# Patient Record
Sex: Male | Born: 1940 | Race: White | Hispanic: No | Marital: Married | Smoking: Former smoker
Health system: Southern US, Community
[De-identification: ages and names within clinical notes are randomized; demographics above are authoritative.]

## PROBLEM LIST (undated history)

## (undated) DIAGNOSIS — R519 Headache, unspecified: Secondary | ICD-10-CM

## (undated) DIAGNOSIS — K219 Gastro-esophageal reflux disease without esophagitis: Secondary | ICD-10-CM

## (undated) DIAGNOSIS — Z972 Presence of dental prosthetic device (complete) (partial): Secondary | ICD-10-CM

## (undated) DIAGNOSIS — IMO0002 Reserved for concepts with insufficient information to code with codable children: Secondary | ICD-10-CM

## (undated) DIAGNOSIS — Z87442 Personal history of urinary calculi: Secondary | ICD-10-CM

## (undated) DIAGNOSIS — K5900 Constipation, unspecified: Secondary | ICD-10-CM

## (undated) DIAGNOSIS — Z87438 Personal history of other diseases of male genital organs: Secondary | ICD-10-CM

## (undated) DIAGNOSIS — M1990 Unspecified osteoarthritis, unspecified site: Secondary | ICD-10-CM

## (undated) DIAGNOSIS — I499 Cardiac arrhythmia, unspecified: Secondary | ICD-10-CM

## (undated) DIAGNOSIS — R51 Headache: Secondary | ICD-10-CM

## (undated) DIAGNOSIS — F4310 Post-traumatic stress disorder, unspecified: Secondary | ICD-10-CM

## (undated) HISTORY — PX: KNEE ARTHROSCOPY: SUR90

## (undated) HISTORY — PX: CATARACT EXTRACTION, BILATERAL: SHX1313

## (undated) HISTORY — PX: COLONOSCOPY: SHX174

---

## 1969-01-09 DIAGNOSIS — R69 Illness, unspecified: Secondary | ICD-10-CM | POA: Insufficient documentation

## 1975-01-10 HISTORY — PX: INGUINAL HERNIA REPAIR: SHX194

## 1975-01-10 HISTORY — PX: APPENDECTOMY: SHX54

## 1975-01-10 HISTORY — PX: CHOLECYSTECTOMY: SHX55

## 1997-05-14 ENCOUNTER — Other Ambulatory Visit: Admission: RE | Admit: 1997-05-14 | Discharge: 1997-05-14 | Payer: Self-pay | Admitting: Orthopedic Surgery

## 1998-07-28 ENCOUNTER — Other Ambulatory Visit: Admission: RE | Admit: 1998-07-28 | Discharge: 1998-07-28 | Payer: Self-pay | Admitting: Urology

## 1998-10-05 ENCOUNTER — Ambulatory Visit (HOSPITAL_COMMUNITY): Admission: RE | Admit: 1998-10-05 | Discharge: 1998-10-05 | Payer: Self-pay | Admitting: *Deleted

## 1999-03-23 ENCOUNTER — Other Ambulatory Visit: Admission: RE | Admit: 1999-03-23 | Discharge: 1999-03-23 | Payer: Self-pay | Admitting: Urology

## 1999-06-01 ENCOUNTER — Encounter: Admission: RE | Admit: 1999-06-01 | Discharge: 1999-06-01 | Payer: Self-pay | Admitting: Urology

## 1999-06-01 ENCOUNTER — Encounter: Payer: Self-pay | Admitting: Urology

## 2000-01-24 ENCOUNTER — Encounter: Admission: RE | Admit: 2000-01-24 | Discharge: 2000-01-24 | Payer: Self-pay | Admitting: Urology

## 2000-01-24 ENCOUNTER — Encounter: Payer: Self-pay | Admitting: Urology

## 2000-11-21 ENCOUNTER — Ambulatory Visit (HOSPITAL_COMMUNITY): Admission: RE | Admit: 2000-11-21 | Discharge: 2000-11-21 | Payer: Self-pay | Admitting: *Deleted

## 2000-11-21 ENCOUNTER — Encounter (INDEPENDENT_AMBULATORY_CARE_PROVIDER_SITE_OTHER): Payer: Self-pay | Admitting: *Deleted

## 2002-01-09 HISTORY — PX: CARDIAC CATHETERIZATION: SHX172

## 2002-08-21 ENCOUNTER — Inpatient Hospital Stay (HOSPITAL_COMMUNITY): Admission: AD | Admit: 2002-08-21 | Discharge: 2002-08-23 | Payer: Self-pay | Admitting: Cardiology

## 2008-02-03 ENCOUNTER — Ambulatory Visit (HOSPITAL_COMMUNITY): Admission: RE | Admit: 2008-02-03 | Discharge: 2008-02-03 | Payer: Self-pay | Admitting: Internal Medicine

## 2009-01-09 HISTORY — PX: CERVICAL FUSION: SHX112

## 2009-04-08 ENCOUNTER — Ambulatory Visit (HOSPITAL_COMMUNITY): Admission: RE | Admit: 2009-04-08 | Discharge: 2009-04-08 | Payer: Self-pay | Admitting: Internal Medicine

## 2009-05-05 ENCOUNTER — Encounter: Admission: RE | Admit: 2009-05-05 | Discharge: 2009-05-05 | Payer: Self-pay | Admitting: Neurosurgery

## 2009-05-13 ENCOUNTER — Inpatient Hospital Stay (HOSPITAL_COMMUNITY): Admission: RE | Admit: 2009-05-13 | Discharge: 2009-05-14 | Payer: Self-pay | Admitting: Neurosurgery

## 2009-05-27 ENCOUNTER — Encounter: Admission: RE | Admit: 2009-05-27 | Discharge: 2009-05-27 | Payer: Self-pay | Admitting: Neurosurgery

## 2009-08-27 ENCOUNTER — Ambulatory Visit (HOSPITAL_COMMUNITY): Admission: RE | Admit: 2009-08-27 | Discharge: 2009-08-27 | Payer: Self-pay | Admitting: Internal Medicine

## 2010-01-09 HISTORY — PX: SUPRAPUBIC PROSTATECTOMY: SUR1056

## 2010-02-02 LAB — CBC
HCT: 41.4 % (ref 39.0–52.0)
Hemoglobin: 15.1 g/dL (ref 13.0–17.0)
MCH: 34 pg (ref 26.0–34.0)
RBC: 4.44 MIL/uL (ref 4.22–5.81)
RDW: 12.1 % (ref 11.5–15.5)
WBC: 5.9 10*3/uL (ref 4.0–10.5)

## 2010-02-02 LAB — SURGICAL PCR SCREEN
MRSA, PCR: NEGATIVE
Staphylococcus aureus: POSITIVE — AB

## 2010-02-02 LAB — BASIC METABOLIC PANEL
Creatinine, Ser: 1.09 mg/dL (ref 0.4–1.5)
GFR calc Af Amer: >60 mL/min (ref >60)

## 2010-02-08 ENCOUNTER — Other Ambulatory Visit: Payer: Self-pay | Admitting: Urology

## 2010-02-08 ENCOUNTER — Inpatient Hospital Stay (HOSPITAL_COMMUNITY)
Admission: RE | Admit: 2010-02-08 | Discharge: 2010-02-10 | DRG: 708 | Disposition: A | Discharge status: Home / Self Care | Payer: MEDICARE | Attending: Urology | Admitting: Urology

## 2010-02-08 DIAGNOSIS — N320 Bladder-neck obstruction: Secondary | ICD-10-CM | POA: Diagnosis present

## 2010-02-08 DIAGNOSIS — G4730 Sleep apnea, unspecified: Secondary | ICD-10-CM | POA: Diagnosis present

## 2010-02-08 DIAGNOSIS — N419 Inflammatory disease of prostate, unspecified: Secondary | ICD-10-CM | POA: Diagnosis present

## 2010-02-08 DIAGNOSIS — R972 Elevated prostate specific antigen [PSA]: Secondary | ICD-10-CM | POA: Diagnosis present

## 2010-02-08 DIAGNOSIS — N529 Male erectile dysfunction, unspecified: Secondary | ICD-10-CM | POA: Diagnosis present

## 2010-02-08 DIAGNOSIS — N401 Enlarged prostate with lower urinary tract symptoms: Principal | ICD-10-CM | POA: Diagnosis present

## 2010-02-08 DIAGNOSIS — N3289 Other specified disorders of bladder: Secondary | ICD-10-CM | POA: Diagnosis not present

## 2010-02-08 DIAGNOSIS — K219 Gastro-esophageal reflux disease without esophagitis: Secondary | ICD-10-CM | POA: Diagnosis present

## 2010-02-08 LAB — TYPE AND SCREEN
ABO/RH(D): A NEG
Antibody Screen: NEGATIVE

## 2010-02-08 LAB — ABO/RH: ABO/RH(D): A NEG

## 2010-02-08 LAB — HEMOGLOBIN AND HEMATOCRIT, BLOOD
HCT: 33.5 % — ABNORMAL LOW (ref 39.0–52.0)
Hemoglobin: 12.1 g/dL — ABNORMAL LOW (ref 13.0–17.0)

## 2010-02-23 NOTE — Op Note (Signed)
Tommy Hobbs, DEBRUYNE NO.:  [id number]  MEDICAL RECORD NO.:  [id number]          PATIENT TYPE:  INP  LOCATION:  0005                         FACILITY:  [facility]  PHYSICIAN:  Excell Seltzer. Annabell Howells, M.D.    DATE OF BIRTH:  Mar 06, 1940  DATE OF PROCEDURE:  02/08/2010 DATE OF DISCHARGE:                              OPERATIVE REPORT   PROCEDURE:  Suprapubic prostatectomy.  PREOPERATIVE DIAGNOSIS:  Benign prostatic hypertrophy with bladder outlet obstruction.  POSTOPERATIVE DIAGNOSIS:  Benign prostatic hypertrophy with bladder outlet obstruction.  SURGEON:  Excell Seltzer. Annabell Howells, M.D.  ASSISTANT:  Tommy Hobbs. Tommy Hobbs, M.D.  ANESTHESIA:  General.  DRAIN:  24-French urethral Foley, 22-French suprapubic tube and 10- [place] flat Blake drain.  BLOOD LOSS:  450 cc.  SPECIMENS:  Prostate adenoma.  COUNTS:  Sponge, instrument and needle counts were correct.  COMPLICATIONS:  None.  INDICATIONS:  Tommy Hobbs is a 70 year old white male with BPH and bladder outlet obstruction as well as some chronic perineal pain.  In his last office visit in late December, his prostate symptom score was 31 and his residual urine was 370 cc with a prostate volume estimated at 147 cc.  This was despite finasteride and terazosin for his voiding symptoms.  After reviewing the options, it was felt that an open simple prostatectomy was indicated.  FINDINGS IN PROCEDURE:  He was given Cipro.  He was taken to the operating room where he was placed in the supine position.  A general anesthetic was induced.  PAS hoses were placed.  His abdomen was clipped.  He was prepped with Betadine solution and draped in usual sterile fashion.  A 24-French urethral Foley was then inserted.  The bladder was filled with approximately 200 cc of sterile fluid.  A lower midline incision was made with a knife.  This was carried down through the subcutaneous and fascial layers using the Bovie. Transversalis fascia was incised  and the anterior bladder wall was identified.  A Bookwalter retractor was placed to aid exposure.  An anterior midline cystotomy was created and the bladder was drained.  The Foley catheter was removed.  A sponge was placed in the dome of the bladder.  A sweetheart retractor was used to provide traction.  Lateral stitches with 0 chromic were used to provide further exposure.  Ureteral orifices were identified and were felt to be well away from the bladder neck.  The Bovie cautery was then used to incise the mucosa around the prostatic adenoma and to avoid tearing into the trigone.  At this point, a finger was placed in the prostatic urethra and the adenoma was cracked and then bluntly dissected away from the prostatic capsule.  The muscular mucosal attachments were incised with Bovie as required.  Once the right and left adenoma had been removed, some residual bladder neck adenoma was removed as well as some residual right lateral adenoma.  At this point, it was felt that the adenoma had been adequately removed and we then moved toward hemostasis.  The bladder neck incision was oversewn from approximately 4 to 8 o'clock using running 2-0 Vicryl with care  being taken to avoid ureteral orifices which were inspected and were effluxing clear urine after we removed the adenoma.  Additional 2-0 chromic figure-of-eight sutures were used to control points of bleeding within the prostatic fossa.  However, bleeding was actually fairly light for this type of procedure.  Once adequate hemostasis had been achieved, the 24-French Foley catheter was reinserted per urethra.  The balloon was filled with 30 cc of sterile fluid and actually pulled into the prostatic fossa to provide additional tamponade.  At this point, final inspection of the ureteral orifices revealed further efflux of clear urine.  A 22-French Foley catheter was placed through a separate stab wound to the left of the incision to be  used as a suprapubic tube.  The anterior cystotomy was then closed with an initial mucosal layer of 2-0 chromic with the suprapubic tube being placed prior to completion of the closure.  The balloon was filled with 30 cc of sterile fluid.  A second seromuscular layer was then placed using 2-0 chromic.  Once the cystotomy had been completed, the bladder was irrigated with initial bloody urine that cleared with irrigation.  The urethral catheter was clamped and filling was continued to check water tightness of the cystotomy closure.  No leakage was identified.  The urethral catheter was placed to straight drainage, and the suprapubic catheter was eventually placed to continuous irrigation.  A #10 flat Blake drain was placed through separate stab wound to the right of the incision in positioned in the retropubic space.  The anterior rectus fascia was then closed using a running #1 PDS.  The subcutaneous tissues were irrigated, and the skin was closed with clips.  The drain and suprapubic tube were secured with 3-0 Nylon sutures and a dressing was applied.  The patient's anesthetic was reversed.  He was moved to the recovery room in stable condition.  The blood loss was 450 cc.  Sponge, instrument and needle counts were correct.  There were no complications.     Excell Seltzer. Annabell Howells, M.D.     JJW/MEDQ  D:  02/08/2010  T:  02/08/2010  Job:  [id number]  cc:   Margaretmary Bayley, M.D. Fax: [phone]  Electronically Signed by Bjorn Pippin M.D. on 02/23/2010 09:25:07 AM

## 2010-03-29 LAB — BASIC METABOLIC PANEL
BUN: 12 mg/dL (ref 6–23)
CO2: 26 mEq/L (ref 19–32)
Calcium: 9.5 mg/dL (ref 8.4–10.5)
Chloride: 106 mEq/L (ref 96–112)
Creatinine, Ser: 1.12 mg/dL (ref 0.4–1.5)
GFR calc Af Amer: >60 mL/min (ref >60)
GFR calc non Af Amer: >60 mL/min (ref >60)
Glucose, Bld: 110 mg/dL — ABNORMAL HIGH (ref 70–99)
Potassium: 4.2 mEq/L (ref 3.5–5.1)
Sodium: 138 mEq/L (ref 135–145)

## 2010-03-29 LAB — CBC
HCT: 41.4 % (ref 39.0–52.0)
Hemoglobin: 14.5 g/dL (ref 13.0–17.0)
MCHC: 35 g/dL (ref 30.0–36.0)
MCV: 99.8 fL (ref 78.0–100.0)
Platelets: 191 10*3/uL (ref 150–400)
RBC: 4.15 MIL/uL — ABNORMAL LOW (ref 4.22–5.81)
RDW: 12.9 % (ref 11.5–15.5)
WBC: 5.7 10*3/uL (ref 4.0–10.5)

## 2010-03-29 LAB — SURGICAL PCR SCREEN
MRSA, PCR: NEGATIVE
Staphylococcus aureus: POSITIVE — AB

## 2010-05-27 NOTE — Discharge Summary (Signed)
NAME:  Tommy Hobbs, Tommy Hobbs                         ACCOUNT NO.:  [id number]   MEDICAL RECORD NO.:  [id number]                   PATIENT TYPE:  INP   LOCATION:  3728                                 FACILITY:  [facility]   PHYSICIAN:  Armanda Magic, M.D.                  DATE OF BIRTH:  April 30, 1940   DATE OF ADMISSION:  08/21/2002  DATE OF DISCHARGE:  08/23/2002                                 DISCHARGE SUMMARY   CONSULTANTS:  Doylene Canning. Ladona Ridgel, M.D., [facility].   PROCEDURE:  August 22, 2002:  Coronary angiography by Armanda Magic, M.D.  revealing normal coronary arteries, normal left ventricular function.   DISCHARGE DIAGNOSES:  1. Chest pain, atypical.  Not related to coronary artery disease.     a. Cardiac catheterization August 22, 2002 by Armanda Magic, M.D.        revealing no significant coronary artery disease, normal left        ventricular function.  2. Paroxysmal symptomatic tachy palpitations documented both narrow as well     as wide QRS rhythms.     a. Evaluation by Doylene Canning. Ladona Ridgel, M.D., [facility].  Felt        supraventricular tachycardia sometimes with and sometimes without        aberrant conduction, though could have both supraventricular        tachycardia as well as ventricular tachycardia, both of which have        been nonsustained.  Recommended initiation of beta blocker therapy.        If continues symptomatic palpitations despite beta blockers or is        unable to tolerate those drugs, then consideration of catheter        ablation would be an option.  3. Hyperlipidemia, recent diagnosis.  4. History of gastroesophageal reflux disease, Barrett's esophagus followed     by Althea Grimmer. Santogade, M.D. in the past.  5. Benign prostatic hypertrophy.   PLAN:  The patient discharged home in stable condition.   DISCHARGE MEDICATIONS:  1. Toprol XL 50 mg p.o. daily.  2. Enteric-coated aspirin 81 mg daily.  3. Lipitor 40 mg p.o. q.h.s.  4.  Aciphex 20 mg p.o. q.h.s.  5. Fish oil.  6. Saw palmetto.   ACTIVITY:  As tolerated.   DIET:  Avoid caffeine, antihistamines.   WOUND CARE:  May shower.   FOLLOWUPArmanda Magic, M.D. to be seen in our clinic in approximately two  weeks.  The patient to call [phone] to schedule appointment.   HISTORY OF PRESENT ILLNESS:  Tommy Hobbs is a 70 year old gentleman  without prior cardiac history who presented office referral to Armanda Magic,  M.D. for evaluation of palpitations and chest pain.   The patient had onset approximately two weeks prior to presentation of  palpitations while on prednisone therapy taper for poison ivy.  Palpitations  were without  lightheadedness and of variable duration, maximum of three to  four minutes with several bursts.  The patient with associated sharp chest  pain along sternum.  No associated symptoms.  Palpitations resolved once  prednisone taper completed but residual chest crushing/tightness for last  few days prior to presentation.  Holter monitor August 15, 2002 revealed  several episodes ectopy and arrhythmia, some PSVT and some nonsustained  ventricular tachycardia.  The patient was electively admitted for coronary  angiography.  This was accomplished by Armanda Magic, M.D. revealing no  significant coronary artery disease, normal LV function.  EP evaluation by  Doylene Canning. Ladona Ridgel, M.D. was retained with results as detailed above.   PAST MEDICAL HISTORY:  As above.   PAST SURGICAL HISTORY:  1. Cholecystectomy.  2. Appendectomy.  3. Hernia repair in 1977.  4. Knee surgery.   LABORATORY DATA:  WBC 8, hemoglobin 16.1, hematocrit 46.5, platelets  184,000.  PT 12.2, INR 0.9, PTT 26.  Sodium 139, potassium 3.8, chloride  104, CO2 30, glucose 98, BUN 11, creatinine 1.0, calcium 8.9, total protein  6.5, albumin 3.7.  LFTs within normal range with only slight elevation ALT  of 42.  Serial cardiac enzymes including CK-MB and troponin I were  negative  x3 sets.  TSH within normal range at 1.552.      Salomon Fick, N.P.                       Armanda Magic, M.D.    MES/MEDQ  D:  09/07/2002  T:  09/08/2002  Job:  [id number]   cc:   Meredith Staggers, M.D.  [address], [address]  [place]  [place] [address]  Fax: [phone]   Doylene Canning. Ladona Ridgel, M.D.

## 2010-05-27 NOTE — Consult Note (Signed)
NAME:  Tommy Hobbs, Tommy Hobbs                         ACCOUNT NO.:  [id number]   MEDICAL RECORD NO.:  [id number]                   PATIENT TYPE:  INP   LOCATION:  3728                                 FACILITY:  [facility]   PHYSICIAN:  Doylene Canning. Ladona Ridgel, M.D.               DATE OF BIRTH:  Jun 29, 1940   DATE OF CONSULTATION:  08/23/2002  DATE OF DISCHARGE:                                   CONSULTATION   REFERRING PHYSICIAN:  Armanda Magic, M.D.   INDICATIONS FOR CONSULTATION:  Evaluation of both wide and narrow QRS  tachycardias associated with chest pain.   HISTORY OF PRESENT ILLNESS:  The patient is a very pleasant 70 year old man  who recently developed tachy palpitations associated with chest pain in the  last year.  He was under increasing distress over the last several weeks as  a consequence of his wife being critically ill.  He developed a rash on his  arms and was treated with oral steroids.  He seemed to have increasing  palpitations and chest pain associated with this and a cardiac monitor was  worn which demonstrates recurrent episodes of both wide and narrow QRS  tachycardias.  These tend to start and stop suddenly.  They are associated  with chest tightness but no syncope or shortness of breath.  The longest  episode the patient states lasted approximately three minutes.  In the last  week, since he has been off his steroid, his symptoms have improved.  He is  admitted for additional evaluation of his chest pain and he subsequently  underwent catheterization demonstrating no coronary artery disease and  preserved left ventricular systolic function.  He is referred now for  additional evaluation and treatment.  Review of his telemetry strips  demonstrate recurrent episodes of what appears to be a narrow QRS  tachycardia at a cycle length of approximately 300 msec.  His wide QRS  tachycardia interestingly enough has cycle lengths of 320-340 msec.  At  times there is a transition from  wide to narrow QRS arrhythmias.  All the  above suggest bundle branch aberration as the cause of his wide QRS beats,  although we certainly cannot exclude nonsustained ventricular tachycardia.   PAST MEDICAL HISTORY:  1. Dyslipidemia.  2. Gastroesophageal reflux disease.  3. History of BPH.  4. He is status post cholecystectomy, appendectomy and hernia repair.   SOCIAL HISTORY:  The patient is married.  He denies tobacco or ethanol abuse  presently but prior to seven years ago, smoked 2-1/2 packs of cigarettes per  day.   FAMILY HISTORY:  Notable for mother dying at age 21 and father dying of  colon cancer, unknown causes.  He has a brother with hypertension.   REVIEW OF SYSTEMS:  CONSTITUTIONAL:  Notable for no fevers, chills, night  sweats, or weight changes.  HEENT:  He denies any vision or hearing  problems.  SKIN:  He had a skin rash as previously noted on both arms  thought to be secondary to contact dermatitis. CARDIOVASCULAR:  He notes  chest pain and shortness of breath and palpitations as previously described.  RESPIRATORY:  He denies cough, claudication, or wheezing.  He denies PND or  edema.  GENITOURINARY:  He denies dysuria, hematuria or nocturia.  NEUROLOGIC:  He denies any numbness, depression or anxiety.  He does have  some very mild weakness.  GI:  He denies nausea, vomiting, diarrhea, or  constipation.  The rest of the review of systems was negative.   PHYSICAL EXAMINATION:  GENERAL:  He is a pleasant, well-appearing middle-  aged man in no distress.  VITAL SIGNS:  Blood pressure 120/70,  pulse 78 and regular, respirations 18.  HEENT:  Normocephalic, atraumatic.  Pupils are equal and round.  Oropharynx  moist.  Sclerae anicteric.  NECK:  No jugular venous distension.  There was no thyromegaly.  The trachea  was midline.  LUNGS:  Clear bilaterally to auscultation.  There were no wheezes, rales or  rhonchi.  ABDOMEN:  Soft, nontender, nondistended.  There was no  organomegaly.  EXTREMITIES:  Demonstrated no cyanosis, clubbing or edema.  Pulses 2+ and  symmetrical.  No peripheral edema.   EKG:  Demonstrates normal sinus rhythm without evidence of ventricular  preexcitation.   IMPRESSION:  1. Paroxysmal symptomatic tachy palpitations consistent with documentation     of both narrow as well as wide QRS rhythms.  2. Chest pain associated with #1 with no obstruction coronary disease.   DISCUSSION:  As the patient has not been on medical therapy and temporarily  the symptoms were worse with prednisone, which he has now stopped, I think  low-dose beta blocker initially for management of his palpitations and  tachycardia make the most sense.  Most likely he has SVT sometimes with and  sometimes without aberrant conduction, although he could certainly have both  SVT as well as VT, both of which have been nonsustained.  I have recommended  that he start on his beta blocker therapy.  If he has symptomatic  palpitations despite beta blockers or is unable to tolerate these drugs,  then consideration of catheter ablation would be an option.                                               Doylene Canning. Ladona Ridgel, M.D.    GWT/MEDQ  D:  08/23/2002  T:  08/23/2002  Job:  [id number]   cc:   Armanda Magic, M.D.  [address], [address]  [place], [place] [address]  Fax: [phone]   Kathrine Cords, R.N. [facility]

## 2010-05-27 NOTE — Cardiovascular Report (Signed)
NAME:  Tommy Hobbs, NEIDERT NO.:  [id number]   MEDICAL RECORD NO.:  [id number]                   PATIENT TYPE:  INP   LOCATION:  3728                                 FACILITY:  [facility]   PHYSICIAN:  Armanda Magic, M.D.                  DATE OF BIRTH:  January 07, 1941   DATE OF PROCEDURE:  08/22/2002  DATE OF DISCHARGE:                              CARDIAC CATHETERIZATION   REFERRING PHYSICIAN:  Meredith Staggers, M.D.   PROCEDURES:  1. Left heart catheterization.  2. Coronary angiography.  3. Left ventriculography.   OPERATOR:  Armanda Magic, M.D.   INDICATIONS:  Ventricular tachycardia, SVT, and chest pain.   COMPLICATIONS:  None.   INTRAVENOUS MEDICATIONS:  None.   INTRAVENOUS ACCESS:  Via 6 French sheath, right femoral artery.   This is a very pleasant 70 year old white male with no previous cardiac  history, who has been having episodic chest pain along with palpitations,  which recently significantly increased after being put on steroids.  He wore  a Holter monitor, which revealed multiple episodes of SVT and wide-complex  tachycardia, and now he presents for cardiac catheterization to rule out  inducible ischemia.   The patient was brought to the cardiac catheterization in the fasting,  nonsedated state.  Informed consent was obtained.  The patient was connected  to continuous heart rate and pulse oximetry monitoring, intermittent blood  pressure monitoring.  The right groin was prepped and draped in a sterile  fashion.  Xylocaine 1% was used for local anesthesia.  Using the modified  Seldinger technique a 6  French sheath was placed in the right femoral  artery.  Under fluoroscopic guidance a 6 [place] JL4 catheter was placed in  the left coronary artery.  Multiple cine films were taken in the 30 degree  RAO and 40 degree LAO views.  This catheter was then exchanged out over a  guidewire for a 6 [place] JR4 catheter, which was placed under  fluoroscopic  guidance into the right coronary artery.  Multiple cine films were taken in  the 30 degree RAO, 40 degree LAO views.  This catheter was then exchanged  out over a guidewire for a 6 French angled pigtail catheter, which was  placed under fluoroscopic guidance into the left ventricular cavity.  Left  ventriculography was performed in a 30 degree RAO view using a total of 30  mL of contrast at  13 mL/sec.  At the end of the procedure the catheter was  pulled across the aortic valve.  The catheter was then removed over a  guidewire.  At the end of the procedure all sheaths were removed.  Manual  compression was performed until adequate hemostasis was obtained.  The  patient was transferred back to his room in stable condition.   RESULTS:  1. Left main coronary artery is widely patent and trifurcates into a left  anterior descending artery, ramus branch, and left circumflex.  2. The left anterior descending artery is widely patent throughout its     course to the apex and gives rise to one large diagonal branch, which is     widely patent.  The ramus is a smaller-caliber vessel but is widely     patent throughout its course and bifurcates distally to two daughter     vessels, which were widely patent.  3. The left circumflex traverses the AV groove and gives rise to one obtuse     marginal branch, all of which are widely patent.  4. The right coronary artery is widely patent throughout its course, giving     rise distally to a posterolateral branch and posterior descending artery,     both of which are widely patent.   Left ventriculography was performed in a 30 degree RAO view using a total of  30 mL of contrast at 13 mL/sec.  The aortic pressure is 112/67 mmHg.  Left  ventricular pressure was 124/2 mmHg with an LVEDP of 14.  Left ventricular  systolic function appeared normal and normal left ventricular cavity  dimensions.   ASSESSMENT:  1. Noncardiac chest pain with  negative cardiac enzymes.  2. Normal coronary arteries.  3. Wide complex tachycardia with supraventricular tachycardia.  Question     whether this is an accessory bypass tract with antegrade and retrograde     conduction over the accessory tract versus supraventricular tachycardia     and ventricular tachycardia versus supraventricular tachycardia with     narrow-complex as well as aberrantly-conducted QRS complexes.  4. Hyperlipidemia.   PLAN:  1. IV fluids hydration.  2. EP consult.  3. Will probably discharge home today pending the EP evaluation and follow     up with me in two weeks for groin check.                                               Armanda Magic, M.D.    TT/MEDQ  D:  08/22/2002  T:  08/22/2002  Job:  [id number]   cc:   Doylene Canning. Ladona Ridgel, M.D.   Meredith Staggers, M.D.  [address]  [place]  [place] [address]  Fax: [phone]

## 2010-09-28 ENCOUNTER — Emergency Department (HOSPITAL_COMMUNITY): Payer: No Typology Code available for payment source

## 2010-09-28 ENCOUNTER — Emergency Department (HOSPITAL_COMMUNITY)
Admission: EM | Admit: 2010-09-28 | Discharge: 2010-09-28 | Disposition: A | Discharge status: Home / Self Care | Payer: No Typology Code available for payment source | Attending: Surgery | Admitting: Surgery

## 2010-09-28 DIAGNOSIS — S8000XA Contusion of unspecified knee, initial encounter: Secondary | ICD-10-CM | POA: Insufficient documentation

## 2011-02-17 ENCOUNTER — Encounter (HOSPITAL_COMMUNITY): Payer: Self-pay

## 2011-02-17 ENCOUNTER — Emergency Department (HOSPITAL_COMMUNITY)
Admission: EM | Admit: 2011-02-17 | Discharge: 2011-02-17 | Disposition: A | Discharge status: Home / Self Care | Payer: Medicare Other | Attending: Emergency Medicine | Admitting: Emergency Medicine

## 2011-02-17 DIAGNOSIS — S60459A Superficial foreign body of unspecified finger, initial encounter: Secondary | ICD-10-CM | POA: Insufficient documentation

## 2011-02-17 DIAGNOSIS — M795 Residual foreign body in soft tissue: Secondary | ICD-10-CM | POA: Insufficient documentation

## 2011-02-17 DIAGNOSIS — IMO0002 Reserved for concepts with insufficient information to code with codable children: Secondary | ICD-10-CM | POA: Insufficient documentation

## 2011-02-17 DIAGNOSIS — T148XXA Other injury of unspecified body region, initial encounter: Secondary | ICD-10-CM

## 2011-02-17 DIAGNOSIS — Z1833 Retained wood fragments: Secondary | ICD-10-CM | POA: Insufficient documentation

## 2011-02-17 NOTE — ED Provider Notes (Signed)
History     CSN: [id number]  Arrival date & time 02/17/11  1601   First MD Initiated Contact with Patient 02/17/11 1619      No chief complaint on file.   (Consider location/radiation/quality/duration/timing/severity/associated sxs/prior treatment) HPI Comments: Patient states that he was working with wood today without gloves and states that he pulled up a board and a splinter went into his medial thumb - has tried to get this out and was unable.  Patient is a 71 y.o. male presenting with foreign body. The history is provided by the patient. No language interpreter was used.  Foreign Body  The current episode started less than 1 hour ago. Intake: right thumb. Suspected object: splinter of wood. The incident was witnessed. The incident was witnessed/reported by the patient. Pertinent negatives include no chest pain, no fever, no abdominal pain, no vomiting, no congestion, no drainage, no hearing loss, no nosebleeds, no sore throat, no trouble swallowing, no choking, no cough and no difficulty breathing. There were no sick contacts. He has received no recent medical care.    No past medical history on file.  No past surgical history on file.  No family history on file.  History  Substance Use Topics  . Smoking status: Not on file  . Smokeless tobacco: Not on file  . Alcohol Use: Not on file      Review of Systems  Constitutional: Negative for fever.  HENT: Negative for nosebleeds, congestion, sore throat and trouble swallowing.   Respiratory: Negative for cough and choking.   Cardiovascular: Negative for chest pain.  Gastrointestinal: Negative for vomiting and abdominal pain.  All other systems reviewed and are negative.    Allergies  Cocamidopropyl betaine; Nexium; and Tegopen  Home Medications   Current Outpatient Rx  Name Route Sig Dispense Refill  . ADULT MULTIVITAMIN W/MINERALS CH Oral Take 1 tablet by mouth daily.    . OMEGA-3-ACID ETHYL ESTERS 1 G PO CAPS  Oral Take 2 g by mouth 2 (two) times daily.    Marland Kitchen OMEPRAZOLE 20 MG PO CPDR Oral Take 20 mg by mouth daily.      BP 131/74  Pulse 80  Temp(Src) 98.1 F (36.7 C) (Oral)  Resp 14  SpO2 97%  Physical Exam  Nursing note and vitals reviewed. Constitutional: He is oriented to person, place, and time. He appears well-developed and well-nourished. No distress.  HENT:  Head: Normocephalic and atraumatic.  Right Ear: External ear normal.  Left Ear: External ear normal.  Nose: Nose normal.  Mouth/Throat: Oropharynx is clear and moist.  Eyes: Conjunctivae are normal. Pupils are equal, round, and reactive to light. No scleral icterus.  Neck: Normal range of motion. Neck supple.  Cardiovascular: Normal rate, regular rhythm and normal heart sounds.  Exam reveals no gallop and no friction rub.   No murmur heard. Pulmonary/Chest: Effort normal and breath sounds normal. No respiratory distress.  Abdominal: Soft. Bowel sounds are normal. He exhibits distension.  Musculoskeletal: Normal range of motion.  Neurological: He is alert and oriented to person, place, and time. No cranial nerve deficit.  Skin: Skin is warm and dry.       Small splinter in left medial thumb  Psychiatric: He has a normal mood and affect. His behavior is normal. Judgment and thought content normal.    ED Course  Procedures (including critical care time)  Labs Reviewed - No data to display No results found.  Injected 0.5cc 2% lidocaine without epi into splinter site, #11  blade used and small particles of splinter removed, patient tolerated well.  Foreign body in left thumb    MDM  Splinter removed - patient tolerated well.        Izola Price [place], [place] 02/17/11 1742  Izola Price. [place], [place] 02/17/11 1743

## 2011-02-19 NOTE — ED Provider Notes (Signed)
Medical screening examination/treatment/procedure(s) were performed by non-physician practitioner and as supervising physician I was immediately available for consultation/collaboration.   Forbes Cellar, MD 02/19/11 [phone]

## 2013-06-25 ENCOUNTER — Other Ambulatory Visit: Payer: Self-pay | Admitting: Orthopedic Surgery

## 2013-06-25 DIAGNOSIS — R102 Pelvic and perineal pain: Secondary | ICD-10-CM

## 2013-06-30 ENCOUNTER — Ambulatory Visit
Admission: RE | Admit: 2013-06-30 | Discharge: 2013-06-30 | Disposition: A | Discharge status: Home / Self Care | Payer: Medicare Other | Source: Ambulatory Visit | Attending: Orthopedic Surgery | Admitting: Orthopedic Surgery

## 2013-06-30 DIAGNOSIS — R102 Pelvic and perineal pain: Secondary | ICD-10-CM

## 2013-06-30 MED ORDER — IOHEXOL 300 MG/ML  SOLN
125.0000 mL | Freq: Once | INTRAMUSCULAR | Status: AC | PRN
  Administered 2013-06-30: 125 mL via INTRAVENOUS

## 2013-07-17 ENCOUNTER — Encounter (INDEPENDENT_AMBULATORY_CARE_PROVIDER_SITE_OTHER): Payer: Self-pay | Admitting: General Surgery

## 2013-07-17 ENCOUNTER — Ambulatory Visit (INDEPENDENT_AMBULATORY_CARE_PROVIDER_SITE_OTHER): Payer: Medicare Other | Admitting: General Surgery

## 2013-07-17 VITALS — BP 132/78 | HR 62 | Temp 97.5°F | Ht 72.0 in | Wt 195.0 lb

## 2013-07-17 DIAGNOSIS — K4090 Unilateral inguinal hernia, without obstruction or gangrene, not specified as recurrent: Secondary | ICD-10-CM

## 2013-07-17 NOTE — Progress Notes (Signed)
Patient ID: Tommy Hobbs, male   DOB: Dec 16, 1940, 73 y.o.   MRN: [id number]  Chief Complaint  Patient presents with  . eval hernia    HPI Tommy Hobbs is a 73 y.o. male.  Referred by Dr. Jenny Reichmann free for left inguinal hernia. [name] is his PCP.  The patient states that he has had left groin pain for 15 years. Doesn't feel a bulge. He says this feels just like the right inguinal hernia that he had repaired in 1977.  A CT scan shows small left inguinal hernia containing fat. Aortic calcifications. Evidence of previous TURP.  The patient has a history of retrograde TURP through the bladder in the year 2012. [place]. He did not have cancer but they could not do it retrograde transurethrally and had to make an incision in the lower abdomen and into the bladder to open things up. He has been better since then he's also had an open cholecystectomy. He has left shoulder problems. He has diastases recti. HPI  History reviewed. No pertinent past medical history.  History reviewed. No pertinent past surgical history.  History reviewed. No pertinent family history.  Social History History  Substance Use Topics  . Smoking status: Never Smoker   . Smokeless tobacco: Not on file  . Alcohol Use: No    Allergies  Allergen Reactions  . Cocamidopropyl Betaine Hives and Rash  . Esomeprazole Magnesium Hives and Rash  . Tegopen [Cloxacillin Sodium] Rash    Current Outpatient Prescriptions  Medication Sig Dispense Refill  . Multiple Vitamin (MULITIVITAMIN WITH MINERALS) TABS Take 1 tablet by mouth daily.      Marland Kitchen omega-3 acid ethyl esters (LOVAZA) 1 G capsule Take 2 g by mouth 2 (two) times daily.      Marland Kitchen omeprazole (PRILOSEC) 20 MG capsule Take 20 mg by mouth daily.       No current facility-administered medications for this visit.    Review of Systems Review of Systems  Constitutional: Negative for fever, chills and unexpected weight change.  HENT: Negative for congestion,  hearing loss, sore throat, trouble swallowing and voice change.   Eyes: Negative for visual disturbance.  Respiratory: Negative for cough and wheezing.   Cardiovascular: Negative for chest pain, palpitations and leg swelling.  Gastrointestinal: Negative for nausea, vomiting, abdominal pain, diarrhea, constipation, blood in stool, abdominal distention, anal bleeding and rectal pain.  Genitourinary: Positive for frequency. Negative for hematuria and difficulty urinating.  Musculoskeletal: Positive for arthralgias, myalgias and neck stiffness.  Skin: Negative for rash and wound.  Neurological: Negative for seizures, syncope, weakness and headaches.  Hematological: Negative for adenopathy. Does not bruise/bleed easily.  Psychiatric/Behavioral: Negative for confusion.    Blood pressure 132/78, pulse 62, temperature 97.5 F (36.4 C), height 6' (1.829 m), weight 195 lb (88.451 kg).  Physical Exam Physical Exam  Constitutional: He is oriented to person, place, and time. He appears well-developed and well-nourished. No distress.  Very pleasant. friendly  HENT:  Head: Normocephalic.  Nose: Nose normal.  Mouth/Throat: No oropharyngeal exudate.  Eyes: Conjunctivae and EOM are normal. Pupils are equal, round, and reactive to light. Right eye exhibits no discharge. Left eye exhibits no discharge. No scleral icterus.  Neck: Normal range of motion. Neck supple. No JVD present. No tracheal deviation present. No thyromegaly present.  Left neck incision from fusion surgery.  Cardiovascular: Normal rate, regular rhythm, normal heart sounds and intact distal pulses.   No murmur heard. Pulmonary/Chest: Effort normal and breath sounds normal. No  stridor. No respiratory distress. He has no wheezes. He has no rales. He exhibits no tenderness.  Abdominal: Soft. Bowel sounds are normal. He exhibits no distension and no mass. There is no tenderness. There is no rebound and no guarding.  Diastases recti. Pinpoint  umbilical hernia, nontender. Right subcostal incision healed. Lower midline incision healed. Cannot see a right groin incision.  Genitourinary:  With standing I can feel a small left inguinal hernia and he is reproducibly tender at that point. He says this is the same pain he has been having. No evidence of hernia on the right. No scrotal mass. Testes smooth but a little small.  Musculoskeletal: Normal range of motion. He exhibits no edema and no tenderness.  Lymphadenopathy:    He has no cervical adenopathy.  Neurological: He is alert and oriented to person, place, and time. He has normal reflexes. Coordination normal.  Skin: Skin is warm and dry. No rash noted. He is not diaphoretic. No erythema. No pallor.  Psychiatric: He has a normal mood and affect. His behavior is normal. Judgment and thought content normal.    Data Reviewed CT scan.  Assessment    Painful left inguinal hernia. Small. Contains fat only. Symptoms and physical exam correlates with CT scan findings  Remote history right inguinal hernia repair, and a recurrence  Status post open cholecystectomy  Status post open, retrograde prostate operation for BPH  GERD     Plan    At the patient's request, he will be scheduled for open repair of left inguinal hernia with mesh.  I discussed the indications, details, techniques, numerous risks of the surgery with him. He is aware of the risk of bleeding, infection, recurrence, injury to adjacent organs, nerve damage with chronic pain, and other understanding problems. He he understands all these issues and all his questions are ansswered.  He agrees with this plan.        Gisell Buehrle M 07/17/2013, 11:32 AM

## 2013-07-17 NOTE — Patient Instructions (Signed)
You have had left groin pain for many years. On physical exam and CT scan you have a small left inguinal hernia.  You'll be scheduled for elective repair of your left inguinal hernia with mesh.       Inguinal Hernia, Adult Muscles help keep everything in the body in its proper place. But if a weak spot in the muscles develops, something can poke through. That is called a hernia. When this happens in the lower part of the belly (abdomen), it is called an inguinal hernia. (It takes its name from a part of the body in this region called the inguinal canal.) A weak spot in the wall of muscles lets some fat or part of the small intestine bulge through. An inguinal hernia can develop at any age. Men get them more often than women. CAUSES  In adults, an inguinal hernia develops over time.  It can be triggered by:  Suddenly straining the muscles of the lower abdomen.  Lifting heavy objects.  Straining to have a bowel movement. Difficult bowel movements (constipation) can lead to this.  Constant coughing. This may be caused by smoking or lung disease.  Being overweight.  Being pregnant.  Working at a job that requires long periods of standing or heavy lifting.  Having had an inguinal hernia before. One type can be an emergency situation. It is called a strangulated inguinal hernia. It develops if part of the small intestine slips through the weak spot and cannot get back into the abdomen. The blood supply can be cut off. If that happens, part of the intestine may die. This situation requires emergency surgery. SYMPTOMS  Often, a small inguinal hernia has no symptoms. It is found when a healthcare provider does a physical exam. Larger hernias usually have symptoms.   In adults, symptoms may include:  A lump in the groin. This is easier to see when the person is standing. It might disappear when lying down.  In men, a lump in the scrotum.  Pain or burning in the groin. This occurs  especially when lifting, straining or coughing.  A dull ache or feeling of pressure in the groin.  Signs of a strangulated hernia can include:  A bulge in the groin that becomes very painful and tender to the touch.  A bulge that turns red or purple.  Fever, nausea and vomiting.  Inability to have a bowel movement or to pass gas. DIAGNOSIS  To decide if you have an inguinal hernia, a healthcare provider will probably do a physical examination.  This will include asking questions about any symptoms you have noticed.  The healthcare provider might feel the groin area and ask you to cough. If an inguinal hernia is felt, the healthcare provider may try to slide it back into the abdomen.  Usually no other tests are needed. TREATMENT  Treatments can vary. The size of the hernia makes a difference. Options include:  Watchful waiting. This is often suggested if the hernia is small and you have had no symptoms.  No medical procedure will be done unless symptoms develop.  You will need to watch closely for symptoms. If any occur, contact your healthcare provider right away.  Surgery. This is used if the hernia is larger or you have symptoms.  Open surgery. This is usually an outpatient procedure (you will not stay overnight in a hospital). An cut (incision) is made through the skin in the groin. The hernia is put back inside the abdomen. The weak  area in the muscles is then repaired by herniorrhaphy or hernioplasty. Herniorrhaphy: in this type of surgery, the weak muscles are sewn back together. Hernioplasty: a patch or mesh is used to close the weak area in the abdominal wall.  Laparoscopy. In this procedure, a surgeon makes small incisions. A thin tube with a tiny video camera (called a laparoscope) is put into the abdomen. The surgeon repairs the hernia with mesh by looking with the video camera and using two long instruments. HOME CARE INSTRUCTIONS   After surgery to repair an inguinal  hernia:  You will need to take pain medicine prescribed by your healthcare provider. Follow all directions carefully.  You will need to take care of the wound from the incision.  Your activity will be restricted for awhile. This will probably include no heavy lifting for several weeks. You also should not do anything too active for a few weeks. When you can return to work will depend on the type of job that you have.  During "watchful waiting" periods, you should:  Maintain a healthy weight.  Eat a diet high in fiber (fruits, vegetables and whole grains).  Drink plenty of fluids to avoid constipation. This means drinking enough water and other liquids to keep your urine clear or pale yellow.  Do not lift heavy objects.  Do not stand for long periods of time.  Quit smoking. This should keep you from developing a frequent cough. SEEK MEDICAL CARE IF:   A bulge develops in your groin area.  You feel pain, a burning sensation or pressure in the groin. This might be worse if you are lifting or straining.  You develop a fever of more than 100.5 F (38.1 C). SEEK IMMEDIATE MEDICAL CARE IF:   Pain in the groin increases suddenly.  A bulge in the groin gets bigger suddenly and does not go down.  For men, there is sudden pain in the scrotum. Or, the size of the scrotum increases.  A bulge in the groin area becomes red or purple and is painful to touch.  You have nausea or vomiting that does not go away.  You feel your heart beating much faster than normal.  You cannot have a bowel movement or pass gas.  You develop a fever of more than 102.0 F (38.9 C). Document Released: 05/14/2008 Document Revised: 03/20/2011 Document Reviewed: 05/14/2008 [facility] Patient Information 2015 [place], [place]. This information is not intended to replace advice given to you by your health care provider. Make sure you discuss any questions you have with your health care provider.

## 2013-07-28 ENCOUNTER — Ambulatory Visit (INDEPENDENT_AMBULATORY_CARE_PROVIDER_SITE_OTHER): Payer: Medicare Other | Admitting: General Surgery

## 2013-07-30 ENCOUNTER — Encounter (HOSPITAL_BASED_OUTPATIENT_CLINIC_OR_DEPARTMENT_OTHER): Payer: Self-pay | Admitting: *Deleted

## 2013-07-30 NOTE — Progress Notes (Signed)
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

## 2013-07-31 ENCOUNTER — Encounter (HOSPITAL_BASED_OUTPATIENT_CLINIC_OR_DEPARTMENT_OTHER)
Admission: RE | Admit: 2013-07-31 | Discharge: 2013-07-31 | Disposition: A | Discharge status: Home / Self Care | Payer: Medicare Other | Source: Ambulatory Visit | Attending: General Surgery | Admitting: General Surgery

## 2013-07-31 DIAGNOSIS — K4090 Unilateral inguinal hernia, without obstruction or gangrene, not specified as recurrent: Secondary | ICD-10-CM | POA: Diagnosis not present

## 2013-07-31 DIAGNOSIS — Z79899 Other long term (current) drug therapy: Secondary | ICD-10-CM | POA: Diagnosis not present

## 2013-07-31 DIAGNOSIS — Z9889 Other specified postprocedural states: Secondary | ICD-10-CM | POA: Diagnosis not present

## 2013-07-31 DIAGNOSIS — M6200 Separation of muscle (nontraumatic), unspecified site: Secondary | ICD-10-CM | POA: Diagnosis not present

## 2013-07-31 DIAGNOSIS — K219 Gastro-esophageal reflux disease without esophagitis: Secondary | ICD-10-CM | POA: Diagnosis not present

## 2013-07-31 DIAGNOSIS — Z87891 Personal history of nicotine dependence: Secondary | ICD-10-CM | POA: Diagnosis not present

## 2013-07-31 LAB — URINALYSIS, ROUTINE W REFLEX MICROSCOPIC
Bilirubin Urine: NEGATIVE
GLUCOSE, UA: NEGATIVE mg/dL
Hgb urine dipstick: NEGATIVE
Ketones, ur: NEGATIVE mg/dL
Leukocytes, UA: NEGATIVE
Nitrite: NEGATIVE
PH: 6.5 (ref 5.0–8.0)
PROTEIN: NEGATIVE mg/dL
Specific Gravity, Urine: 1.009 (ref 1.005–1.030)
Urobilinogen, UA: 1 mg/dL (ref 0.0–1.0)

## 2013-07-31 LAB — CBC WITH DIFFERENTIAL/PLATELET
BASOS ABS: 0 10*3/uL (ref 0.0–0.1)
Basophils Relative: 0 % (ref 0–1)
EOS PCT: 3 % (ref 0–5)
Eosinophils Absolute: 0.2 10*3/uL (ref 0.0–0.7)
HCT: 45.4 % (ref 39.0–52.0)
Hemoglobin: 15.9 g/dL (ref 13.0–17.0)
Lymphocytes Relative: 26 % (ref 12–46)
Lymphs Abs: 1.8 10*3/uL (ref 0.7–4.0)
MCH: 33.8 pg (ref 26.0–34.0)
MCHC: 35 g/dL (ref 30.0–36.0)
MCV: 96.4 fL (ref 78.0–100.0)
Monocytes Absolute: 0.7 10*3/uL (ref 0.1–1.0)
Monocytes Relative: 11 % (ref 3–12)
NEUTROS ABS: 4.1 10*3/uL (ref 1.7–7.7)
Neutrophils Relative %: 60 % (ref 43–77)
PLATELETS: 151 10*3/uL (ref 150–400)
RBC: 4.71 MIL/uL (ref 4.22–5.81)
RDW: 12.2 % (ref 11.5–15.5)
WBC: 6.9 10*3/uL (ref 4.0–10.5)

## 2013-07-31 LAB — COMPREHENSIVE METABOLIC PANEL
ALT: 50 U/L (ref 0–53)
AST: 25 U/L (ref 0–37)
Albumin: 3.8 g/dL (ref 3.5–5.2)
Alkaline Phosphatase: 77 U/L (ref 39–117)
Anion gap: 12 (ref 5–15)
BUN: 13 mg/dL (ref 6–23)
CALCIUM: 9.5 mg/dL (ref 8.4–10.5)
CHLORIDE: 97 meq/L (ref 96–112)
CO2: 26 mEq/L (ref 19–32)
Creatinine, Ser: 0.9 mg/dL (ref 0.50–1.35)
GFR calc Af Amer: >90 mL/min (ref >90)
GFR calc non Af Amer: 84 mL/min — ABNORMAL LOW (ref >90)
Glucose, Bld: 103 mg/dL — ABNORMAL HIGH (ref 70–99)
Potassium: 4.7 mEq/L (ref 3.7–5.3)
SODIUM: 135 meq/L — AB (ref 137–147)
Total Bilirubin: 0.7 mg/dL (ref 0.3–1.2)
Total Protein: 6.8 g/dL (ref 6.0–8.3)

## 2013-07-31 NOTE — H&P (Signed)
Tommy Hobbs   MRN:  [id number]   Description: 73 year old male  Provider: Adin Hector, MD  Department: [facility]         Diagnoses      Left inguinal hernia    -  Primary      550.90             Current Vitals Most recent update: 07/17/2013 11:31 AM by [place], CMA      BP Pulse Temp(Src) Ht Wt BMI      132/78 62 97.5 F (36.4 C) 6' (1.829 m) 195 lb (88.451 kg) 26.44 kg/m2           History and Physical    Adin Hector, MD    Status: Signed            Patient ID: Tommy Hobbs, male   DOB: 09/22/1940, 73 y.o.   MRN: [id number]              HPI Tommy Hobbs is a 73 y.o. male.  Referred by Dr. Jenny Hobbs free for left inguinal hernia. Tommy Hobbs is his PCP.   The patient states that he has had left groin pain for 15 years. Doesn't feel a bulge. He says this feels just like the right inguinal hernia that he had repaired in 1977.   A CT scan shows small left inguinal hernia containing fat. Aortic calcifications. Evidence of previous TURP.   The patient has a history of retrograde TURP through the bladder in the year 2012. [name] He has been better since then he's also had an open cholecystectomy. He has left shoulder problems. He has diastases recti. HPI   History reviewed. No pertinent past medical history.   History reviewed. No pertinent past surgical history.   History reviewed. No pertinent family history.   Social History History   Substance Use Topics   .  Smoking status:  Never Smoker    .  Smokeless tobacco:  Not on file   .  Alcohol Use:  No         Allergies   Allergen  Reactions   .  Cocamidopropyl Betaine  Hives and Rash   .  Esomeprazole Magnesium  Hives and Rash   .  Tegopen [Cloxacillin Sodium]  Rash         Current Outpatient  Prescriptions   Medication  Sig  Dispense  Refill   .  Multiple Vitamin (MULITIVITAMIN WITH MINERALS) TABS  Take 1 tablet by mouth daily.         Marland Kitchen  omega-3 acid ethyl esters (LOVAZA) 1 G capsule  Take 2 g by mouth 2 (two) times daily.         Marland Kitchen  omeprazole (PRILOSEC) 20 MG capsule  Take 20 mg by mouth daily.              Review of Systems  Constitutional: Negative for fever, chills and unexpected weight change.  HENT: Negative for congestion, hearing loss, sore throat, trouble swallowing and voice change.   Eyes: Negative for visual disturbance.  Respiratory: Negative for cough and wheezing.   Cardiovascular: Negative for chest pain, palpitations and leg swelling.  Gastrointestinal: Negative for nausea, vomiting, abdominal pain, diarrhea, constipation, blood in stool, abdominal distention,  anal bleeding and rectal pain.  Genitourinary: Positive for frequency. Negative for hematuria and difficulty urinating.  Musculoskeletal: Positive for arthralgias, myalgias and neck stiffness.  Skin: Negative for rash and wound.  Neurological: Negative for seizures, syncope, weakness and headaches.  Hematological: Negative for adenopathy. Does not bruise/bleed easily.  Psychiatric/Behavioral: Negative for confusion.      Blood pressure 132/78, pulse 62, temperature 97.5 F (36.4 C), height 6' (1.829 m), weight 195 lb (88.451 kg).   Physical Exam  Constitutional: He is oriented to person, place, and time. He appears well-developed and well-nourished. No distress.  Very pleasant. friendly  HENT:   Head: Normocephalic.   Nose: Nose normal.   Mouth/Throat: No oropharyngeal exudate.  Eyes: Conjunctivae and EOM are normal. Pupils are equal, round, and reactive to light. Right eye exhibits no discharge. Left eye exhibits no discharge. No scleral icterus.  Neck: Normal range of motion. Neck supple. No JVD present. No tracheal deviation present. No thyromegaly present.  Left neck incision from  fusion surgery.  Cardiovascular: Normal rate, regular rhythm, normal heart sounds and intact distal pulses.    No murmur heard. Pulmonary/Chest: Effort normal and breath sounds normal. No stridor. No respiratory distress. He has no wheezes. He has no rales. He exhibits no tenderness.  Abdominal: Soft. Bowel sounds are normal. He exhibits no distension and no mass. There is no tenderness. There is no rebound and no guarding.  Diastases recti. Pinpoint umbilical hernia, nontender. Right subcostal incision healed. Lower midline incision healed. Cannot see a right groin incision.  Genitourinary:  With standing I can feel a small left inguinal hernia and he is reproducibly tender at that point. He says this is the same pain he has been having. No evidence of hernia on the right. No scrotal mass. Testes smooth but a little small.  Musculoskeletal: Normal range of motion. He exhibits no edema and no tenderness.  Lymphadenopathy:    He has no cervical adenopathy.  Neurological: He is alert and oriented to person, place, and time. He has normal reflexes. Coordination normal.  Skin: Skin is warm and dry. No rash noted. He is not diaphoretic. No erythema. No pallor.  Psychiatric: He has a normal mood and affect. His behavior is normal. Judgment and thought content normal.      Data Reviewed CT scan.   Assessment    Painful left inguinal hernia. Small. Contains fat only. Symptoms and physical exam correlates with CT scan findings   Remote history right inguinal hernia repair, and a recurrence   Status post open cholecystectomy   Status post open, retrograde prostate operation for BPH   GERD      Plan    At the patient's request, he will be scheduled for open repair of left inguinal hernia with mesh.   I discussed the indications, details, techniques, numerous risks of the surgery with him. He is aware of the risk of bleeding, infection, recurrence, injury to adjacent organs, nerve  damage with chronic pain, and other understanding problems. He he understands all these issues and all his questions are ansswered.  He agrees with this plan.         Tommy Hobbs. Tommy Hobbs, M.D., [facility] General and Minimally invasive Surgery Breast and Colorectal Surgery Office:   [phone] Pager:   [phone]

## 2013-08-04 ENCOUNTER — Encounter (HOSPITAL_BASED_OUTPATIENT_CLINIC_OR_DEPARTMENT_OTHER): Payer: Self-pay | Admitting: *Deleted

## 2013-08-04 ENCOUNTER — Encounter (HOSPITAL_BASED_OUTPATIENT_CLINIC_OR_DEPARTMENT_OTHER): Payer: Medicare Other | Admitting: Anesthesiology

## 2013-08-04 ENCOUNTER — Ambulatory Visit (HOSPITAL_BASED_OUTPATIENT_CLINIC_OR_DEPARTMENT_OTHER)
Admission: RE | Admit: 2013-08-04 | Discharge: 2013-08-04 | Disposition: A | Discharge status: Home / Self Care | Payer: Medicare Other | Source: Ambulatory Visit | Attending: General Surgery | Admitting: General Surgery

## 2013-08-04 ENCOUNTER — Encounter (HOSPITAL_BASED_OUTPATIENT_CLINIC_OR_DEPARTMENT_OTHER)
Admission: RE | Disposition: A | Discharge status: Home / Self Care | Payer: Self-pay | Source: Ambulatory Visit | Attending: General Surgery

## 2013-08-04 ENCOUNTER — Ambulatory Visit (HOSPITAL_BASED_OUTPATIENT_CLINIC_OR_DEPARTMENT_OTHER): Payer: Medicare Other | Admitting: Anesthesiology

## 2013-08-04 ENCOUNTER — Telehealth (INDEPENDENT_AMBULATORY_CARE_PROVIDER_SITE_OTHER): Payer: Self-pay | Admitting: *Deleted

## 2013-08-04 DIAGNOSIS — Z9889 Other specified postprocedural states: Secondary | ICD-10-CM | POA: Insufficient documentation

## 2013-08-04 DIAGNOSIS — M6200 Separation of muscle (nontraumatic), unspecified site: Secondary | ICD-10-CM | POA: Diagnosis not present

## 2013-08-04 DIAGNOSIS — Z79899 Other long term (current) drug therapy: Secondary | ICD-10-CM | POA: Insufficient documentation

## 2013-08-04 DIAGNOSIS — K4090 Unilateral inguinal hernia, without obstruction or gangrene, not specified as recurrent: Secondary | ICD-10-CM | POA: Diagnosis present

## 2013-08-04 DIAGNOSIS — Z87891 Personal history of nicotine dependence: Secondary | ICD-10-CM | POA: Diagnosis not present

## 2013-08-04 DIAGNOSIS — K219 Gastro-esophageal reflux disease without esophagitis: Secondary | ICD-10-CM | POA: Insufficient documentation

## 2013-08-04 HISTORY — DX: Reserved for concepts with insufficient information to code with codable children: IMO0002

## 2013-08-04 HISTORY — PX: INGUINAL HERNIA REPAIR: SHX194

## 2013-08-04 HISTORY — DX: Personal history of other diseases of male genital organs: Z87.438

## 2013-08-04 HISTORY — DX: Gastro-esophageal reflux disease without esophagitis: K21.9

## 2013-08-04 HISTORY — DX: Presence of dental prosthetic device (complete) (partial): Z97.2

## 2013-08-04 HISTORY — DX: Unspecified osteoarthritis, unspecified site: M19.90

## 2013-08-04 HISTORY — PX: INSERTION OF MESH: SHX5868

## 2013-08-04 HISTORY — DX: Cardiac arrhythmia, unspecified: I49.9

## 2013-08-04 SURGERY — REPAIR, HERNIA, INGUINAL, ADULT
Anesthesia: Regional | Site: Groin

## 2013-08-04 MED ORDER — OXYCODONE-ACETAMINOPHEN 10-325 MG PO TABS: 1.0000 | ORAL_TABLET | ORAL | Status: DC | PRN

## 2013-08-04 MED ORDER — PROPOFOL 10 MG/ML IV BOLUS
INTRAVENOUS | Status: DC | PRN
  Administered 2013-08-04: 150 mg via INTRAVENOUS

## 2013-08-04 MED ORDER — HYDROMORPHONE HCL PF 1 MG/ML IJ SOLN
INTRAMUSCULAR | Status: AC
  Filled 2013-08-04: qty 1

## 2013-08-04 MED ORDER — CHLORHEXIDINE GLUCONATE 4 % EX LIQD: 1.0000 "application " | Freq: Once | CUTANEOUS | Status: DC

## 2013-08-04 MED ORDER — CEFAZOLIN SODIUM-DEXTROSE 2-3 GM-% IV SOLR
INTRAVENOUS | Status: AC
  Filled 2013-08-04: qty 50

## 2013-08-04 MED ORDER — EPHEDRINE SULFATE 50 MG/ML IJ SOLN
INTRAMUSCULAR | Status: DC | PRN
  Administered 2013-08-04: 15 mg via INTRAVENOUS
  Administered 2013-08-04: 10 mg via INTRAVENOUS

## 2013-08-04 MED ORDER — HYDROMORPHONE HCL PF 1 MG/ML IJ SOLN
0.2500 mg | INTRAMUSCULAR | Status: DC | PRN
  Administered 2013-08-04 (×4): 0.5 mg via INTRAVENOUS

## 2013-08-04 MED ORDER — LIDOCAINE HCL (CARDIAC) 20 MG/ML IV SOLN
INTRAVENOUS | Status: DC | PRN
  Administered 2013-08-04: 75 mg via INTRAVENOUS

## 2013-08-04 MED ORDER — FENTANYL CITRATE 0.05 MG/ML IJ SOLN
INTRAMUSCULAR | Status: AC
  Filled 2013-08-04: qty 2

## 2013-08-04 MED ORDER — MIDAZOLAM HCL 2 MG/2ML IJ SOLN
INTRAMUSCULAR | Status: AC
  Filled 2013-08-04: qty 2

## 2013-08-04 MED ORDER — BUPIVACAINE-EPINEPHRINE (PF) 0.5% -1:200000 IJ SOLN
INTRAMUSCULAR | Status: AC
  Filled 2013-08-04: qty 30

## 2013-08-04 MED ORDER — MIDAZOLAM HCL 2 MG/2ML IJ SOLN
1.0000 mg | INTRAMUSCULAR | Status: DC | PRN
Start: 2013-08-04 — End: 2013-08-04
  Administered 2013-08-04: 1 mg via INTRAVENOUS

## 2013-08-04 MED ORDER — BUPIVACAINE-EPINEPHRINE 0.5% -1:200000 IJ SOLN
INTRAMUSCULAR | Status: DC | PRN
  Administered 2013-08-04: 10 mL

## 2013-08-04 MED ORDER — OXYCODONE HCL 5 MG PO TABS
ORAL_TABLET | ORAL | Status: AC
  Filled 2013-08-04: qty 1

## 2013-08-04 MED ORDER — SODIUM BICARBONATE 4 % IV SOLN
INTRAVENOUS | Status: AC
  Filled 2013-08-04: qty 5

## 2013-08-04 MED ORDER — OXYCODONE HCL 5 MG PO TABS
5.0000 mg | ORAL_TABLET | Freq: Once | ORAL | Status: AC
Start: 2013-08-04 — End: 2013-08-04
  Administered 2013-08-04: 5 mg via ORAL

## 2013-08-04 MED ORDER — FENTANYL CITRATE 0.05 MG/ML IJ SOLN
50.0000 ug | INTRAMUSCULAR | Status: DC | PRN
  Administered 2013-08-04: 100 ug via INTRAVENOUS

## 2013-08-04 MED ORDER — FENTANYL CITRATE 0.05 MG/ML IJ SOLN
INTRAMUSCULAR | Status: AC
  Filled 2013-08-04: qty 6

## 2013-08-04 MED ORDER — LACTATED RINGERS IV SOLN
INTRAVENOUS | Status: DC
  Administered 2013-08-04: 10:00:00 via INTRAVENOUS

## 2013-08-04 MED ORDER — DEXAMETHASONE SODIUM PHOSPHATE 4 MG/ML IJ SOLN
INTRAMUSCULAR | Status: DC | PRN
  Administered 2013-08-04: 10 mg via INTRAVENOUS

## 2013-08-04 MED ORDER — ROPIVACAINE HCL 5 MG/ML IJ SOLN
INTRAMUSCULAR | Status: DC | PRN
  Administered 2013-08-04: 30 mL

## 2013-08-04 MED ORDER — CEFAZOLIN SODIUM-DEXTROSE 2-3 GM-% IV SOLR
2.0000 g | INTRAVENOUS | Status: AC
  Administered 2013-08-04: 2 g via INTRAVENOUS

## 2013-08-04 SURGICAL SUPPLY — 58 items
BENZOIN TINCTURE PRP APPL 2/3 (GAUZE/BANDAGES/DRESSINGS) IMPLANT
BLADE HEX COATED 2.75 (ELECTRODE) ×4 IMPLANT
BLADE SURG 10 STRL SS (BLADE) ×4 IMPLANT
BLADE SURG ROTATE 9660 (MISCELLANEOUS) ×4 IMPLANT
CANISTER SUCT 1200ML W/VALVE (MISCELLANEOUS) ×4 IMPLANT
CHLORAPREP W/TINT 26ML (MISCELLANEOUS) ×4 IMPLANT
CLOSURE WOUND 1/2 X4 (GAUZE/BANDAGES/DRESSINGS)
COVER MAYO STAND STRL (DRAPES) ×4 IMPLANT
COVER TABLE BACK 60X90 (DRAPES) ×4 IMPLANT
DECANTER SPIKE VIAL GLASS SM (MISCELLANEOUS) ×4 IMPLANT
DERMABOND ADVANCED (GAUZE/BANDAGES/DRESSINGS) ×2
DERMABOND ADVANCED .7 DNX12 (GAUZE/BANDAGES/DRESSINGS) ×2 IMPLANT
DRAIN PENROSE 1/2X12 LTX STRL (WOUND CARE) ×4 IMPLANT
DRAPE LAPAROTOMY TRNSV 102X78 (DRAPE) ×4 IMPLANT
DRAPE PED LAPAROTOMY (DRAPES) ×4 IMPLANT
DRAPE UTILITY XL STRL (DRAPES) ×4 IMPLANT
ELECT REM PT RETURN 9FT ADLT (ELECTROSURGICAL) ×4
ELECTRODE REM PT RTRN 9FT ADLT (ELECTROSURGICAL) ×2 IMPLANT
GLOVE EUDERMIC 7 POWDERFREE (GLOVE) ×4 IMPLANT
GOWN STRL REUS W/ TWL LRG LVL3 (GOWN DISPOSABLE) ×2 IMPLANT
GOWN STRL REUS W/ TWL XL LVL3 (GOWN DISPOSABLE) ×2 IMPLANT
GOWN STRL REUS W/TWL LRG LVL3 (GOWN DISPOSABLE) ×2
GOWN STRL REUS W/TWL XL LVL3 (GOWN DISPOSABLE) ×2
MESH ULTRAPRO 3X6 7.6X15CM (Mesh General) ×4 IMPLANT
NEEDLE HYPO 22GX1.5 SAFETY (NEEDLE) IMPLANT
NEEDLE HYPO 25X1 1.5 SAFETY (NEEDLE) ×4 IMPLANT
NS IRRIG 1000ML POUR BTL (IV SOLUTION) ×4 IMPLANT
PACK BASIN DAY SURGERY FS (CUSTOM PROCEDURE TRAY) ×4 IMPLANT
PENCIL BUTTON HOLSTER BLD 10FT (ELECTRODE) ×4 IMPLANT
SLEEVE SCD COMPRESS KNEE MED (MISCELLANEOUS) ×4 IMPLANT
SPONGE GAUZE 4X4 12PLY STER LF (GAUZE/BANDAGES/DRESSINGS) IMPLANT
SPONGE LAP 4X18 X RAY DECT (DISPOSABLE) ×4 IMPLANT
STAPLER VISISTAT 35W (STAPLE) IMPLANT
STRIP CLOSURE SKIN 1/2X4 (GAUZE/BANDAGES/DRESSINGS) IMPLANT
SUT MNCRL AB 4-0 PS2 18 (SUTURE) ×4 IMPLANT
SUT PROLENE 1 CT (SUTURE) IMPLANT
SUT PROLENE 2 0 CT2 30 (SUTURE) ×16 IMPLANT
SUT SILK 2 0 SH (SUTURE) IMPLANT
SUT SILK 2 0 TIES 17X18 (SUTURE) ×2
SUT SILK 2-0 18XBRD TIE BLK (SUTURE) ×2 IMPLANT
SUT SILK 3 0 SH 30 (SUTURE) IMPLANT
SUT VIC AB 2-0 CT1 27 (SUTURE)
SUT VIC AB 2-0 CT1 TAPERPNT 27 (SUTURE) IMPLANT
SUT VIC AB 2-0 SH 27 (SUTURE) ×2
SUT VIC AB 2-0 SH 27XBRD (SUTURE) ×2 IMPLANT
SUT VIC AB 3-0 54X BRD REEL (SUTURE) ×2 IMPLANT
SUT VIC AB 3-0 BRD 54 (SUTURE) ×2
SUT VIC AB 3-0 FS2 27 (SUTURE) IMPLANT
SUT VIC AB 3-0 SH 27 (SUTURE) ×2
SUT VIC AB 3-0 SH 27X BRD (SUTURE) ×2 IMPLANT
SUT VICRYL 3-0 CR8 SH (SUTURE) IMPLANT
SYRINGE 12CC LL (MISCELLANEOUS) ×4 IMPLANT
SYRINGE 20CC LL (MISCELLANEOUS) IMPLANT
TOWEL OR NON WOVEN STRL DISP B (DISPOSABLE) ×4 IMPLANT
TRAY DSU PREP LF (CUSTOM PROCEDURE TRAY) ×4 IMPLANT
TUBE CONNECTING 20'X1/4 (TUBING) ×1
TUBE CONNECTING 20X1/4 (TUBING) ×3 IMPLANT
YANKAUER SUCT BULB TIP NO VENT (SUCTIONS) ×4 IMPLANT

## 2013-08-04 NOTE — Anesthesia Procedure Notes (Addendum)
Anesthesia Regional Block:  TAP block  Pre-Anesthetic Checklist: ,, timeout performed, Correct Patient, Correct Site, Correct Laterality, Correct Procedure, Correct Position, site marked, Risks and benefits discussed, pre-op evaluation, post-op pain management  Laterality: Left  Prep: Maximum Sterile Barrier Precautions used and chloraprep       Needles:  Injection technique: Single-shot  Needle Type: Echogenic Stimulator Needle     Needle Length: 9cm 9 cm Needle Gauge: 21 and 21 G    Additional Needles:  Procedures: ultrasound guided (picture in chart) TAP block Narrative:  Start time: 08/04/2013 10:00 AM End time: 08/04/2013 10:14 AM Injection made incrementally with aspirations every 5 mL. Anesthesiologist: Fitzgerald,MD  Additional Notes: 2% Lidocaine skin wheel.   Procedure Name: LMA Insertion Date/Time: 08/04/2013 10:37 AM Performed by: Melynda Ripple D Pre-anesthesia Checklist: Patient identified, Emergency Drugs available, Suction available and Patient being monitored Patient Re-evaluated:Patient Re-evaluated prior to inductionOxygen Delivery Method: Circle System Utilized Preoxygenation: Pre-oxygenation with 100% oxygen Intubation Type: IV induction Ventilation: Mask ventilation without difficulty LMA: LMA inserted LMA Size: 5.0 Number of attempts: 1 Airway Equipment and Method: bite block Placement Confirmation: positive ETCO2 Tube secured with: Tape Dental Injury: Teeth and Oropharynx as per pre-operative assessment

## 2013-08-04 NOTE — Interval H&P Note (Signed)
History and Physical Interval Note:  08/04/2013 10:09 AM  Tommy Hobbs  has presented today for surgery, with the diagnosis of left inguinal hernia  The goals and the various methods of treatment have been discussed with the patient and family. After consideration of risks, benefits and other options for treatment, the patient has consented to  Procedure(s): OPEN LEFT INGUINAL HERNIA REPAIR (Left) INSERTION OF MESH (N/A) as a surgical intervention .  The patient's history has been reviewed, patient examined today, no change in status, stable for surgery.  I have reviewed the patient's chart and labs.  Questions were answered to the patient's satisfaction.     Adin Hector

## 2013-08-04 NOTE — Transfer of Care (Signed)
Immediate Anesthesia Transfer of Care Note  Patient: Tommy Hobbs  Procedure(s) Performed: Procedure(s): OPEN LEFT INGUINAL HERNIA REPAIR (Left) INSERTION OF MESH (N/A)  Patient Location: PACU  Anesthesia Type:General and Regional  Level of Consciousness: awake, alert  and oriented  Airway & Oxygen Therapy: Patient Spontanous Breathing and Patient connected to face mask oxygen  Post-op Assessment: Report given to PACU RN and Post -op Vital signs reviewed and stable  Post vital signs: Reviewed and stable  Complications: No apparent anesthesia complications

## 2013-08-04 NOTE — Telephone Encounter (Signed)
Pt's wife called wanting to know when her husband could drive.  He just had surgery this morning, hernia.  I advised her that as long as he is taking narcotic pain medication, that he could not drive.  She advised that she knew that, but he wanted her to call and ask.    Anderson Malta

## 2013-08-04 NOTE — Anesthesia Preprocedure Evaluation (Addendum)
Anesthesia Evaluation  Patient identified by MRN, date of birth, ID band Patient awake    Reviewed: Allergy & Precautions, H&P , NPO status , Patient's Chart, lab work & pertinent test results  Airway Mallampati: II TM Distance: >3 FB Neck ROM: Full    Dental no notable dental hx. (+) Upper Dentures, Dental Advisory Given   Pulmonary neg pulmonary ROS, former smoker,  breath sounds clear to auscultation  Pulmonary exam normal       Cardiovascular negative cardio ROS  Rhythm:Regular Rate:Normal     Neuro/Psych negative neurological ROS  negative psych ROS   GI/Hepatic Neg liver ROS, GERD-  Medicated,  Endo/Other  negative endocrine ROS  Renal/GU negative Renal ROS  negative genitourinary   Musculoskeletal   Abdominal   Peds  Hematology negative hematology ROS (+)   Anesthesia Other Findings   Reproductive/Obstetrics negative OB ROS                          Anesthesia Physical Anesthesia Plan  ASA: II  Anesthesia Plan: General and Regional   Post-op Pain Management:    Induction: Intravenous  Airway Management Planned: LMA  Additional Equipment:   Intra-op Plan:   Post-operative Plan: Extubation in OR  Informed Consent: I have reviewed the patients History and Physical, chart, labs and discussed the procedure including the risks, benefits and alternatives for the proposed anesthesia with the patient or authorized representative who has indicated his/her understanding and acceptance.   Dental advisory given  Plan Discussed with: CRNA  Anesthesia Plan Comments:         Anesthesia Quick Evaluation

## 2013-08-04 NOTE — Op Note (Signed)
Patient Name:           Tommy Hobbs   Date of Surgery:        08/04/2013  Note: This dictation was prepared with Dragon/digital dictation along with [facility] technology. Any transcriptional errors that result from this process are unintentional.    Pre op Diagnosis:      Left inguinal hernia  Post op Diagnosis:    Direct left inguinal hernia  Procedure:                 Open repair left inguinal hernia with mesh Karl Pock repair)  Surgeon:                     Edsel Petrin. Dalbert Batman, M.D., FACS  Assistant:                      None  Operative Indications:   Tommy Hobbs is a 73 y.o. Male.  [name] is his PCP.  The patient states that he has had left groin pain for 15 years. Doesn't feel a bulge. He says this feels just like the right inguinal hernia that he had repaired in 1977.  A CT scan shows small left inguinal hernia containing fat. Aortic calcifications. Evidence of previous TURP.  The patient has a history of retrograde TURP through the bladder in the year 2012. Tommy Hobbs. He did not have cancer but they could not do it retrograde transurethrally and had to make an incision in the lower abdomen and into the bladder to open things up. He has been better since then he's also had an open cholecystectomy. He has left shoulder problems. He has diastases recti. On exam, with the patient standing I can feel a small left inguinal hernia which is reproducibly tender. No hernia on the right side. No scrotal mass. Testes feel normal but small and somewhat atrophic.    Operative Findings:       Medium-sized direct left inguinal hernia. He had a small lipoma of the cord which was resected. I made a careful search for an indirect sac and found no indirect sac circumferentially. Cord structures were actually small and easily visualized.  Procedure in Detail:          The patient had a TAPP Block by Dr. Ola Spurr. That apparently went well. He was taken the operating room and  underwent general anesthesia with LMA device. The lower abdomen &  left groin were prepped and draped in a sterile fashion. Intravenous antibiotics were given. Surgical time out was performed. 0.5% Marcaine with epinephrine was used as local infiltration anesthetic and the skin and superficial subcutaneous tissue.      A transverse incision was made in the left groin overlying the left inguinal canal. Dissection was carried down through the subcutaneous tissue, exposing the aponeurosis of the external oblique. The external oblique was to incised in the direction of its fibers, opening up the external inguinal ring. The cord structures were mobilized and encircled with a Penrose drain. The ilioinguinal nerve was intimately associated with the cord. It was isolated, traced back laterally to its emergence from the muscles laterally, clamped divided and ligated with 2 silk ties. The redundant nerve medially was resected.      I skeletonized the cremasteric fibers. Isolated the lipoma and separated that from the cord structures  back to the internal ring. This was clamped, divided, and ligated with 3-0 Vicryl tie. I could see the direct hernia.  I carefully inspected the cord and there was no evidence of indirect hernia. The floor of the inguinal canal was repaired and reinforced with an onlay graft of ultra Pro mesh. A 3" x 6" piece of UltraPro  mesh was brought to the field and trimmed at the corners to accommodate the anatomy of the wound. The mesh was sutured in place with running sutures of 2-0 Prolene in interrupted mattress sutures of 2-0 Prolene. The mesh was sutured so as to generously overlap the fascia at the pubic tubercle, then up along the inguinal ligament inferiorly. Interrupted mattress sutures were placed medially, superiorly, and superior laterally to secure the mesh. The mesh was incised laterally so as to wraparound the cord structures at the internal ring. The tails of the mesh were overlapped   laterally and a few sutures were placed laterally. This provided a very secure repair and coverage both medial and lateral to the internal ring but allowed an adequate opening for the cord structures.      The wound was irrigated. Hemostasis was excellent. The external oblique was closed with a running suture of 2-0 Vicryl, placing the  the cord structures deep to the external oblique. Scarpa's fascia was closed with 3-0 Vicryl sutures and the skin closed with a running 4-0 Monocryl suture. Dermabond was placed. The patient was taken recovery room in stable condition. EBL 10 cc. Counts correct. Complications none.         Edsel Petrin. Dalbert Batman, M.D., FACS General and Minimally Invasive Surgery Breast and Colorectal Surgery  08/04/2013 11:49 AM

## 2013-08-04 NOTE — Discharge Instructions (Signed)
No lifting more than 15 pounds Be sure to drink lots of fluids  Make sure that you are able to void once or twice tonight  Walk as much as possible  You may shower, starting tomorrow.     [facility] _______[facility]  UMBILICAL OR INGUINAL HERNIA REPAIR: POST OP INSTRUCTIONS  Always review your discharge instruction sheet given to you by the facility where your surgery was performed. IF YOU HAVE DISABILITY OR FAMILY LEAVE FORMS, YOU MUST BRING THEM TO THE OFFICE FOR PROCESSING.   DO NOT GIVE THEM TO YOUR DOCTOR.  1. A  prescription for pain medication may be given to you upon discharge.  Take your pain medication as prescribed, if needed.  If narcotic pain medicine is not needed, then you may take acetaminophen (Tylenol) or ibuprofen (Advil) as needed. 2. Take your usually prescribed medications unless otherwise directed. 3. If you need a refill on your pain medication, please contact your pharmacy.  They will contact our office to request authorization. Prescriptions will not be filled after 5 pm or on week-ends. 4. You should follow a light diet the first 24 hours after arrival home, such as soup and crackers, etc.  Be sure to include lots of fluids daily.  Resume your normal diet the day after surgery. 5. Most patients will experience some swelling and bruising around the umbilicus or in the groin and scrotum.  Ice packs and reclining will help.  Swelling and bruising can take several days to resolve.  6. It is common to experience some constipation if taking pain medication after surgery.  Increasing fluid intake and taking a stool softener (such as Colace) will usually help or prevent this problem from occurring.  A mild laxative (Milk of Magnesia or Miralax) should be taken according to package directions if there are no bowel movements after 48 hours. 7. Unless discharge instructions indicate otherwise, you may remove your bandages 24-48 hours after surgery, and you may  shower at that time.  You may have steri-strips (small skin tapes) in place directly over the incision.  These strips should be left on the skin for 7-10 days.  If your surgeon used skin glue on the incision, you may shower in 24 hours.  The glue will flake off over the next 2-3 weeks.  Any sutures or staples will be removed at the office during your follow-up visit. 8. ACTIVITIES:  You may resume regular (light) daily activities beginning the next day--such as daily self-care, walking, climbing stairs--gradually increasing activities as tolerated.  You may have sexual intercourse when it is comfortable.  Refrain from any heavy lifting or straining until approved by your doctor. a. You may drive when you are no longer taking prescription pain medication, you can comfortably wear a seatbelt, and you can safely maneuver your car and apply brakes. b. RETURN TO WORK:  __________________________________________________________ 9. You should see your doctor in the office for a follow-up appointment approximately 2-3 weeks after your surgery.  Make sure that you call for this appointment within a day or two after you arrive home to insure a convenient appointment time. 10. OTHER INSTRUCTIONS:  __________________________________________________________________________________________________________________________________________________________________________________________  WHEN TO CALL YOUR DOCTOR: 1. Fever over 101.0 2. Inability to urinate 3. Nausea and/or vomiting 4. Extreme swelling or bruising 5. Continued bleeding from incision. 6. Increased pain, redness, or drainage from the incision  The clinic staff is available to answer your questions during regular business hours.  Please dont hesitate to call and ask  to speak to one of the nurses for clinical concerns.  If you have a medical emergency, go to the nearest emergency room or call 911.  A surgeon from [facility] is always on call  at the hospital   [address] [place], [place], [place]  [address] ?  [address], [place], [place]   [address] [phone] ? [phone] ? FAX [phone] Web site: [email or web address]   Post Anesthesia Home Care Instructions  Activity: Get plenty of rest for the remainder of the day. A responsible adult should stay with you for 24 hours following the procedure.  For the next 24 hours, DO NOT: -Drive a car -Paediatric nurse -Drink alcoholic beverages -Take any medication unless instructed by your physician -Make any legal decisions or sign important papers.  Meals: Start with liquid foods such as gelatin or soup. Progress to regular foods as tolerated. Avoid greasy, spicy, heavy foods. If nausea and/or vomiting occur, drink only clear liquids until the nausea and/or vomiting subsides. Call your physician if vomiting continues.  Special Instructions/Symptoms: Your throat may feel dry or sore from the anesthesia or the breathing tube placed in your throat during surgery. If this causes discomfort, gargle with warm salt water. The discomfort should disappear within 24 hours.

## 2013-08-04 NOTE — Progress Notes (Signed)
[facility] opt Assisted Dr. Ola Spurr with left, ultrasound guided, transabdominal plane block. Side rails up, monitors on throughout procedure. See vital signs in flow sheet. Tolerated Procedure well.

## 2013-08-04 NOTE — Anesthesia Postprocedure Evaluation (Signed)
  Anesthesia Post-op Note  Patient: Tommy Hobbs  Procedure(s) Performed: Procedure(s): OPEN LEFT INGUINAL HERNIA REPAIR (Left) INSERTION OF MESH (N/A)  Patient Location: PACU  Anesthesia Type:General and TAP block  Level of Consciousness: awake and alert   Airway and Oxygen Therapy: Patient Spontanous Breathing  Post-op Pain: moderate  Post-op Assessment: Post-op Vital signs reviewed, Patient's Cardiovascular Status Stable and Respiratory Function Stable  Post-op Vital Signs: Reviewed  Filed Vitals:   08/04/13 1230  BP: 143/68  Pulse: 66  Temp:   Resp: 15    Complications: No apparent anesthesia complications

## 2013-08-05 ENCOUNTER — Encounter (HOSPITAL_BASED_OUTPATIENT_CLINIC_OR_DEPARTMENT_OTHER): Payer: Self-pay | Admitting: General Surgery

## 2013-08-29 ENCOUNTER — Ambulatory Visit (INDEPENDENT_AMBULATORY_CARE_PROVIDER_SITE_OTHER): Payer: Medicare Other | Admitting: General Surgery

## 2013-08-29 ENCOUNTER — Encounter (INDEPENDENT_AMBULATORY_CARE_PROVIDER_SITE_OTHER): Payer: Self-pay | Admitting: General Surgery

## 2013-08-29 VITALS — BP 142/80 | HR 64 | Temp 98.0°F | Resp 14 | Ht 71.0 in | Wt 197.8 lb

## 2013-08-29 DIAGNOSIS — K4090 Unilateral inguinal hernia, without obstruction or gangrene, not specified as recurrent: Secondary | ICD-10-CM

## 2013-08-29 NOTE — Patient Instructions (Signed)
You are recovering from your left inguinal hernia repair without any obvious complications.  Try to walk more and more each day.  Avoid sports or heavy lifting until after Labor Day. No restriction of activities after that time  Return to see Dr. Dalbert Batman if necessary.

## 2013-08-29 NOTE — Progress Notes (Signed)
Patient ID: Tommy Hobbs, male   DOB: 07/30/1940, 73 y.o.   MRN: [id number] History: This gentleman underwent open repair of left inguinal hernia with mesh on 08/04/2013. He is here with his wife. He says he is doing very well. No problems with pain over the wound.  Exam: Patient looks well. No distress Left groin incision healing nicely. Tissues are soft. No fluid or infection. Hernia repair intact. Penis scrotum and testes feel normal.  Assessment: Left inguinal hernia, recovering uneventfully 3-1/2 weeks postop open repair with mesh  Plan:   diet and activities discussed. No sports or heavy lifting until after Labor Day Return to see me as needed.    Edsel Petrin. Dalbert Batman, M.D., [facility] General and Minimally invasive Surgery Breast and Colorectal Surgery Office:   [phone] Pager:   [phone]

## 2014-01-12 DIAGNOSIS — R1012 Left upper quadrant pain: Secondary | ICD-10-CM | POA: Diagnosis not present

## 2014-01-12 DIAGNOSIS — J40 Bronchitis, not specified as acute or chronic: Secondary | ICD-10-CM | POA: Diagnosis not present

## 2014-01-12 DIAGNOSIS — J449 Chronic obstructive pulmonary disease, unspecified: Secondary | ICD-10-CM | POA: Diagnosis not present

## 2014-01-27 DIAGNOSIS — J40 Bronchitis, not specified as acute or chronic: Secondary | ICD-10-CM | POA: Diagnosis not present

## 2014-01-27 DIAGNOSIS — R1012 Left upper quadrant pain: Secondary | ICD-10-CM | POA: Diagnosis not present

## 2014-01-27 DIAGNOSIS — J449 Chronic obstructive pulmonary disease, unspecified: Secondary | ICD-10-CM | POA: Diagnosis not present

## 2014-01-27 DIAGNOSIS — J029 Acute pharyngitis, unspecified: Secondary | ICD-10-CM | POA: Diagnosis not present

## 2014-01-28 DIAGNOSIS — H900 Conductive hearing loss, bilateral: Secondary | ICD-10-CM | POA: Diagnosis not present

## 2014-01-28 DIAGNOSIS — H6503 Acute serous otitis media, bilateral: Secondary | ICD-10-CM | POA: Diagnosis not present

## 2014-03-26 DIAGNOSIS — Z8719 Personal history of other diseases of the digestive system: Secondary | ICD-10-CM | POA: Diagnosis not present

## 2014-03-26 DIAGNOSIS — Z9889 Other specified postprocedural states: Secondary | ICD-10-CM | POA: Diagnosis not present

## 2014-03-26 DIAGNOSIS — R1030 Lower abdominal pain, unspecified: Secondary | ICD-10-CM | POA: Diagnosis not present

## 2014-03-26 DIAGNOSIS — Z9049 Acquired absence of other specified parts of digestive tract: Secondary | ICD-10-CM | POA: Diagnosis not present

## 2014-03-26 DIAGNOSIS — Z9079 Acquired absence of other genital organ(s): Secondary | ICD-10-CM | POA: Diagnosis not present

## 2014-03-31 DIAGNOSIS — J449 Chronic obstructive pulmonary disease, unspecified: Secondary | ICD-10-CM | POA: Diagnosis not present

## 2014-03-31 DIAGNOSIS — K209 Esophagitis, unspecified: Secondary | ICD-10-CM | POA: Diagnosis not present

## 2014-06-23 DIAGNOSIS — R1012 Left upper quadrant pain: Secondary | ICD-10-CM | POA: Diagnosis not present

## 2014-06-23 DIAGNOSIS — J029 Acute pharyngitis, unspecified: Secondary | ICD-10-CM | POA: Diagnosis not present

## 2014-06-23 DIAGNOSIS — J449 Chronic obstructive pulmonary disease, unspecified: Secondary | ICD-10-CM | POA: Diagnosis not present

## 2014-07-01 DIAGNOSIS — J029 Acute pharyngitis, unspecified: Secondary | ICD-10-CM | POA: Diagnosis not present

## 2014-07-01 DIAGNOSIS — J449 Chronic obstructive pulmonary disease, unspecified: Secondary | ICD-10-CM | POA: Diagnosis not present

## 2014-07-01 DIAGNOSIS — M549 Dorsalgia, unspecified: Secondary | ICD-10-CM | POA: Diagnosis not present

## 2014-07-09 DIAGNOSIS — R1012 Left upper quadrant pain: Secondary | ICD-10-CM | POA: Diagnosis not present

## 2014-07-09 DIAGNOSIS — K5900 Constipation, unspecified: Secondary | ICD-10-CM | POA: Diagnosis not present

## 2014-07-09 DIAGNOSIS — J449 Chronic obstructive pulmonary disease, unspecified: Secondary | ICD-10-CM | POA: Diagnosis not present

## 2014-08-11 ENCOUNTER — Other Ambulatory Visit: Payer: Self-pay | Admitting: Neurosurgery

## 2014-08-11 DIAGNOSIS — M5440 Lumbago with sciatica, unspecified side: Secondary | ICD-10-CM | POA: Diagnosis not present

## 2014-08-11 DIAGNOSIS — S32010A Wedge compression fracture of first lumbar vertebra, initial encounter for closed fracture: Secondary | ICD-10-CM | POA: Diagnosis not present

## 2014-08-11 DIAGNOSIS — M47816 Spondylosis without myelopathy or radiculopathy, lumbar region: Secondary | ICD-10-CM | POA: Diagnosis not present

## 2014-08-11 DIAGNOSIS — M549 Dorsalgia, unspecified: Secondary | ICD-10-CM | POA: Diagnosis not present

## 2014-08-11 DIAGNOSIS — M5136 Other intervertebral disc degeneration, lumbar region: Secondary | ICD-10-CM | POA: Diagnosis not present

## 2014-08-13 ENCOUNTER — Encounter (HOSPITAL_COMMUNITY): Payer: Self-pay

## 2014-08-13 ENCOUNTER — Encounter (HOSPITAL_COMMUNITY)
Admission: RE | Admit: 2014-08-13 | Discharge: 2014-08-13 | Disposition: A | Discharge status: Home / Self Care | Payer: Medicare Other | Source: Ambulatory Visit | Attending: Neurosurgery | Admitting: Neurosurgery

## 2014-08-13 DIAGNOSIS — M4856XA Collapsed vertebra, not elsewhere classified, lumbar region, initial encounter for fracture: Secondary | ICD-10-CM | POA: Insufficient documentation

## 2014-08-13 DIAGNOSIS — Z01812 Encounter for preprocedural laboratory examination: Secondary | ICD-10-CM | POA: Insufficient documentation

## 2014-08-13 HISTORY — DX: Headache: R51

## 2014-08-13 HISTORY — DX: Post-traumatic stress disorder, unspecified: F43.10

## 2014-08-13 HISTORY — DX: Constipation, unspecified: K59.00

## 2014-08-13 HISTORY — DX: Personal history of urinary calculi: Z87.442

## 2014-08-13 HISTORY — DX: Headache, unspecified: R51.9

## 2014-08-13 LAB — CBC
HEMATOCRIT: 46.2 % (ref 39.0–52.0)
Hemoglobin: 16.3 g/dL (ref 13.0–17.0)
MCH: 34 pg (ref 26.0–34.0)
MCHC: 35.3 g/dL (ref 30.0–36.0)
MCV: 96.3 fL (ref 78.0–100.0)
PLATELETS: 202 10*3/uL (ref 150–400)
RBC: 4.8 MIL/uL (ref 4.22–5.81)
RDW: 12.4 % (ref 11.5–15.5)
WBC: 9 10*3/uL (ref 4.0–10.5)

## 2014-08-13 LAB — SURGICAL PCR SCREEN
MRSA, PCR: NEGATIVE
Staphylococcus aureus: NEGATIVE

## 2014-08-13 NOTE — Progress Notes (Addendum)
PCP is Dr [name].  Patient denies chest pain or shortness of breath.  BP was 170/92, patient reported that it is not usually elevated, but was also elevated at Dr Donnella Bi office  I called Dr Ainsley Spinner office for records, but the office is closed until 08/17/14.

## 2014-08-13 NOTE — Pre-Procedure Instructions (Addendum)
    Tommy Hobbs  08/13/2014      Your procedure is scheduled on Monday, August 8.  Report to [facility] Admitting at 5:30 A.M.                 Your surgery is scheduled for 7:30 AM  Call this number if you have problems the morning of [phone]   Remember:  Do not eat food or drink liquids after midnight Sunday, August 7.  Take these medicines the morning of surgery with A SIP OF WATER : pantoprazole (PROTONIX).                 Stop taking Vitamins, Ibuprofen, Goody Powder.   Do not wear jewelry, make-up or nail polish.  Do not wear lotions, powders, or perfumes.               Men may shave face and neck.  Do not bring valuables to the hospital.  [facility] is not responsible for any belongings or valuables.  Contacts, dentures or bridgework may not be worn into surgery.  Leave your suitcase in the car.  After surgery it may be brought to your room.  For patients admitted to the hospital, discharge time will be determined by your treatment team.  Patients discharged the day of surgery will not be allowed to drive home.   Name and phone number of your driver:  -  Special instructions:  Review  [facility] - Preparing For Surgery.  Please read over the following fact sheets that you were given. Pain Booklet, Coughing and Deep Breathing and Surgical Site Infection Prevention

## 2014-08-17 ENCOUNTER — Ambulatory Visit (HOSPITAL_COMMUNITY): Payer: Medicare Other | Admitting: Anesthesiology

## 2014-08-17 ENCOUNTER — Observation Stay (HOSPITAL_COMMUNITY): Payer: Medicare Other

## 2014-08-17 ENCOUNTER — Encounter (HOSPITAL_COMMUNITY)
Admission: RE | Disposition: A | Discharge status: Home / Self Care | Payer: Self-pay | Source: Ambulatory Visit | Attending: Neurosurgery

## 2014-08-17 ENCOUNTER — Observation Stay (HOSPITAL_COMMUNITY)
Admission: RE | Admit: 2014-08-17 | Discharge: 2014-08-17 | Disposition: A | Discharge status: Home / Self Care | Payer: Medicare Other | Source: Ambulatory Visit | Attending: Neurosurgery | Admitting: Neurosurgery

## 2014-08-17 ENCOUNTER — Encounter (HOSPITAL_COMMUNITY): Payer: Self-pay | Admitting: Surgery

## 2014-08-17 DIAGNOSIS — Z87891 Personal history of nicotine dependence: Secondary | ICD-10-CM | POA: Insufficient documentation

## 2014-08-17 DIAGNOSIS — S32010A Wedge compression fracture of first lumbar vertebra, initial encounter for closed fracture: Secondary | ICD-10-CM | POA: Diagnosis not present

## 2014-08-17 DIAGNOSIS — Z419 Encounter for procedure for purposes other than remedying health state, unspecified: Secondary | ICD-10-CM

## 2014-08-17 DIAGNOSIS — W11XXXA Fall on and from ladder, initial encounter: Secondary | ICD-10-CM | POA: Diagnosis not present

## 2014-08-17 DIAGNOSIS — Z9889 Other specified postprocedural states: Secondary | ICD-10-CM | POA: Diagnosis not present

## 2014-08-17 DIAGNOSIS — S32019D Unspecified fracture of first lumbar vertebra, subsequent encounter for fracture with routine healing: Secondary | ICD-10-CM | POA: Diagnosis not present

## 2014-08-17 DIAGNOSIS — N400 Enlarged prostate without lower urinary tract symptoms: Secondary | ICD-10-CM | POA: Diagnosis not present

## 2014-08-17 DIAGNOSIS — M545 Low back pain: Secondary | ICD-10-CM | POA: Diagnosis not present

## 2014-08-17 HISTORY — PX: KYPHOPLASTY: SHX5884

## 2014-08-17 SURGERY — KYPHOPLASTY
Anesthesia: General | Site: Spine Lumbar

## 2014-08-17 MED ORDER — BISACODYL 10 MG RE SUPP: 10.0000 mg | Freq: Every day | RECTAL | Status: DC | PRN

## 2014-08-17 MED ORDER — CYCLOBENZAPRINE HCL 10 MG PO TABS: 10.0000 mg | ORAL_TABLET | Freq: Three times a day (TID) | ORAL | Status: DC | PRN

## 2014-08-17 MED ORDER — KCL IN DEXTROSE-NACL 20-5-0.45 MEQ/L-%-% IV SOLN
INTRAVENOUS | Status: DC
  Filled 2014-08-17 (×3): qty 1000

## 2014-08-17 MED ORDER — PROPOFOL 10 MG/ML IV BOLUS
INTRAVENOUS | Status: AC
  Filled 2014-08-17: qty 20

## 2014-08-17 MED ORDER — KETOROLAC TROMETHAMINE 30 MG/ML IJ SOLN
30.0000 mg | Freq: Once | INTRAMUSCULAR | Status: AC | PRN
  Administered 2014-08-17: 30 mg via INTRAVENOUS

## 2014-08-17 MED ORDER — SUGAMMADEX SODIUM 500 MG/5ML IV SOLN
INTRAVENOUS | Status: AC
  Filled 2014-08-17: qty 5

## 2014-08-17 MED ORDER — FENTANYL CITRATE (PF) 250 MCG/5ML IJ SOLN
INTRAMUSCULAR | Status: AC
  Filled 2014-08-17: qty 5

## 2014-08-17 MED ORDER — ROCURONIUM BROMIDE 100 MG/10ML IV SOLN
INTRAVENOUS | Status: DC | PRN
  Administered 2014-08-17: 50 mg via INTRAVENOUS

## 2014-08-17 MED ORDER — FENTANYL CITRATE (PF) 100 MCG/2ML IJ SOLN
INTRAMUSCULAR | Status: DC | PRN
  Administered 2014-08-17: 25 ug via INTRAVENOUS
  Administered 2014-08-17: 75 ug via INTRAVENOUS
  Administered 2014-08-17: 25 ug via INTRAVENOUS

## 2014-08-17 MED ORDER — MORPHINE SULFATE 4 MG/ML IJ SOLN: 4.0000 mg | INTRAMUSCULAR | Status: DC | PRN

## 2014-08-17 MED ORDER — HYDROMORPHONE HCL 1 MG/ML IJ SOLN
0.2500 mg | INTRAMUSCULAR | Status: DC | PRN
  Administered 2014-08-17 (×2): 0.5 mg via INTRAVENOUS

## 2014-08-17 MED ORDER — HYDROCODONE-ACETAMINOPHEN 5-325 MG PO TABS: 1.0000 | ORAL_TABLET | ORAL | Status: DC | PRN

## 2014-08-17 MED ORDER — MAGNESIUM HYDROXIDE 400 MG/5ML PO SUSP: 30.0000 mL | Freq: Every day | ORAL | Status: DC | PRN

## 2014-08-17 MED ORDER — SUCCINYLCHOLINE CHLORIDE 20 MG/ML IJ SOLN
INTRAMUSCULAR | Status: AC
  Filled 2014-08-17: qty 1

## 2014-08-17 MED ORDER — KETOROLAC TROMETHAMINE 30 MG/ML IJ SOLN
30.0000 mg | Freq: Four times a day (QID) | INTRAMUSCULAR | Status: DC
  Administered 2014-08-17: 30 mg via INTRAVENOUS
  Filled 2014-08-17: qty 1

## 2014-08-17 MED ORDER — ROCURONIUM BROMIDE 50 MG/5ML IV SOLN
INTRAVENOUS | Status: AC
  Filled 2014-08-17: qty 1

## 2014-08-17 MED ORDER — ACETAMINOPHEN 10 MG/ML IV SOLN
INTRAVENOUS | Status: AC
  Administered 2014-08-17: 1000 mg via INTRAVENOUS
  Filled 2014-08-17: qty 100

## 2014-08-17 MED ORDER — PHENYLEPHRINE 40 MCG/ML (10ML) SYRINGE FOR IV PUSH (FOR BLOOD PRESSURE SUPPORT)
PREFILLED_SYRINGE | INTRAVENOUS | Status: AC
  Filled 2014-08-17: qty 10

## 2014-08-17 MED ORDER — MIDAZOLAM HCL 2 MG/2ML IJ SOLN
INTRAMUSCULAR | Status: AC
  Filled 2014-08-17: qty 4

## 2014-08-17 MED ORDER — VANCOMYCIN HCL IN DEXTROSE 1-5 GM/200ML-% IV SOLN
1000.0000 mg | INTRAVENOUS | Status: DC
  Filled 2014-08-17: qty 200

## 2014-08-17 MED ORDER — ACETAMINOPHEN 325 MG PO TABS: 650.0000 mg | ORAL_TABLET | ORAL | Status: DC | PRN

## 2014-08-17 MED ORDER — ACETAMINOPHEN 650 MG RE SUPP
650.0000 mg | RECTAL | Status: DC | PRN
  Filled 2014-08-17: qty 1

## 2014-08-17 MED ORDER — ONDANSETRON HCL 4 MG PO TABS
4.0000 mg | ORAL_TABLET | Freq: Four times a day (QID) | ORAL | Status: DC | PRN
Start: 2014-08-17 — End: 2014-08-17

## 2014-08-17 MED ORDER — IOHEXOL 300 MG/ML  SOLN
INTRAMUSCULAR | Status: DC | PRN
  Administered 2014-08-17: 50 mL

## 2014-08-17 MED ORDER — KETOROLAC TROMETHAMINE 30 MG/ML IJ SOLN: 30.0000 mg | Freq: Once | INTRAMUSCULAR | Status: DC

## 2014-08-17 MED ORDER — DEXAMETHASONE SODIUM PHOSPHATE 4 MG/ML IJ SOLN
INTRAMUSCULAR | Status: AC
  Filled 2014-08-17: qty 1

## 2014-08-17 MED ORDER — ONDANSETRON HCL 4 MG/2ML IJ SOLN
INTRAMUSCULAR | Status: DC | PRN
  Administered 2014-08-17: 4 mg via INTRAVENOUS

## 2014-08-17 MED ORDER — OXYCODONE-ACETAMINOPHEN 5-325 MG PO TABS: 1.0000 | ORAL_TABLET | ORAL | Status: DC | PRN

## 2014-08-17 MED ORDER — ONDANSETRON HCL 4 MG/2ML IJ SOLN
INTRAMUSCULAR | Status: AC
  Filled 2014-08-17: qty 2

## 2014-08-17 MED ORDER — KETOROLAC TROMETHAMINE 30 MG/ML IJ SOLN
INTRAMUSCULAR | Status: AC
  Filled 2014-08-17: qty 1

## 2014-08-17 MED ORDER — SUCCINYLCHOLINE CHLORIDE 20 MG/ML IJ SOLN: INTRAMUSCULAR | Status: DC | PRN

## 2014-08-17 MED ORDER — LIDOCAINE HCL (CARDIAC) 20 MG/ML IV SOLN
INTRAVENOUS | Status: DC | PRN
  Administered 2014-08-17: 60 mg via INTRAVENOUS

## 2014-08-17 MED ORDER — MENTHOL 3 MG MT LOZG: 1.0000 | LOZENGE | OROMUCOSAL | Status: DC | PRN

## 2014-08-17 MED ORDER — HYDROXYZINE HCL 25 MG PO TABS: 50.0000 mg | ORAL_TABLET | ORAL | Status: DC | PRN

## 2014-08-17 MED ORDER — ALUM & MAG HYDROXIDE-SIMETH 200-200-20 MG/5ML PO SUSP: 30.0000 mL | Freq: Four times a day (QID) | ORAL | Status: DC | PRN

## 2014-08-17 MED ORDER — STERILE WATER FOR INJECTION IJ SOLN
INTRAMUSCULAR | Status: AC
  Filled 2014-08-17: qty 10

## 2014-08-17 MED ORDER — DEXAMETHASONE SODIUM PHOSPHATE 4 MG/ML IJ SOLN
INTRAMUSCULAR | Status: DC | PRN
  Administered 2014-08-17: 4 mg via INTRAVENOUS

## 2014-08-17 MED ORDER — MIDAZOLAM HCL 5 MG/5ML IJ SOLN
INTRAMUSCULAR | Status: DC | PRN
  Administered 2014-08-17: 2 mg via INTRAVENOUS

## 2014-08-17 MED ORDER — PROPOFOL 10 MG/ML IV BOLUS
INTRAVENOUS | Status: DC | PRN
Start: 2014-08-17 — End: 2014-08-17
  Administered 2014-08-17: 100 mg via INTRAVENOUS

## 2014-08-17 MED ORDER — SODIUM CHLORIDE 0.9 % IJ SOLN: 3.0000 mL | Freq: Two times a day (BID) | INTRAMUSCULAR | Status: DC

## 2014-08-17 MED ORDER — ONDANSETRON HCL 4 MG/2ML IJ SOLN: 4.0000 mg | Freq: Four times a day (QID) | INTRAMUSCULAR | Status: DC | PRN

## 2014-08-17 MED ORDER — SODIUM CHLORIDE 0.9 % IJ SOLN: 3.0000 mL | INTRAMUSCULAR | Status: DC | PRN

## 2014-08-17 MED ORDER — SODIUM CHLORIDE 0.9 % IV SOLN
250.0000 mL | INTRAVENOUS | Status: DC
Start: 2014-08-17 — End: 2014-08-17

## 2014-08-17 MED ORDER — PHENOL 1.4 % MT LIQD: 1.0000 | OROMUCOSAL | Status: DC | PRN

## 2014-08-17 MED ORDER — CEFAZOLIN SODIUM-DEXTROSE 2-3 GM-% IV SOLR
INTRAVENOUS | Status: DC | PRN
  Administered 2014-08-17: 2 g via INTRAVENOUS

## 2014-08-17 MED ORDER — BUPIVACAINE HCL (PF) 0.25 % IJ SOLN
INTRAMUSCULAR | Status: DC | PRN
  Administered 2014-08-17: 4.5 mL

## 2014-08-17 MED ORDER — PROMETHAZINE HCL 25 MG/ML IJ SOLN: 6.2500 mg | INTRAMUSCULAR | Status: DC | PRN

## 2014-08-17 MED ORDER — LACTATED RINGERS IV SOLN
INTRAVENOUS | Status: DC | PRN
Start: 2014-08-17 — End: 2014-08-17
  Administered 2014-08-17: 07:00:00 via INTRAVENOUS

## 2014-08-17 MED ORDER — EPHEDRINE SULFATE 50 MG/ML IJ SOLN
INTRAMUSCULAR | Status: AC
  Filled 2014-08-17: qty 1

## 2014-08-17 MED ORDER — LIDOCAINE-EPINEPHRINE 1 %-1:100000 IJ SOLN
INTRAMUSCULAR | Status: DC | PRN
  Administered 2014-08-17: 4.5 mL

## 2014-08-17 MED ORDER — PHENYLEPHRINE HCL 10 MG/ML IJ SOLN
INTRAMUSCULAR | Status: DC | PRN
  Administered 2014-08-17 (×5): 80 ug via INTRAVENOUS

## 2014-08-17 MED ORDER — HYDROXYZINE HCL 50 MG/ML IM SOLN
50.0000 mg | INTRAMUSCULAR | Status: DC | PRN
  Filled 2014-08-17: qty 1

## 2014-08-17 MED ORDER — 0.9 % SODIUM CHLORIDE (POUR BTL) OPTIME
TOPICAL | Status: DC | PRN
  Administered 2014-08-17: 1000 mL

## 2014-08-17 MED ORDER — SUGAMMADEX SODIUM 200 MG/2ML IV SOLN
INTRAVENOUS | Status: DC | PRN
  Administered 2014-08-17: 400 mg via INTRAVENOUS

## 2014-08-17 MED ORDER — HYDROMORPHONE HCL 1 MG/ML IJ SOLN
INTRAMUSCULAR | Status: AC
  Filled 2014-08-17: qty 1

## 2014-08-17 SURGICAL SUPPLY — 43 items
BANDAGE ADH SHEER 1  50/CT (GAUZE/BANDAGES/DRESSINGS) IMPLANT
BLADE CLIPPER SURG (BLADE) IMPLANT
BLADE SURG 11 STRL SS (BLADE) ×2 IMPLANT
CEMENT BONE KYPHX HV R (Orthopedic Implant) ×2 IMPLANT
CEMENT KYPHON C01A KIT/MIXER (Cement) ×2 IMPLANT
DECANTER SPIKE VIAL GLASS SM (MISCELLANEOUS) IMPLANT
DERMABOND ADVANCED (GAUZE/BANDAGES/DRESSINGS) ×1
DERMABOND ADVANCED .7 DNX12 (GAUZE/BANDAGES/DRESSINGS) ×1 IMPLANT
DRAPE C-ARM 42X72 X-RAY (DRAPES) ×2 IMPLANT
DRAPE INCISE IOBAN 66X45 STRL (DRAPES) ×2 IMPLANT
DRAPE LAPAROTOMY 100X72X124 (DRAPES) ×2 IMPLANT
DRAPE PROXIMA HALF (DRAPES) ×2 IMPLANT
DURAPREP 26ML APPLICATOR (WOUND CARE) ×2 IMPLANT
GAUZE SPONGE 4X4 16PLY XRAY LF (GAUZE/BANDAGES/DRESSINGS) ×2 IMPLANT
GLOVE BIOGEL PI IND STRL 7.0 (GLOVE) ×1 IMPLANT
GLOVE BIOGEL PI IND STRL 8 (GLOVE) ×1 IMPLANT
GLOVE BIOGEL PI INDICATOR 7.0 (GLOVE) ×1
GLOVE BIOGEL PI INDICATOR 8 (GLOVE) ×1
GLOVE ECLIPSE 7.5 STRL STRAW (GLOVE) ×2 IMPLANT
GLOVE EXAM NITRILE LRG STRL (GLOVE) IMPLANT
GLOVE EXAM NITRILE MD LF STRL (GLOVE) IMPLANT
GLOVE EXAM NITRILE XL STR (GLOVE) IMPLANT
GLOVE EXAM NITRILE XS STR PU (GLOVE) IMPLANT
GLOVE SURG SS PI 7.0 STRL IVOR (GLOVE) ×2 IMPLANT
GOWN STRL REUS W/ TWL LRG LVL3 (GOWN DISPOSABLE) IMPLANT
GOWN STRL REUS W/ TWL XL LVL3 (GOWN DISPOSABLE) ×2 IMPLANT
GOWN STRL REUS W/TWL 2XL LVL3 (GOWN DISPOSABLE) IMPLANT
GOWN STRL REUS W/TWL LRG LVL3 (GOWN DISPOSABLE)
GOWN STRL REUS W/TWL XL LVL3 (GOWN DISPOSABLE) ×2
KIT BASIN OR (CUSTOM PROCEDURE TRAY) ×2 IMPLANT
KIT ROOM TURNOVER OR (KITS) ×2 IMPLANT
NEEDLE HYPO 25X1 1.5 SAFETY (NEEDLE) ×2 IMPLANT
NS IRRIG 1000ML POUR BTL (IV SOLUTION) ×2 IMPLANT
PACK SURGICAL SETUP 50X90 (CUSTOM PROCEDURE TRAY) ×2 IMPLANT
PAD ARMBOARD 7.5X6 YLW CONV (MISCELLANEOUS) ×10 IMPLANT
SPECIMEN JAR SMALL (MISCELLANEOUS) IMPLANT
SUT VIC AB 3-0 SH 8-18 (SUTURE) ×2 IMPLANT
SUT VIC AB 4-0 P-3 18X BRD (SUTURE) IMPLANT
SUT VIC AB 4-0 P3 18 (SUTURE)
SYR CONTROL 10ML LL (SYRINGE) ×2 IMPLANT
TOWEL OR 17X24 6PK STRL BLUE (TOWEL DISPOSABLE) ×2 IMPLANT
TOWEL OR 17X26 10 PK STRL BLUE (TOWEL DISPOSABLE) ×2 IMPLANT
TRAY KYPHOPAK 20/3 ONESTEP 1ST (MISCELLANEOUS) ×2 IMPLANT

## 2014-08-17 NOTE — Transfer of Care (Signed)
Immediate Anesthesia Transfer of Care Note  Patient: Tommy Hobbs  Procedure(s) Performed: Procedure(s): KYPHOPLASTY-LUMBAR ONE (N/A)  Patient Location: PACU  Anesthesia Type:General  Level of Consciousness: awake, alert  and oriented  Airway & Oxygen Therapy: Patient Spontanous Breathing and Patient connected to nasal cannula oxygen  Post-op Assessment: Report given to RN, Post -op Vital signs reviewed and stable and Patient moving all extremities X 4  Post vital signs: Reviewed and stable  Last Vitals:  Filed Vitals:   08/17/14 0611  BP: 275/80  Pulse:   Temp:   Resp:     Complications: No apparent anesthesia complications

## 2014-08-17 NOTE — Anesthesia Procedure Notes (Signed)
Procedure Name: Intubation Date/Time: 08/17/2014 7:33 AM Performed by: Ollen Bowl Pre-anesthesia Checklist: Patient identified, Timeout performed, Emergency Drugs available, Suction available and Patient being monitored Patient Re-evaluated:Patient Re-evaluated prior to inductionOxygen Delivery Method: Circle system utilized and Simple face mask Preoxygenation: Pre-oxygenation with 100% oxygen Intubation Type: IV induction Ventilation: Mask ventilation without difficulty Laryngoscope Size: Miller and 3 Grade View: Grade I Tube type: Oral Tube size: 7.5 mm Number of attempts: 1 Airway Equipment and Method: Patient positioned with wedge pillow and Stylet Placement Confirmation: ETT inserted through vocal cords under direct vision,  positive ETCO2 and breath sounds checked- equal and bilateral Secured at: 23 cm Tube secured with: Tape Dental Injury: Teeth and Oropharynx as per pre-operative assessment

## 2014-08-17 NOTE — H&P (Signed)
Subjective: Patient is a 74 y.o. right handed white male who is admitted for treatment of L1 compression fracture he sustained after falling from a ladder on  07/01/14.  Patient continues to have pain in the upper lumbar region, that extends down towards the lower lumbar region, as well as around the torso.  The pain is aggravated by standing and walking. He rarely has discomfort into his thighs and legs.  He is admitted for an L1 kyphoplasty.    Patient Active Problem List   Diagnosis Date Noted  . Left inguinal hernia 07/17/2013   Past Medical History  Diagnosis Date  . Wears dentures     full top  . Arthritis   . GERD (gastroesophageal reflux disease)   . History of BPH   . DDD (degenerative disc disease)   . Dysrhythmia     history svt-clean cath 2004- Had SVT "One Time."  . Headache   . PTSD (post-traumatic stress disorder)   . History of kidney stones     x 1 passed  . Constipation     Past Surgical History  Procedure Laterality Date  . Cardiac catheterization  2004  . Cervical fusion  2011  . Appendectomy  1977  . Cholecystectomy  1977    open  . Inguinal hernia repair Right 1977    right  . Suprapubic prostatectomy  2012    partial for severe BPH  . Cataract extraction, bilateral Bilateral   . Colonoscopy    . Inguinal hernia repair Left 08/04/2013    Procedure: OPEN LEFT INGUINAL HERNIA REPAIR;  Surgeon: Adin Hector, MD;  Location: [place];  Service: General;  Laterality: Left;  . Insertion of mesh N/A 08/04/2013    Procedure: INSERTION OF MESH;  Surgeon: Adin Hector, MD;  Location: [place];  Service: General;  Laterality: N/A;  . Knee arthroscopy Left     Prescriptions prior to admission  Medication Sig Dispense Refill Last Dose  . Aspirin-Acetaminophen (GOODYS BODY PAIN PO) Take 1 packet by mouth as needed (pain).   Past Week at Unknown time  . docusate sodium (COLACE) 100 MG capsule Take 200 mg by mouth daily.   Past  Week at Unknown time  . ibuprofen (ADVIL,MOTRIN) 200 MG tablet Take 800 mg by mouth every 8 (eight) hours as needed.   Past Week at Unknown time  . Multiple Vitamin (MULITIVITAMIN WITH MINERALS) TABS Take 1 tablet by mouth daily.   Past Week at Unknown time  . pantoprazole (PROTONIX) 40 MG tablet Take 40 mg by mouth daily.   08/17/2014 at 0500  . Psyllium (VEGETABLE LAXATIVE PO) Take 1 tablet by mouth at bedtime. [name] Brand   Past Week at Unknown time   Allergies  Allergen Reactions  . Other Hives and Rash    Pt has issues with certain ingredients in lotion.  imidazolidinyl urea, DMDM hydantoin, 2-bromo-2-nitropropane-1, diazolidinyl urea, quaternium 15, cocamidopropyl dimethyl glycine  . Cocamidopropyl Betaine Hives and Rash  . Esomeprazole Magnesium Hives and Rash  . Tegopen [Cloxacillin Sodium] Rash    History  Substance Use Topics  . Smoking status: Former Smoker -- 30 years    Quit date: 07/31/1998  . Smokeless tobacco: Not on file  . Alcohol Use: No    History reviewed. No pertinent family history.   Review of Systems A comprehensive review of systems was negative.  Objective: Vital signs in last 24 hours: Temp:  [97.9 F (36.6 C)] 97.9 F (36.6  C) (08/08 5681) Pulse Rate:  [78] 78 (08/08 0609) Resp:  [20] 20 (08/08 0609) BP: (275)/(80) 275/80 mmHg (08/08 0611) SpO2:  [98 %] 98 % (08/08 0609) Weight:  [88.905 kg (196 lb)] 88.905 kg (196 lb) (08/08 0609)  EXAM: Patient is a well developed, well nourished white male in no acute distress.   Lungs are clear to auscultation , the patient has symmetrical respiratory excursion. Heart has a regular rate and rhythm normal S1 and S2 no murmur.   Abdomen is soft nontender nondistended bowel sounds are present. Extremity examination shows no clubbing cyanosis or edema. Motor examination shows 5 over 5 strength in the lower extremities including the iliopsoas quadriceps dorsiflexor extensor hallicus  longus and plantar flexor  bilaterally. Sensation is intact to pinprick in the distal lower extremities. Reflexes are symmetrical bilaterally. No pathologic reflexes are present. Patient has a normal gait and stance.   Data Review:CBC    Component Value Date/Time   WBC 9.0 08/13/2014 1047   RBC 4.80 08/13/2014 1047   HGB 16.3 08/13/2014 1047   HCT 46.2 08/13/2014 1047   PLT 202 08/13/2014 1047   MCV 96.3 08/13/2014 1047   MCH 34.0 08/13/2014 1047   MCHC 35.3 08/13/2014 1047   RDW 12.4 08/13/2014 1047   LYMPHSABS 1.8 07/31/2013 1015   MONOABS 0.7 07/31/2013 1015   EOSABS 0.2 07/31/2013 1015   BASOSABS 0.0 07/31/2013 1015                          BMET    Component Value Date/Time   NA 135* 07/31/2013 1015   K 4.7 07/31/2013 1015   CL 97 07/31/2013 1015   CO2 26 07/31/2013 1015   GLUCOSE 103* 07/31/2013 1015   BUN 13 07/31/2013 1015   CREATININE 0.90 07/31/2013 1015   CALCIUM 9.5 07/31/2013 1015   GFRNONAA 84* 07/31/2013 1015   GFRAA >90 07/31/2013 1015     Assessment/Plan: Patient who fell from a ladder nearly 7 weeks ago, suffering an L1 compression fracture, continuing to have significantly limiting pain, who is admitted for an L1 kyphoplasty.  I've discussed with the patient the nature of his condition, the nature the surgical procedure, the typical length of surgery, hospital stay, and overall recuperation. We discussed limitations postoperatively. I discussed risks of surgery including risks of infection, bleeding, possibly need for transfusion, the risk of nerve root dysfunction with pain, weakness, numbness, or paresthesias, or risk of dural tear and CSF leakage and possible need for further surgery, and the risk of anesthetic complications including myocardial infarction, stroke, pneumonia, and death. Understanding all this the patient does wish to proceed with surgery and is admitted for such.   Hosie Spangle, MD 08/17/2014 6:33 AM

## 2014-08-17 NOTE — Discharge Summary (Signed)
Physician Discharge Summary  Patient ID: Tommy Hobbs MRN: [id number] DOB/AGE: Apr 11, 1940 74 y.o.  Admit date: 08/17/2014 Discharge date: 08/17/2014  Admission Diagnoses:  L1 compression fracture, lumbago  Discharge Diagnoses:  L1 compression fracture, lumbago Active Problems:   Compression fracture of L1 lumbar vertebra   Discharged Condition: good  Hospital Course: She was admitted, underwent an L1 kyphoplasty. He's had excellent relief of the pain. He is up and ambulate actively. He is voiding. He is asking to be discharged home. He has been given instructions regarding wound care and activities. He is to return for follow-up with me in about 3-4 weeks.  Discharge Exam: Blood pressure 144/70, pulse 70, temperature 97.7 F (36.5 C), temperature source Oral, resp. rate 16, height 6' (1.829 m), weight 88.905 kg (196 lb), SpO2 99 %.  Disposition: 01-Home or Self Care     Medication List    TAKE these medications        docusate sodium 100 MG capsule  Commonly known as:  COLACE  Take 200 mg by mouth daily.     GOODYS BODY PAIN PO  Take 1 packet by mouth as needed (pain).     ibuprofen 200 MG tablet  Commonly known as:  ADVIL,MOTRIN  Take 800 mg by mouth every 8 (eight) hours as needed.     multivitamin with minerals Tabs tablet  Take 1 tablet by mouth daily.     pantoprazole 40 MG tablet  Commonly known as:  PROTONIX  Take 40 mg by mouth daily.     VEGETABLE LAXATIVE PO  Take 1 tablet by mouth at bedtime. [name] Brand         SignedHosie Spangle 08/17/2014, 5:46 PM

## 2014-08-17 NOTE — Discharge Instructions (Signed)
Wound Care Leave incision open to air. You may shower. Do not scrub directly on incision.  Do not put any creams, lotions, or ointments on incision. Activity Walk each and every day, increasing distance each day. No lifting greater than 5 lbs.  Avoid bending, arching, and twisting. No driving for 2 weeks; may ride as a passenger locally. If provided with back brace, wear when out of bed.  It is not necessary to wear in bed. Diet Resume your normal diet.  Return to Work Will be discussed at you follow up appointment. Call Your Doctor If Any of These Occur Redness, drainage, or swelling at the wound.  Temperature greater than 101 degrees. Severe pain not relieved by pain medication. Incision starts to come apart. Follow Up Appt Call today for appointment in 3 weeks ([phone]) or for problems.  If you have any hardware placed in your spine, you will need an x-ray before your appointment.  Balloon Kyphoplasty Balloon kyphoplasty is a procedure in which orthopedic balloons are used to gently raise a collapsed vertebral body in an attempt to alleviate pain. This condition is also called a vertebral compression fracture, or VCF. Most often the cause of this collapse is due to osteoporosis of the vertebral body, which makes the bone susceptible to minor trauma. Osteoporosis is a condition that comes on with aging. Osteoporosis is due to a loss of mineral from the bone. This causes a softening of the bones. The diagnosis of these fractures is usually made with X-rays or magnetic resonance imaging (MRI). Some advantages of this procedure are:  Pain relief.  Restoration of vertebral body height.  Correction of spinal deformity.  Relatively low complication rate. The procedure is usually done by orthopedic surgeons, neurosurgeons, interventional radiologists, and interventional neuroradiologists who specialize in treating the spine with balloon kyphoplasty.  This procedure is not useful  for:  Patients with young, healthy bones or those who have sustained a vertebral body fracture or collapse in a major accident.  Patients with spinal curvature such as scoliosis or kyphosis that is due to causes other than osteoporosis.  Patients who suffer from spinal stenosis or herniated discs with nerve or spinal cord compression and loss of neurological function not associated with a vertebral compression fracture.  Patients with known metastatic disease of the spine. THE BENEFITS OF BALLOON KYPHOPLASTY INCLUDE:  Reduction in back pain. If there is pain due to the procedure, it will typically lessen within two weeks.  Improved quality of life.  Improved mobility (you can get around better).  Improved ability to perform activities of daily living. PROCEDURE  The kyphoplasty procedure involves the use of a balloon to restore the vertebral body height and shape. This is followed by placement of bone cement to strengthen it. The procedure may be done under intravenous sedation, local anesthetic, or general anesthetic. The patient lies face-down on the operating room table. X-ray machines are used to show the collapsed bones.  The surgeon makes two small incisions. A tube is then inserted into the center of the vertebral body. Through this tube, balloons are placed in the vertebral body. Then the balloons are inflated. This creates a cavity. This pushes the bone back toward its normal height and shape.  Once the cavity is created, the surgeon removes the inflatable balloon. The cement is mixed and used to fill the cavity in a slow and controlled fashion. The cement hardens. Then the surgeon takes out the tubes. The incisions are closed with a single stitch.  Patients usually go home the same day. Patients can go back to all normal activities of daily living as soon as possible. There are no restrictions.  RISKS AND COMPLICATIONS As with any surgery, there are potential risks. Although the  procedure is designed to minimize risks, complications may occur. Be sure to discuss the risks with your caregiver. Balloon kyphoplasty is not right for all patients. Complications may require more treatments. Complications that can occur include:  The usual risks of local or general anesthetics apply. These risks depend on the patient's overall health.  Heart attack (myocardial infarction).  Stroke (cerebrovascular accident).  A blockage in the lung (pulmonary embolism - there is a very small chance of the cement traveling to lungs).  There is a small risk of the bone cement leaking from within the boundaries of the vertebral body. In most cases, this rare event does not cause any problems. However, if the cement does leak it may cause:  Pain.  Altered sensation.  Very rarely, paralysis.  Should the cement leak further, more significant surgery may be needed to stop the irritation of the nerves or spinal cord.  In very rare circumstances, the cement may irritate or damage the spinal cord or nerves.  Heart stops beating (cardiac arrest).  Excessive bleeding (hemorrhage).  Infection (there is a small chance of the cement block becoming infected at the time of surgery or even years later). Document Released: 12/02/2003 Document Revised: 05/12/2013 Document Reviewed: 07/07/2008 [facility] Patient Information 2015 [place], [place]. This information is not intended to replace advice given to you by your health care provider. Make sure you discuss any questions you have with your health care provider.

## 2014-08-17 NOTE — Anesthesia Preprocedure Evaluation (Addendum)
Anesthesia Evaluation  Patient identified by MRN, date of birth, ID band Patient awake    Reviewed: Allergy & Precautions, NPO status , Patient's Chart, lab work & pertinent test results  Airway Mallampati: II  TM Distance: >3 FB Neck ROM: Full    Dental no notable dental hx. (+) Edentulous Upper, Dental Advisory Given   Pulmonary neg pulmonary ROS, former smoker,  breath sounds clear to auscultation  Pulmonary exam normal       Cardiovascular negative cardio ROS Normal cardiovascular examRhythm:Regular Rate:Normal     Neuro/Psych negative neurological ROS  negative psych ROS   GI/Hepatic Neg liver ROS, GERD-  Medicated,  Endo/Other  negative endocrine ROS  Renal/GU negative Renal ROS  negative genitourinary   Musculoskeletal negative musculoskeletal ROS (+)   Abdominal   Peds negative pediatric ROS (+)  Hematology negative hematology ROS (+)   Anesthesia Other Findings   Reproductive/Obstetrics negative OB ROS                            Anesthesia Physical Anesthesia Plan  ASA: II  Anesthesia Plan: General   Post-op Pain Management:    Induction: Intravenous  Airway Management Planned: Oral ETT  Additional Equipment:   Intra-op Plan:   Post-operative Plan: Extubation in OR  Informed Consent: I have reviewed the patients History and Physical, chart, labs and discussed the procedure including the risks, benefits and alternatives for the proposed anesthesia with the patient or authorized representative who has indicated his/her understanding and acceptance.   Dental advisory given  Plan Discussed with: CRNA and Surgeon  Anesthesia Plan Comments:         Anesthesia Quick Evaluation

## 2014-08-17 NOTE — Progress Notes (Signed)
Pt doing well. Pt and wife given D/C instructions with verbal understanding. Pt's incision is clean and dry with no sign of infection. Pt's IV was removed prior to D/C. Pt D/C'd home via walking @ 1755 per MD order. Pt is stable @ D/C and has no other needs at this time. Holli Humbles, RN

## 2014-08-17 NOTE — Op Note (Signed)
08/17/2014  8:34 AM  PATIENT:  Tommy Hobbs  74 y.o. male  PRE-OPERATIVE DIAGNOSIS:  L1 compression fracture, lumbago  POST-OPERATIVE DIAGNOSIS:  L1 compression fracture, lumbago  PROCEDURE:  Procedure(s):   KYPHOPLASTY-LUMBAR ONE  SURGEON:  Surgeon(s): Jovita Gamma, MD  ANESTHESIA:   general  EBL:  < 25 cc  BLOOD ADMINISTERED:none  COUNT: Correct per nursing staff  DICTATION: Patient was brought to the operating room, placed under general endotracheal anesthesia. AP and lateral C-arm fluoroscopy units were set up, and the L1 vertebra identified. The thoracolumbar region posteriorly was prepped with DuraPrep, and draped in a sterile fashion. The C-arm fluoroscopy units were also draped in a sterile fashion. The entry points to approach the posterior lateral aspect of the L1 pedicles were identified bilaterally.  The skin and subcutaneous tissue were infiltrated with local anesthetic with epinephrine. 3 mm incisions were made on either side at the entry points. The cannulated trocar was passed down to the posterior lateral entry point of the pedicles, on each side. Each trocar was passed through the pedicle with C-arm fluoroscopic guidance into the vertebral body on either side. We then removed the inner trocar, leaving the outer cannula. Using the drill we created a passage through the vertebral body, to the ventral wall. The balloon expanders were placed in the vertebral body on either side, and gently inflated and expanded under C-arm fluoroscopic guidance. Once good compaction was achieved bilaterally, the balloon expanders were deflated, and we began to fill the cavities that had been created with methylmethacrylate. We filled from one side to the other, using C-arm fluoroscopic guidance, until good filling of the cavity, and good interdigitation with the bone was achieved bilaterally. A total of 6.5 cc of methylmethacrylate was introduced on the right side, a total of 4.5 cc of  methylmethacrylate was introduced on the left side, for a total of 11 cc. We then carefully removed each of the cannulas, using C-arm for guidance. Final imaging showed good filling of the cavity, with no extravasation. Each of the incisions was closed with a single 3-0 undyed Vicryl subcuticular suture, and the Dermabond. Following surgery the patient was turned back to a supine position, to be reversed and the anesthetic, extubated, and transferred to the recovery room for further care.   PLAN OF CARE: Admit for overnight observation  PATIENT DISPOSITION:  PACU - hemodynamically stable.   Delay start of Pharmacological VTE agent (>24hrs) due to surgical blood loss or risk of bleeding:  yes

## 2014-08-17 NOTE — Progress Notes (Signed)
Pt transported per Amy , NT

## 2014-08-17 NOTE — Anesthesia Postprocedure Evaluation (Signed)
  Anesthesia Post-op Note  Patient: Tommy Hobbs  Procedure(s) Performed: Procedure(s) (LRB): KYPHOPLASTY-LUMBAR ONE (N/A)  Patient Location: PACU  Anesthesia Type: General  Level of Consciousness: awake and alert   Airway and Oxygen Therapy: Patient Spontanous Breathing  Post-op Pain: mild  Post-op Assessment: Post-op Vital signs reviewed, Patient's Cardiovascular Status Stable, Respiratory Function Stable, Patent Airway and No signs of Nausea or vomiting  Last Vitals:  Filed Vitals:   08/17/14 0921  BP:   Pulse: 59  Temp:   Resp: 11    Post-op Vital Signs: stable   Complications: No apparent anesthesia complications

## 2014-08-18 ENCOUNTER — Encounter (HOSPITAL_COMMUNITY): Payer: Self-pay | Admitting: Neurosurgery

## 2014-09-11 DIAGNOSIS — M47816 Spondylosis without myelopathy or radiculopathy, lumbar region: Secondary | ICD-10-CM | POA: Diagnosis not present

## 2014-10-06 DIAGNOSIS — Z23 Encounter for immunization: Secondary | ICD-10-CM | POA: Diagnosis not present

## 2014-10-06 DIAGNOSIS — J449 Chronic obstructive pulmonary disease, unspecified: Secondary | ICD-10-CM | POA: Diagnosis not present

## 2014-10-06 DIAGNOSIS — R1012 Left upper quadrant pain: Secondary | ICD-10-CM | POA: Diagnosis not present

## 2014-10-08 DIAGNOSIS — L309 Dermatitis, unspecified: Secondary | ICD-10-CM | POA: Diagnosis not present

## 2015-01-19 DIAGNOSIS — E559 Vitamin D deficiency, unspecified: Secondary | ICD-10-CM | POA: Diagnosis not present

## 2015-01-19 DIAGNOSIS — Z125 Encounter for screening for malignant neoplasm of prostate: Secondary | ICD-10-CM | POA: Diagnosis not present

## 2015-01-19 DIAGNOSIS — J449 Chronic obstructive pulmonary disease, unspecified: Secondary | ICD-10-CM | POA: Diagnosis not present

## 2015-01-19 DIAGNOSIS — M1990 Unspecified osteoarthritis, unspecified site: Secondary | ICD-10-CM | POA: Diagnosis not present

## 2015-01-19 DIAGNOSIS — J029 Acute pharyngitis, unspecified: Secondary | ICD-10-CM | POA: Diagnosis not present

## 2015-01-19 DIAGNOSIS — M549 Dorsalgia, unspecified: Secondary | ICD-10-CM | POA: Diagnosis not present

## 2015-01-19 DIAGNOSIS — I10 Essential (primary) hypertension: Secondary | ICD-10-CM | POA: Diagnosis not present

## 2015-01-19 LAB — HEPATIC FUNCTION PANEL
ALT: 42 — AB (ref 10–40)
AST: 22 (ref 14–40)
Alkaline Phosphatase: 67 (ref 25–125)
Bilirubin, Total: 0.7

## 2015-01-19 LAB — BASIC METABOLIC PANEL
BUN: 16 (ref 4–21)
Creatinine: 1 (ref 0.6–1.3)
GLUCOSE: 87
POTASSIUM: 4.2 (ref 3.4–5.3)
SODIUM: 135 — AB (ref 137–147)

## 2015-01-19 LAB — CBC AND DIFFERENTIAL
HEMATOCRIT: 45 (ref 41–53)
HEMOGLOBIN: 16.2 (ref 13.5–17.5)
PLATELETS: 174 (ref 150–399)
WBC: 6.4

## 2015-01-19 LAB — LIPID PANEL
CHOLESTEROL: 237 — AB (ref 0–200)
HDL: 35 (ref 35–70)
LDL CALC: 161
TRIGLYCERIDES: 207 — AB (ref 40–160)

## 2015-01-19 LAB — PSA: PSA: 9.67

## 2015-01-19 LAB — VITAMIN D 25 HYDROXY (VIT D DEFICIENCY, FRACTURES): Vit D, 25-Hydroxy: 24

## 2015-01-19 LAB — TSH: TSH: 2.12 (ref 0.41–5.90)

## 2015-03-01 DIAGNOSIS — Z0001 Encounter for general adult medical examination with abnormal findings: Secondary | ICD-10-CM | POA: Diagnosis not present

## 2015-03-03 DIAGNOSIS — R972 Elevated prostate specific antigen [PSA]: Secondary | ICD-10-CM | POA: Diagnosis not present

## 2015-05-13 DIAGNOSIS — L568 Other specified acute skin changes due to ultraviolet radiation: Secondary | ICD-10-CM | POA: Diagnosis not present

## 2015-05-13 DIAGNOSIS — L309 Dermatitis, unspecified: Secondary | ICD-10-CM | POA: Diagnosis not present

## 2015-05-13 IMAGING — CT CT ABD-PELV W/ CM
2 of 5 series · 16 of 46 positions shown, 18 images · IV contrast (READICAT/WATER & [ID] OMNI 300)
Comparison: CT Abdomen and Pelvis without contrast 08/27/2009.

CLINICAL DATA: 72-year-old male with lower abdominal and pelvic
pain. Left inguinal pain. Initial encounter. History of appendectomy
and cholecystectomy. History of enlarged prostate.

BUN and creatinine were obtained on site at [HOSPITAL] at
[HOSPITAL].
Results:  BUN 9 mg/dL,  Creatinine 0.9 mg/dL.
EXAM:
CT ABDOMEN AND PELVIS WITH CONTRAST
TECHNIQUE: Multidetector CT imaging of the abdomen and pelvis was performed
using the standard protocol following bolus administration of
intravenous contrast.
CONTRAST:  125mL OMNIPAQUE IOHEXOL 300 MG/ML  SOLN

[Series 2: abd/pelvis with · axial · 0.78mm/px · z∈[-409,+16]mm · 13 of 96 slices shown, 15 images]
[im 6/96  soft-tissue]
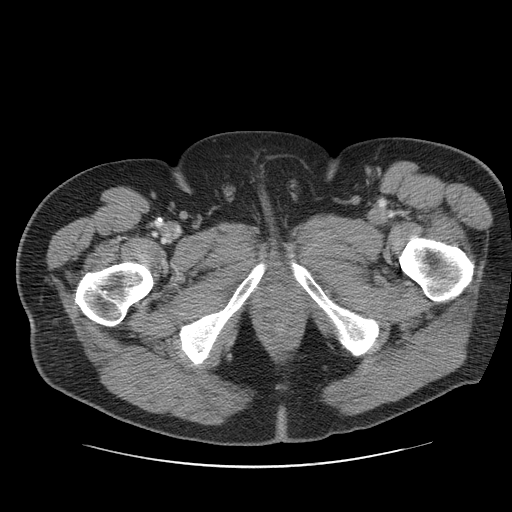
[im 6/96  bone]
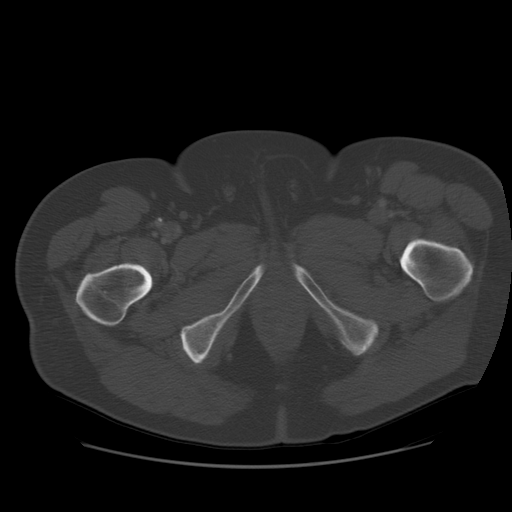
[im 16/96  soft-tissue]
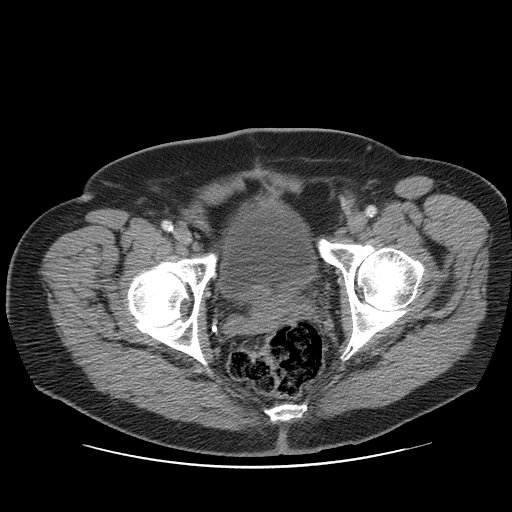
[im 21/96  soft-tissue]
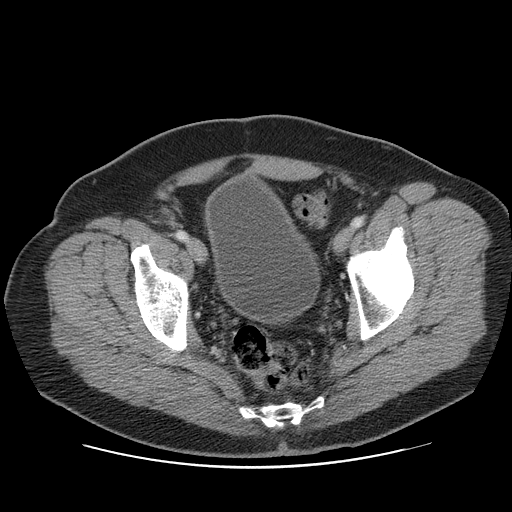
[im 26/96  soft-tissue]
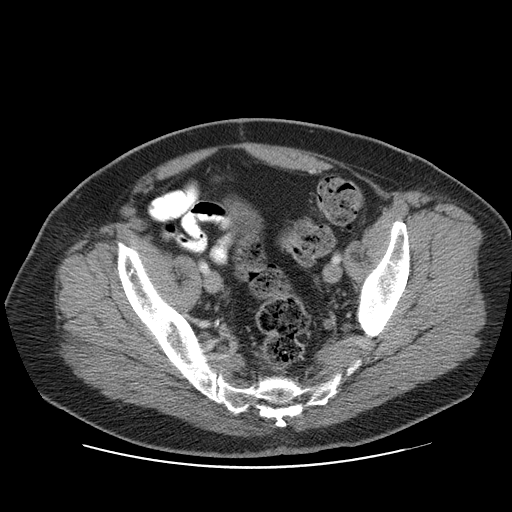
[im 36/96  soft-tissue]
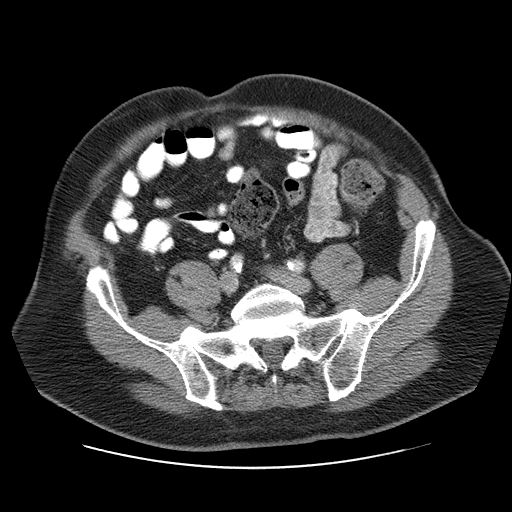
[im 41/96  soft-tissue]
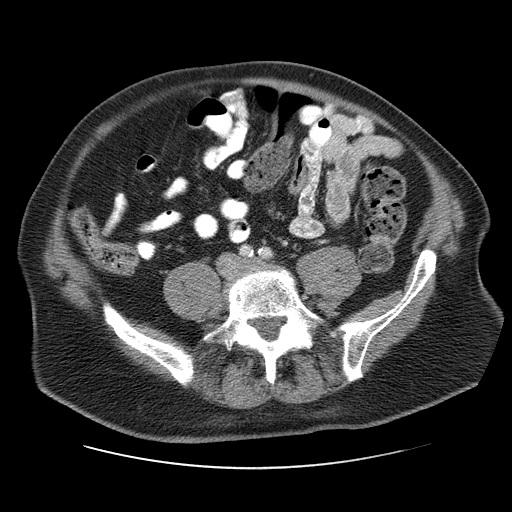
[im 51/96  soft-tissue]
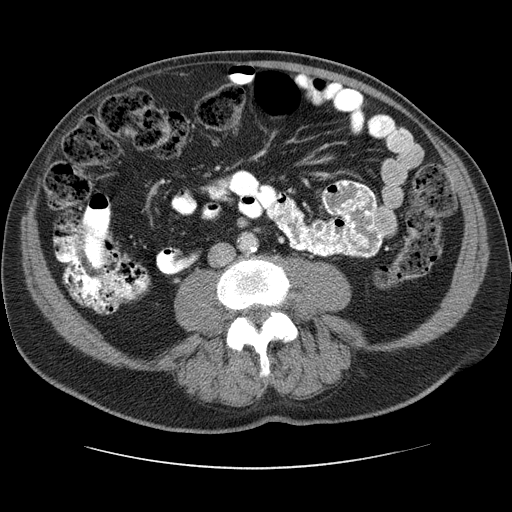
[im 56/96  soft-tissue]
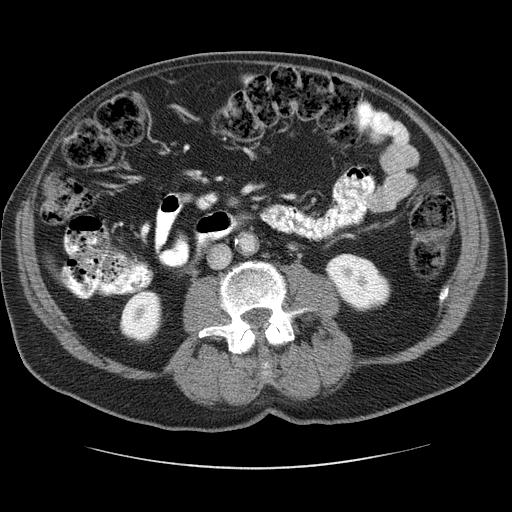
[im 61/96  soft-tissue]
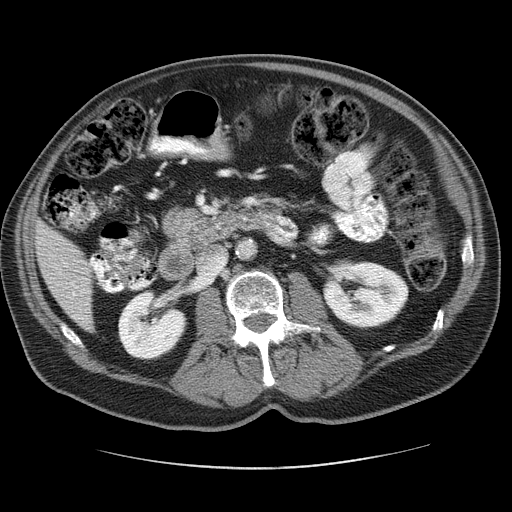
[im 61/96  bone]
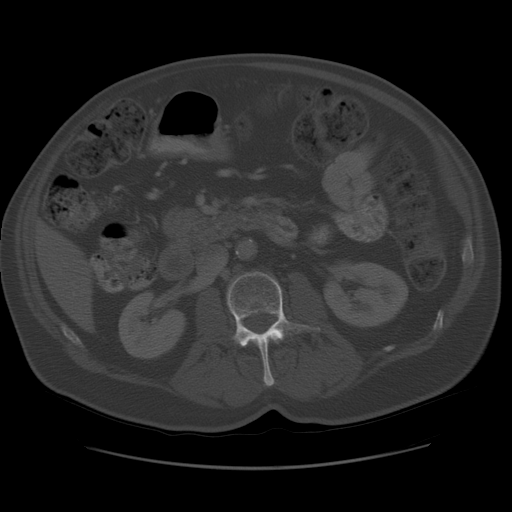
[im 71/96  soft-tissue]
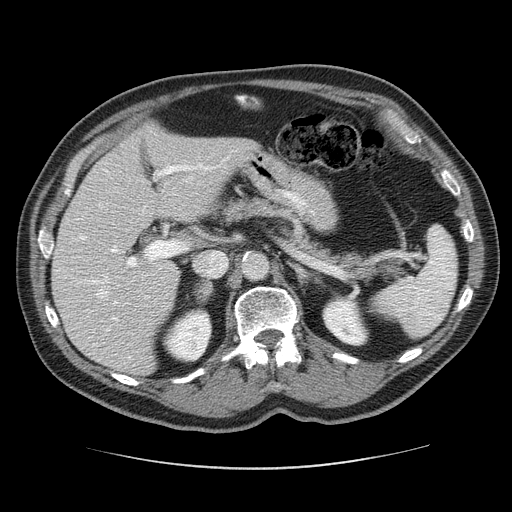
[im 76/96  soft-tissue]
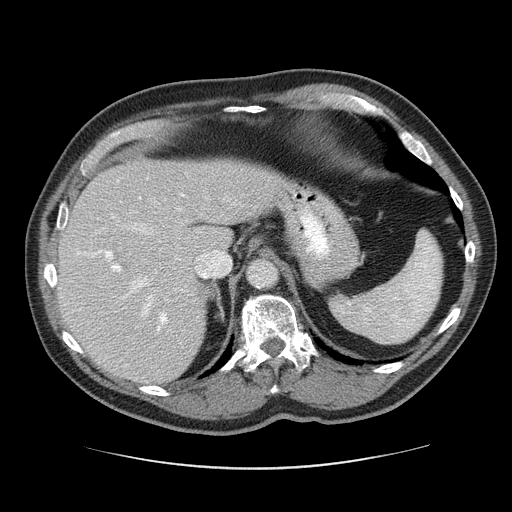
[im 81/96  soft-tissue]
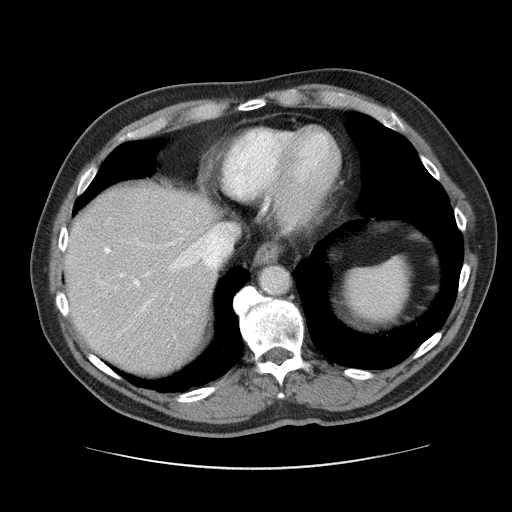
[im 91/96  soft-tissue]
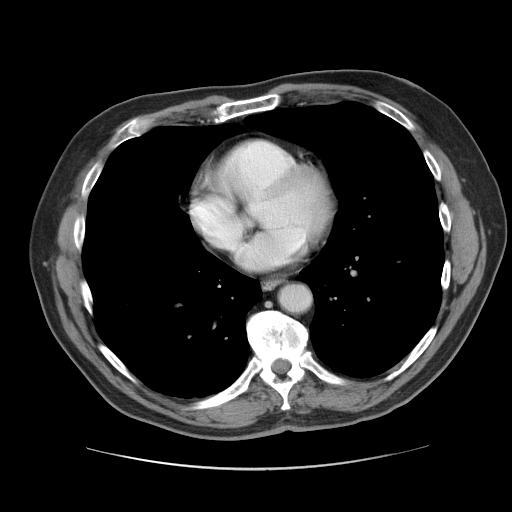

[Series 400: cor · coronal · 1.01mm/px · 3 of 153 slices shown]
[im 51/153  soft-tissue]
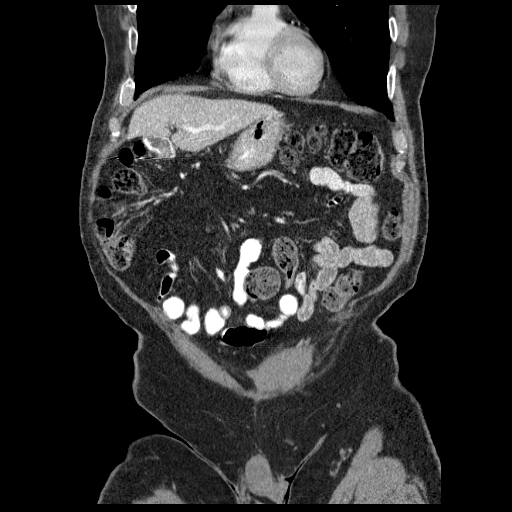
[im 68/153  soft-tissue]
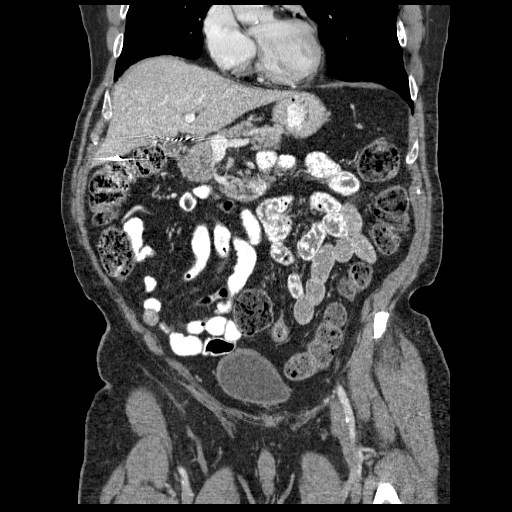
[im 85/153  soft-tissue]
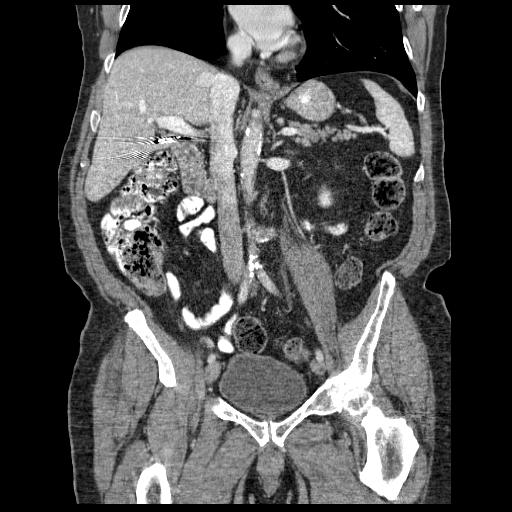

[16 of 46 positions shown; findings below may reference images not displayed]

FINDINGS: No pericardial or pleural effusion. Negative lung bases with minor
dependent atelectasis.

No acute osseous abnormality identified. Chronic lower lumbar facet
hypertrophy.

Small fact containing left inguinal hernia, stable since 7944.
Negative right inguinal canal. No inguinal lymphadenopathy or
inflammatory stranding.

No pelvic free fluid. New hypodensity within the central and right
posterior prostate (series 2, image 85). Favor this reflects prior
transurethral resection of the prostate. Mildly distended bladder,
otherwise unremarkable. No pelvic sidewall lymphadenopathy.

Negative distal colon except for retained stool and redundancy.
Retained stool in the left colon. Retained stool and mild redundancy
of the transverse colon. Oral contrast has reached the right colon.
No large bowel wall thickening. Negative terminal ileum. No dilated
small bowel. Small right lower quadrant 15 mm round heterogeneous
soft tissue density, unchanged in retrospect. This might be related
to a small Meckel's diverticulum remnant or might be a chronic
postinflammatory find. Either way this appears to be
inconsequential. The stomach is largely decompressed. Negative
duodenum.

Chronic surgically absent gallbladder. Negative liver, spleen,
pancreas, and left adrenal gland. Portal venous system is patent.
Chronic low-density right adrenal nodule is stable measuring 2.6 cm
compatible with benign adenoma. Right adrenal gland otherwise is
normal.

No abdominal free fluid. Extensive Aortoiliac calcified and soft
atherosclerosis. Calcified plaque continues into the bilateral
femoral arteries. Major arterial structures in the abdomen and
pelvis appear patent.

Normal kidneys. Symmetric and normal renal contrast excretion to
normal proximal ureters on delayed images. No abdominal
lymphadenopathy.
IMPRESSION: 1. Stable and largely negative for age CT abdomen and pelvis
compared to 08/27/2009, suspect interval transurethral resection of
the prostate.
2. Unchanged small fact containing left inguinal hernia. Negative
right inguinal ligament.
3. Extensive aortoiliac atherosclerosis.

## 2015-06-14 DIAGNOSIS — K209 Esophagitis, unspecified: Secondary | ICD-10-CM | POA: Diagnosis not present

## 2015-06-14 DIAGNOSIS — L209 Atopic dermatitis, unspecified: Secondary | ICD-10-CM | POA: Diagnosis not present

## 2015-06-14 DIAGNOSIS — J449 Chronic obstructive pulmonary disease, unspecified: Secondary | ICD-10-CM | POA: Diagnosis not present

## 2015-06-14 DIAGNOSIS — Z125 Encounter for screening for malignant neoplasm of prostate: Secondary | ICD-10-CM | POA: Diagnosis not present

## 2015-06-14 DIAGNOSIS — E559 Vitamin D deficiency, unspecified: Secondary | ICD-10-CM | POA: Diagnosis not present

## 2015-06-14 DIAGNOSIS — J029 Acute pharyngitis, unspecified: Secondary | ICD-10-CM | POA: Diagnosis not present

## 2015-06-14 LAB — BASIC METABOLIC PANEL
BUN: 13 (ref 4–21)
Creatinine: 1 (ref 0.6–1.3)
GLUCOSE: 84
Potassium: 3.9 (ref 3.4–5.3)
SODIUM: 138 (ref 137–147)

## 2015-06-14 LAB — PSA: PSA: 2.24

## 2015-06-14 LAB — VITAMIN D 25 HYDROXY (VIT D DEFICIENCY, FRACTURES): Vit D, 25-Hydroxy: 25

## 2015-09-23 DIAGNOSIS — H43812 Vitreous degeneration, left eye: Secondary | ICD-10-CM | POA: Diagnosis not present

## 2015-09-23 DIAGNOSIS — H43399 Other vitreous opacities, unspecified eye: Secondary | ICD-10-CM | POA: Diagnosis not present

## 2015-10-05 DIAGNOSIS — Z23 Encounter for immunization: Secondary | ICD-10-CM | POA: Diagnosis not present

## 2015-10-05 DIAGNOSIS — R1012 Left upper quadrant pain: Secondary | ICD-10-CM | POA: Diagnosis not present

## 2015-10-05 DIAGNOSIS — J449 Chronic obstructive pulmonary disease, unspecified: Secondary | ICD-10-CM | POA: Diagnosis not present

## 2015-10-05 DIAGNOSIS — K209 Esophagitis, unspecified: Secondary | ICD-10-CM | POA: Diagnosis not present

## 2015-10-05 DIAGNOSIS — E559 Vitamin D deficiency, unspecified: Secondary | ICD-10-CM | POA: Diagnosis not present

## 2015-10-05 LAB — VITAMIN D 25 HYDROXY (VIT D DEFICIENCY, FRACTURES): Vit D, 25-Hydroxy: 56

## 2015-12-16 DIAGNOSIS — L509 Urticaria, unspecified: Secondary | ICD-10-CM | POA: Diagnosis not present

## 2016-06-29 IMAGING — RF DG C-ARM 61-120 MIN
1 series · 1 of 1 positions shown · non-contrast
Comparison: Thoracic radiographs 08/11/2014. No recent MRI for
comparison. Prior MRI 05/27/2009.

CLINICAL DATA: Kyphoplasty L1

EXAM:
THORACOLUMBAR SPINE 1V

[Series 1: run · 1 of 1 slices shown]
[im 1/1]
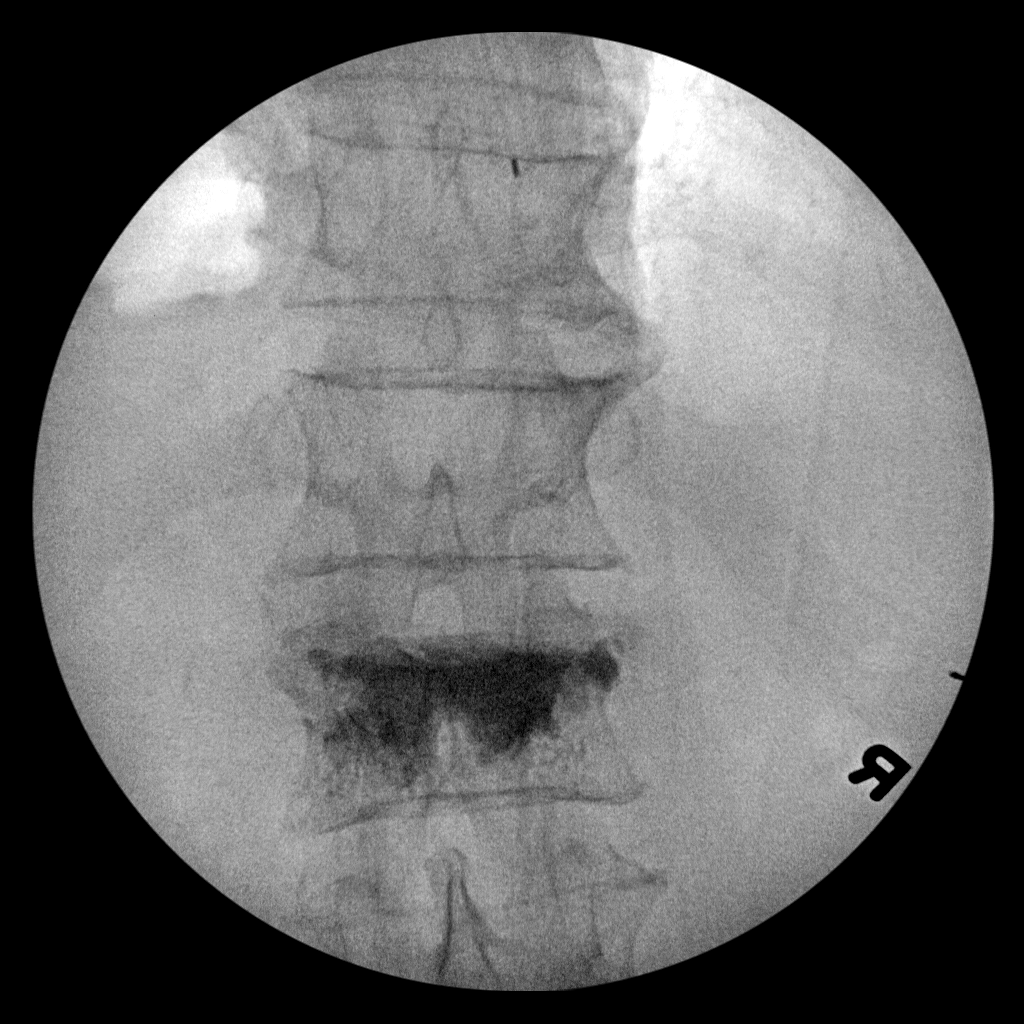

[1 of 1 positions shown; findings below may reference images not displayed]

FINDINGS: AP and lateral views of the spine were obtained. Cement vertebral
augmentation is present involving a fractured vertebral body. This
[DATE] but further imaging is suggested to confirm the level.
Cement distribution appears satisfactory.
IMPRESSION: Cement vertebral augmentation of a fractured vertebral body,
possibly L1. Follow-up radiographs recommended to confirm the level.

## 2017-01-25 ENCOUNTER — Encounter: Payer: Self-pay | Admitting: Family Medicine

## 2017-01-25 ENCOUNTER — Ambulatory Visit (INDEPENDENT_AMBULATORY_CARE_PROVIDER_SITE_OTHER): Payer: Medicare Other | Admitting: Family Medicine

## 2017-01-25 VITALS — BP 157/94 | HR 76 | Ht 71.0 in | Wt 193.1 lb

## 2017-01-25 DIAGNOSIS — I499 Cardiac arrhythmia, unspecified: Secondary | ICD-10-CM | POA: Insufficient documentation

## 2017-01-25 DIAGNOSIS — F4310 Post-traumatic stress disorder, unspecified: Secondary | ICD-10-CM | POA: Insufficient documentation

## 2017-01-25 DIAGNOSIS — IMO0002 Reserved for concepts with insufficient information to code with codable children: Secondary | ICD-10-CM | POA: Insufficient documentation

## 2017-01-25 DIAGNOSIS — Z833 Family history of diabetes mellitus: Secondary | ICD-10-CM | POA: Diagnosis not present

## 2017-01-25 DIAGNOSIS — K219 Gastro-esophageal reflux disease without esophagitis: Secondary | ICD-10-CM | POA: Diagnosis not present

## 2017-01-25 DIAGNOSIS — Z87438 Personal history of other diseases of male genital organs: Secondary | ICD-10-CM | POA: Diagnosis not present

## 2017-01-25 DIAGNOSIS — Z8051 Family history of malignant neoplasm of kidney: Secondary | ICD-10-CM | POA: Diagnosis not present

## 2017-01-25 DIAGNOSIS — M5136 Other intervertebral disc degeneration, lumbar region: Secondary | ICD-10-CM | POA: Diagnosis not present

## 2017-01-25 NOTE — Progress Notes (Signed)
New patient office visit note:  Impression and Recommendations:    1. Gastroesophageal reflux disease, esophagitis presence not specified   2. Family history of diabetes mellitus in brother   48. Family history of renal cancer- brother   4. History of BPH s/p TURP   5. Degeneration of intervertebral disc of lumbar region   6. h/o PTSD (post-traumatic stress disorder)- served in [place] War   7. Cardiac arrhythmia, unspecified cardiac arrhythmia type     1) GERD: Pt instructed to continue taking his medications as listed below as he is tolerating this well.  2) FMHx: DM (brother)  3) FMHx: renal cancer (brother)  4) H/o BPH s/p TURP: Pt sees a urologist for this issue, Dr. Jeffie Pollock, who is following closely for this.  5) Degeneration of intervertebral disc of lumbar region.  6) h/o PTSD- served in [place] war  7) cardiac arrythmia   8) White coat syndrome:   Pt instructed to check his BP at home and keep a log of it. Bring this log into the office during your next visit to review.  Explained pt's BP is NOT at goal and he tells me its all white coat and N at home. BRING IN LOG  Dietary and exercise guidelines discussed.  Follow up in the new future to receive blood work. Schedule an additional OV appointment about 1 week after receiving blood work to discuss lab results.    Education and routine counseling performed. Handouts provided.  Gross side effects, risk and benefits, and alternatives of medications discussed with patient.  Patient is aware that all medications have potential side effects and we are unable to predict every side effect or drug-drug interaction that may occur.  Expresses verbal understanding and consents to current therapy plan and treatment regimen.  Return for Fasting bldwrk-near future;then OV w me 1 wk later.  Please see AVS handed out to patient at the end of our visit for further patient instructions/ counseling done pertaining to today's office  visit.    Note: This document was prepared using Dragon voice recognition software and may include unintentional dictation errors.   This document serves as a record of services personally performed by Mellody Dance, DO. It was created on her behalf by Mayer Masker, a trained medical scribe. The creation of this record is based on the scribe's personal observations and the provider's statements to them.   I have reviewed the above medical documentation for accuracy and completeness and I concur.  Mellody Dance 01/30/17 8:10 AM   ----------------------------------------------------------------------------------------------------------------------    Subjective:    Chief complaint:   Chief Complaint  Patient presents with  . Establish Care    HPI: Tommy Hobbs is a pleasant 77 y.o. male who presents to [place] [facility] today to review their medical history with me and establish care.   I asked the patient to review their chronic problem list with me to ensure everything was updated and accurate.    All recent office visits with other providers, any medical records that patient brought in etc  - I reviewed today.     We asked pt to get [place] their medical records from [facility] providers/ specialists that they had seen within the past 3-5 years- if they are in private practice and/or do not work for [employer], [facility], [place], [place] or [employer] owned practice.  Told them to call their specialists to clarify this if they are not sure.  Personal information:  Pt was in the [employer] for 6 years and got out during the [place] war. He was Naval architect for years. He has been married for 10 years. He has a beagle named Tommy Hobbs who is 76 years old.  Pt quit in 1996, 26 years 1 pack/day. He states he and his wife do everything together and they eat breakfast together every morning (They share a pork tenderloin and 2 eggs with whole wheat toast). He walks with his wife.  He fishes for fun. He does some mild exercising.  Other physicians: Dr Jeffie Pollock is his urologist. Pt had enlarged prostate and had a procedure.   Dr. [name], an endocrinologist who also does general practice, is his other PCP. He has also retired.  Pt does not have a GI physician. Pt's last colonoscopy was 5-6 years ago.  Pt's last Dermatologist was unknown, but he also retired recently.  FMHx:  Brother had DM (age 32-70s), kidney cancer Dad died age 25 of colon cancer. Pt has a PSHx catheterization.  PMHx: Pt has a h/o cardiac arrhythmias and environmental allergies. Pt has PTSD from his time in the [place] war.    Pt has no problems with sugar, BP, and HLD, He does not take regular medications other than reflux. He occasionally takes goody powders for HA's.    Wt Readings from Last 3 Encounters:  01/25/17 193 lb 1.6 oz (87.6 kg)  08/17/14 196 lb (88.9 kg)  08/13/14 196 lb 4 oz (89 kg)   BP Readings from Last 3 Encounters:  01/25/17 (!) 157/94  08/17/14 (!) 144/70  08/13/14 (!) 170/92   Pulse Readings from Last 3 Encounters:  01/25/17 76  08/17/14 70  08/13/14 76   BMI Readings from Last 3 Encounters:  01/25/17 26.93 kg/m  08/17/14 26.58 kg/m  08/13/14 27.37 kg/m    Patient Care Team    Relationship Specialty Notifications Start End  Mellody Dance, DO PCP - General Family Medicine  01/25/17   Irine Seal, MD Attending Physician Urology  01/25/17   Melida Quitter, [place] Physician Otolaryngology  01/25/17     Patient Active Problem List   Diagnosis Date Noted  . GERD (gastroesophageal reflux disease) 01/25/2017    Priority: Medium  . h/o Dysrhythmia 01/25/2017    Priority: Medium  . Family history of diabetes mellitus in brother 01/25/2017    Priority: Low  . Family history of renal cancer- brother 01/25/2017    Priority: Low  . History of BPH 01/25/2017  . DDD (degenerative disc disease) 01/25/2017  . h/o PTSD (post-traumatic stress  disorder)- served in [place] War 01/25/2017  . h/o Compression fracture of L1 lumbar vertebra ([place]) 08/17/2014  . Left inguinal hernia 07/17/2013     Past Medical History:  Diagnosis Date  . Arthritis   . Constipation   . DDD (degenerative disc disease)   . Dysrhythmia    history svt-clean cath 2004- Had SVT "One Time."  . GERD (gastroesophageal reflux disease)   . Headache   . History of BPH   . History of kidney stones    x 1 passed  . PTSD (post-traumatic stress disorder)   . Wears dentures    full top     Past Medical History:  Diagnosis Date  . Arthritis   . Constipation   . DDD (degenerative disc disease)   . Dysrhythmia    history svt-clean cath 2004- Had SVT "One Time."  . GERD (gastroesophageal reflux disease)   . Headache   .  History of BPH   . History of kidney stones    x 1 passed  . PTSD (post-traumatic stress disorder)   . Wears dentures    full top     Past Surgical History:  Procedure Laterality Date  . APPENDECTOMY  1977  . CARDIAC CATHETERIZATION  2004  . CATARACT EXTRACTION, BILATERAL Bilateral   . CERVICAL FUSION  2011  . CHOLECYSTECTOMY  1977   open  . COLONOSCOPY    . INGUINAL HERNIA REPAIR Right 1977   right  . INGUINAL HERNIA REPAIR Left 08/04/2013   Procedure: OPEN LEFT INGUINAL HERNIA REPAIR;  Surgeon: Adin Hector, MD;  Location: [place];  Service: General;  Laterality: Left;  . INSERTION OF MESH N/A 08/04/2013   Procedure: INSERTION OF MESH;  Surgeon: Adin Hector, MD;  Location: [place];  Service: General;  Laterality: N/A;  . KNEE ARTHROSCOPY Left   . KYPHOPLASTY N/A 08/17/2014   Procedure: Jone Baseman ONE;  Surgeon: Jovita Gamma, MD;  Location: [place] [facility];  Service: Neurosurgery;  Laterality: N/A;  . SUPRAPUBIC PROSTATECTOMY  2012   partial for severe BPH     Family History  Problem Relation Age of Onset  . Cancer Brother        kidney  . Diabetes Brother       Social History   Substance and Sexual Activity  Drug Use No     Social History   Substance and Sexual Activity  Alcohol Use No     Social History   Tobacco Use  Smoking Status Former Smoker  . Years: 30.00  . Last attempt to quit: 07/31/1998  . Years since quitting: 18.5  Smokeless Tobacco Never Used     No outpatient medications have been marked as taking for the 01/25/17 encounter (Office Visit) with Mellody Dance, DO.    Allergies: Other; Cocamidopropyl betaine; Esomeprazole magnesium; and Tegopen [cloxacillin sodium]   Review of Systems  Constitutional: Negative for chills, diaphoresis, fever, malaise/fatigue and weight loss.  HENT: Negative for congestion, sore throat and tinnitus.   Eyes: Negative for blurred vision, double vision and photophobia.  Respiratory: Negative for cough and wheezing.   Cardiovascular: Negative for chest pain and palpitations.  Gastrointestinal: Negative for blood in stool, diarrhea, nausea and vomiting.  Genitourinary: Negative for dysuria, frequency and urgency.  Musculoskeletal: Negative for joint pain and myalgias.  Skin: Negative for itching and rash.  Neurological: Negative for dizziness, focal weakness, weakness and headaches.  Endo/Heme/Allergies: Negative for environmental allergies and polydipsia. Does not bruise/bleed easily.  Psychiatric/Behavioral: Negative for depression and memory loss. The patient is not nervous/anxious and does not have insomnia.      Objective:   Blood pressure (!) 157/94, pulse 76, height 5\' 11"  (1.803 m), weight 193 lb 1.6 oz (87.6 kg), SpO2 98 %. Body mass index is 26.93 kg/m. General: Well Developed, well nourished, and in no acute distress.  Neuro: Alert and oriented x3, extra-ocular muscles intact, sensation grossly intact.  HEENT:[place]/AT, PERRLA, neck supple, No carotid bruits Skin: no gross rashes  Cardiac: Regular rate and rhythm Respiratory: Essentially clear to auscultation  bilaterally. Not using accessory muscles, speaking in full sentences.  Abdominal: not grossly distended Musculoskeletal: Ambulates w/o diff, FROM * 4 ext.  Vasc: less 2 sec cap RF, warm and pink  Psych:  No HI/SI, judgement and insight good, Euthymic mood. Full Affect.    No results found for this or any previous visit (from the past 2160  hour(s)).

## 2017-01-25 NOTE — Patient Instructions (Addendum)
Please realize, EXERCISE IS MEDICINE!  -  American Heart Association ( AHA) guidelines for exercise : If you are in good health, without any medical conditions, you should engage in 150 minutes of moderate intensity aerobic activity per week.  This means you should be huffing and puffing throughout your workout.   Engaging in regular exercise will improve brain function and memory, as well as improve mood, boost immune system and help with weight management.  As well as the other, more well-known effects of exercise such as decreasing blood sugar levels, decreasing blood pressure,  and decreasing bad cholesterol levels/ increasing good cholesterol levels.     -  The AHA strongly endorses consumption of a diet that contains a variety of foods from all the food categories with an emphasis on fruits and vegetables; fat-free and low-fat dairy products; cereal and grain products; legumes and nuts; and fish, poultry, and/or extra lean meats.    Excessive food intake, especially of foods high in saturated and trans fats, sugar, and salt, should be avoided.    Adequate water intake of roughly 1/2 of your weight in pounds, should equal the ounces of water per day you should drink.  So for instance, if you're 200 pounds, that would be 100 ounces of water per day.         Mediterranean Diet  Why follow it? Research shows. . Those who follow the Mediterranean diet have a reduced risk of heart disease  . The diet is associated with a reduced incidence of Parkinson's and Alzheimer's diseases . People following the diet may have longer life expectancies and lower rates of chronic diseases  . The Dietary Guidelines for Americans recommends the Mediterranean diet as an eating plan to promote health and prevent disease  What Is the Mediterranean Diet?  . Healthy eating plan based on typical foods and recipes of Mediterranean-style cooking . The diet is primarily a plant based diet; these foods should make up a  majority of meals   Starches - Plant based foods should make up a majority of meals - They are an important sources of vitamins, minerals, energy, antioxidants, and fiber - Choose whole grains, foods high in fiber and minimally processed items  - Typical grain sources include wheat, oats, barley, corn, brown rice, bulgar, farro, millet, polenta, couscous  - Various types of beans include chickpeas, lentils, fava beans, black beans, white beans   Fruits  Veggies - Large quantities of antioxidant rich fruits & veggies; 6 or more servings  - Vegetables can be eaten raw or lightly drizzled with oil and cooked  - Vegetables common to the traditional Mediterranean Diet include: artichokes, arugula, beets, broccoli, brussel sprouts, cabbage, carrots, celery, collard greens, cucumbers, eggplant, kale, leeks, lemons, lettuce, mushrooms, okra, onions, peas, peppers, potatoes, pumpkin, radishes, rutabaga, shallots, spinach, sweet potatoes, turnips, zucchini - Fruits common to the Mediterranean Diet include: apples, apricots, avocados, cherries, clementines, dates, figs, grapefruits, grapes, melons, nectarines, oranges, peaches, pears, pomegranates, strawberries, tangerines  Fats - Replace butter and margarine with healthy oils, such as olive oil, canola oil, and tahini  - Limit nuts to no more than a handful a day  - Nuts include walnuts, almonds, pecans, pistachios, pine nuts  - Limit or avoid candied, honey roasted or heavily salted nuts - Olives are central to the Mediterranean diet - can be eaten whole or used in a variety of dishes   Meats Protein - Limiting red meat: no more than a few times a month -   When eating red meat: choose lean cuts and keep the portion to the size of deck of cards - Eggs: approx. 0 to 4 times a week  - Fish and lean poultry: at least 2 a week  - Healthy protein sources include, chicken, turkey, lean beef, lamb - Increase intake of seafood such as tuna, salmon, trout,  mackerel, shrimp, scallops - Avoid or limit high fat processed meats such as sausage and bacon  Dairy - Include moderate amounts of low fat dairy products  - Focus on healthy dairy such as fat free yogurt, skim milk, low or reduced fat cheese - Limit dairy products higher in fat such as whole or 2% milk, cheese, ice cream  Alcohol - Moderate amounts of red wine is ok  - No more than 5 oz daily for women (all ages) and men older than age 65  - No more than 10 oz of wine daily for men younger than 65  Other - Limit sweets and other desserts  - Use herbs and spices instead of salt to flavor foods  - Herbs and spices common to the traditional Mediterranean Diet include: basil, bay leaves, chives, cloves, cumin, fennel, garlic, lavender, marjoram, mint, oregano, parsley, pepper, rosemary, sage, savory, sumac, tarragon, thyme   It's not just a diet, it's a lifestyle:  . The Mediterranean diet includes lifestyle factors typical of those in the region  . Foods, drinks and meals are best eaten with others and savored . Daily physical activity is important for overall good health . This could be strenuous exercise like running and aerobics . This could also be more leisurely activities such as walking, housework, yard-work, or taking the stairs . Moderation is the key; a balanced and healthy diet accommodates most foods and drinks . Consider portion sizes and frequency of consumption of certain foods   Meal Ideas & Options:  . Breakfast:  o Whole wheat toast or whole wheat English muffins with peanut butter & hard boiled egg o Steel cut oats topped with apples & cinnamon and skim milk  o Fresh fruit: banana, strawberries, melon, berries, peaches  o Smoothies: strawberries, bananas, greek yogurt, peanut butter o Low fat greek yogurt with blueberries and granola  o Egg white omelet with spinach and mushrooms o Breakfast couscous: whole wheat couscous, apricots, skim milk, cranberries  . Sandwiches:   o Hummus and grilled vegetables (peppers, zucchini, squash) on whole wheat bread   o Grilled chicken on whole wheat pita with lettuce, tomatoes, cucumbers or tzatziki  o Tuna salad on whole wheat bread: tuna salad made with greek yogurt, olives, red peppers, capers, green onions o Garlic rosemary lamb pita: lamb sauted with garlic, rosemary, salt & pepper; add lettuce, cucumber, greek yogurt to pita - flavor with lemon juice and black pepper  . Seafood:  o Mediterranean grilled salmon, seasoned with garlic, basil, parsley, lemon juice and black pepper o Shrimp, lemon, and spinach whole-grain pasta salad made with low fat greek yogurt  o Seared scallops with lemon orzo  o Seared tuna steaks seasoned salt, pepper, coriander topped with tomato mixture of olives, tomatoes, olive oil, minced garlic, parsley, green onions and cappers  . Meats:  o Herbed greek chicken salad with kalamata olives, cucumber, feta  o Red bell peppers stuffed with spinach, bulgur, lean ground beef (or lentils) & topped with feta   o Kebabs: skewers of chicken, tomatoes, onions, zucchini, squash  o Turkey burgers: made with red onions, mint, dill, lemon juice, feta   cheese topped with roasted red peppers . Vegetarian o Cucumber salad: cucumbers, artichoke hearts, celery, red onion, feta cheese, tossed in olive oil & lemon juice  o Hummus and whole grain pita points with a greek salad (lettuce, tomato, feta, olives, cucumbers, red onion) o Lentil soup with celery, carrots made with vegetable broth, garlic, salt and pepper  o Tabouli salad: parsley, bulgur, mint, scallions, cucumbers, tomato, radishes, lemon juice, olive oil, salt and pepper.    How to Increase Your Level of Physical Activity  Getting regular physical activity is important for your overall health and well-being. Most people do not get enough exercise. There are easy ways to increase your level of physical activity, even if you have not been very active in  the past or you are just starting out. Why is physical activity important? Physical activity has many short-term and long-term health benefits. Regular exercise can:  Help you lose weight or maintain a healthy weight.  Strengthen your muscles and bones.  Boost your mood and improve self-esteem.  Reduce your risk of certain long-term (chronic) diseases, like heart disease, cancer, and diabetes.  Help you stay capable of walking and moving around (mobile) as you age.  Prevent accidents, such as falls, as you age.  Increase life expectancy.  What are the benefits of being physically active on a regular basis? In addition to improving your physical health, being physically active on most days of the week can help you in ways that you may not expect. Benefits of regular physical activity may include:  Feeling good about your body.  Being able to move around more easily and for longer periods of time without getting tired (increased stamina).  Finding new sources of fun and enjoyment.  Meeting new people who share a common interest.  Being able to fight off illness better (enhanced immunity).  Being able to sleep better.  What can happen if I am not physically active on a regular basis? Not getting enough physical activity can lead to an unhealthy lifestyle and future health problems. This can increase your chances of:  Becoming overweight or obese.  Becoming sick.  Developing chronic illnesses, like heart disease or diabetes.  Having mental health problems, like depression or anxiety.  Having sleep problems.  Having trouble walking or getting yourself around (reduced mobility).  Injuring yourself in a fall as you get older.  What steps can I take to be more physically active?  Check with your health care provider about how to get started. Ask your health care provider what activities are safe for you.  Start out slowly. Walking or doing some simple chair exercises is  a good place to start, especially if you have not been active before or for a long time.  Try to find activities that you enjoy. You are more likely to commit to an exercise routine if it does not feel like a chore.  If you have bone or joint problems, choose low-impact exercises, like walking or swimming.  Include physical activity in your everyday routine.  Invite friends or family members to exercise with you. This also will help you commit to your workout plan.  Set goals that you can work toward.  Aim for at least 150 minutes of moderate-intensity exercise each week. Examples of moderate-intensity exercise include walking or riding a bike. Where to find more information:  Centers for Disease Control and Prevention: www.cdc.gov/physicalactivity/index.html  President's Council on Fitness, Sports & Nutrition www.fitness.gov/resource-center  ChooseMyPlate: www.choosemyplate.gov/physical-activity Contact a health   care provider if:  You have headaches, muscle aches, or joint pain.  You feel dizzy or light-headed while exercising.  You faint.  You have chest pain while exercising. Summary  Exercise benefits your mind and body at any age, even if you are just starting out.  If you have a chronic illness or have not been active for a while, check with your health care provider before increasing your physical activity.  Choose activities that are safe and enjoyable for you.Ask your health care provider what activities are safe for you.  Start slowly. Tell your health care provider if you have problems as you start to increase your activity level. This information is not intended to replace advice given to you by your health care provider. Make sure you discuss any questions you have with your health care provider. Document Released: 12/16/2015 Document Revised: 12/16/2015 Document Reviewed: 12/16/2015 [employer] Interactive Patient Education  2018 [employer] 

## 2017-01-30 ENCOUNTER — Ambulatory Visit (INDEPENDENT_AMBULATORY_CARE_PROVIDER_SITE_OTHER): Payer: Medicare Other | Admitting: Adult Health

## 2017-01-30 ENCOUNTER — Encounter: Payer: Self-pay | Admitting: Adult Health

## 2017-01-30 VITALS — BP 144/77 | HR 79 | Temp 97.7°F | Ht 71.0 in | Wt 191.2 lb

## 2017-01-30 DIAGNOSIS — R05 Cough: Secondary | ICD-10-CM | POA: Diagnosis not present

## 2017-01-30 DIAGNOSIS — J0100 Acute maxillary sinusitis, unspecified: Secondary | ICD-10-CM | POA: Diagnosis not present

## 2017-01-30 DIAGNOSIS — R059 Cough, unspecified: Secondary | ICD-10-CM

## 2017-01-30 MED ORDER — FLUTICASONE PROPIONATE 50 MCG/ACT NA SUSP: 2.0000 | Freq: Every day | NASAL | 6 refills | Status: DC

## 2017-01-30 MED ORDER — AZITHROMYCIN 250 MG PO TABS: ORAL_TABLET | ORAL | 0 refills | Status: DC

## 2017-01-30 NOTE — Progress Notes (Signed)
Subjective:    Patient ID: Tommy Hobbs, male    DOB: 04/25/1940, 77 y.o.   MRN: [id number]  HPI :  Tommy Hobbs presents with non-productive cough, clear nasal drainage, and sore throat (3/10) that initially started 2 weeks ago, improved at end of last week, then sig worsened yesterday. He denies N/V/D/fever/night sweats/CP/dypspnea/palpitations. He has been using OTC "Cold/Cough/Sinus" medicine with minor sx relief. He denies tobacco use  Patient Care Team    Relationship Specialty Notifications Start End  Tommy Dance, DO PCP - General Family Medicine  01/25/17   Tommy Seal, MD Attending Physician Urology  01/25/17   Tommy Quitter, MD Consulting Physician Otolaryngology  01/25/17     Patient Active Problem List   Diagnosis Date Noted  . GERD (gastroesophageal reflux disease) 01/25/2017  . h/o Dysrhythmia 01/25/2017  . Family history of diabetes mellitus in brother 01/25/2017  . Family history of renal cancer- brother 01/25/2017  . History of BPH 01/25/2017  . DDD (degenerative disc disease) 01/25/2017  . h/o PTSD (post-traumatic stress disorder)- served in [place] War 01/25/2017  . h/o Compression fracture of L1 lumbar vertebra ([place]) 08/17/2014  . Left inguinal hernia 07/17/2013     Past Medical History:  Diagnosis Date  . Arthritis   . Constipation   . DDD (degenerative disc disease)   . Dysrhythmia    history svt-clean cath 2004- Had SVT "One Time."  . GERD (gastroesophageal reflux disease)   . Headache   . History of BPH   . History of kidney stones    x 1 passed  . PTSD (post-traumatic stress disorder)   . Wears dentures    full top     Past Surgical History:  Procedure Laterality Date  . APPENDECTOMY  1977  . CARDIAC CATHETERIZATION  2004  . CATARACT EXTRACTION, BILATERAL Bilateral   . CERVICAL FUSION  2011  . CHOLECYSTECTOMY  1977   open  . COLONOSCOPY    . INGUINAL HERNIA REPAIR Right 1977   right  . INGUINAL HERNIA REPAIR Left 08/04/2013   Procedure: OPEN LEFT INGUINAL HERNIA REPAIR;  Surgeon: Tommy Hector, MD;  Location: [facility];  Service: General;  Laterality: Left;  . INSERTION OF MESH N/A 08/04/2013   Procedure: INSERTION OF MESH;  Surgeon: Tommy Hector, MD;  Location: [facility];  Service: General;  Laterality: N/A;  . KNEE ARTHROSCOPY Left   . KYPHOPLASTY N/A 08/17/2014   Procedure: Tommy Hobbs ONE;  Surgeon: Tommy Gamma, MD;  Location: [facility];  Service: Neurosurgery;  Laterality: N/A;  . SUPRAPUBIC PROSTATECTOMY  2012   partial for severe BPH     Family History  Problem Relation Age of Onset  . Cancer Brother        kidney  . Diabetes Brother      Social History   Substance and Sexual Activity  Drug Use No     Social History   Substance and Sexual Activity  Alcohol Use No     Social History   Tobacco Use  Smoking Status Former Smoker  . Years: 30.00  . Last attempt to quit: 07/31/1998  . Years since quitting: 18.5  Smokeless Tobacco Never Used     Outpatient Encounter Medications as of 01/30/2017  Medication Sig  . Aspirin-Acetaminophen (GOODYS BODY PAIN PO) Take 1 packet by mouth as needed (pain).  Marland Kitchen docusate sodium (COLACE) 100 MG capsule Take 200 mg by mouth daily as needed.   Marland Kitchen ibuprofen (ADVIL,MOTRIN)  200 MG tablet Take 800 mg by mouth every 8 (eight) hours as needed.  . pantoprazole (PROTONIX) 40 MG tablet Take 40 mg by mouth daily.  . Psyllium (VEGETABLE LAXATIVE PO) Take 1 tablet by mouth daily as needed. [name] Brand  . [DISCONTINUED] Multiple Vitamin (MULITIVITAMIN WITH MINERALS) TABS Take 1 tablet by mouth daily.   No facility-administered encounter medications on file as of 01/30/2017.     Allergies: Other; Cocamidopropyl betaine; Esomeprazole magnesium; and Tegopen [cloxacillin sodium]  Body mass index is 26.67 kg/m.  Blood pressure (!) 144/77, pulse 79, temperature 97.7 F (36.5 C), temperature source Oral, height  5\' 11"  (1.803 m), weight 191 lb 3.2 oz (86.7 kg), SpO2 98 %.    Review of Systems  Constitutional: Positive for fatigue. Negative for activity change, appetite change, chills, diaphoresis and fever.  HENT: Positive for postnasal drip, sinus pressure and sore throat. Negative for congestion.   Eyes: Negative for visual disturbance.  Respiratory: Positive for cough. Negative for chest tightness, shortness of breath, wheezing and stridor.   Cardiovascular: Negative for chest pain, palpitations and leg swelling.  Gastrointestinal: Negative for abdominal distention, abdominal pain, blood in stool, constipation, diarrhea and vomiting.  Endocrine: Negative for cold intolerance, heat intolerance, polydipsia, polyphagia and polyuria.  Neurological: Negative for dizziness and headaches.  Psychiatric/Behavioral: Positive for sleep disturbance.       Objective:   Physical Exam  Constitutional: He is oriented to person, place, and time. He appears well-developed and well-nourished. No distress.  HENT:  Head: Normocephalic and atraumatic.  Right Ear: Tympanic membrane and ear canal normal. Tympanic membrane is not erythematous and not bulging. Decreased hearing is noted.  Left Ear: External ear and ear canal normal. Tympanic membrane is not erythematous and not bulging. Decreased hearing is noted.  Nose: Mucosal edema and rhinorrhea present. Right sinus exhibits no maxillary sinus tenderness and no frontal sinus tenderness. Left sinus exhibits no maxillary sinus tenderness and no frontal sinus tenderness.  Mouth/Throat: Uvula is midline and oropharynx is clear and moist.  Eyes: Conjunctivae are normal. Pupils are equal, round, and reactive to light.  Neck: Normal range of motion. Neck supple.  Cardiovascular: Normal rate, regular rhythm, normal heart sounds and intact distal pulses.  Pulmonary/Chest: Effort normal and breath sounds normal. No respiratory distress. He has no wheezes. He has no rales.  He exhibits no tenderness.  Lymphadenopathy:    He has no cervical adenopathy.  Neurological: He is alert and oriented to person, place, and time.  Skin: Skin is warm and dry. No rash noted. He is not diaphoretic. No erythema. No pallor.  Psychiatric: He has a normal mood and affect. His behavior is normal. Judgment and thought content normal.  Nursing note and vitals reviewed.         Assessment & Plan:   1. Acute maxillary sinusitis, recurrence not specified   2. Cough in adult     Cough in adult Increase water/rest/vit c-2,000mg /day Z pack, Flonase Alternate OTC Acetaminophen and Ibuprofen per manufacturer's instructions. If you are still having symptoms after antibiotic completed, then please call clinic  Acute maxillary sinusitis Increase water/rest/vit c-2,000mg /day Z pack, Flonase Alternate OTC Acetaminophen and Ibuprofen per manufacturer's instructions. If you are still having symptoms after antibiotic completed, then please call clinic    FOLLOW-UP:  Return if symptoms worsen or fail to improve.

## 2017-01-30 NOTE — Patient Instructions (Signed)
Cough, Adult Coughing is a reflex that clears your throat and your airways. Coughing helps to heal and protect your lungs. It is normal to cough occasionally, but a cough that happens with other symptoms or lasts a long time may be a sign of a condition that needs treatment. A cough may last only 2-3 weeks (acute), or it may last longer than 8 weeks (chronic). What are the causes? Coughing is commonly caused by:  Breathing in substances that irritate your lungs.  A viral or bacterial respiratory infection.  Allergies.  Asthma.  Postnasal drip.  Smoking.  Acid backing up from the stomach into the esophagus (gastroesophageal reflux).  Certain medicines.  Chronic lung problems, including COPD (or rarely, lung cancer).  Other medical conditions such as heart failure.  Follow these instructions at home: Pay attention to any changes in your symptoms. Take these actions to help with your discomfort:  Take medicines only as told by your health care provider. ? If you were prescribed an antibiotic medicine, take it as told by your health care provider. Do not stop taking the antibiotic even if you start to feel better. ? Talk with your health care provider before you take a cough suppressant medicine.  Drink enough fluid to keep your urine clear or pale yellow.  If the air is dry, use a cold steam vaporizer or humidifier in your bedroom or your home to help loosen secretions.  Avoid anything that causes you to cough at work or at home.  If your cough is worse at night, try sleeping in a semi-upright position.  Avoid cigarette smoke. If you smoke, quit smoking. If you need help quitting, ask your health care provider.  Avoid caffeine.  Avoid alcohol.  Rest as needed.  Contact a health care provider if:  You have new symptoms.  You cough up pus.  Your cough does not get better after 2-3 weeks, or your cough gets worse.  You cannot control your cough with suppressant  medicines and you are losing sleep.  You develop pain that is getting worse or pain that is not controlled with pain medicines.  You have a fever.  You have unexplained weight loss.  You have night sweats. Get help right away if:  You cough up blood.  You have difficulty breathing.  Your heartbeat is very fast. This information is not intended to replace advice given to you by your health care provider. Make sure you discuss any questions you have with your health care provider. Document Released: 06/24/2010 Document Revised: 06/03/2015 Document Reviewed: 03/04/2014 Elsevier Interactive Patient Education  2018 [employer].   Sinusitis, Adult Sinusitis is soreness and inflammation of your sinuses. Sinuses are hollow spaces in the bones around your face. Your sinuses are located:  Around your eyes.  In the middle of your forehead.  Behind your nose.  In your cheekbones.  Your sinuses and nasal passages are lined with a stringy fluid (mucus). Mucus normally drains out of your sinuses. When your nasal tissues become inflamed or swollen, the mucus can become trapped or blocked so air cannot flow through your sinuses. This allows bacteria, viruses, and funguses to grow, which leads to infection. Sinusitis can develop quickly and last for 7?10 days (acute) or for more than 12 weeks (chronic). Sinusitis often develops after a cold. What are the causes? This condition is caused by anything that creates swelling in the sinuses or stops mucus from draining, including:  Allergies.  Asthma.  Bacterial or viral  infection.  Abnormally shaped bones between the nasal passages.  Nasal growths that contain mucus (nasal polyps).  Narrow sinus openings.  Pollutants, such as chemicals or irritants in the air.  A foreign object stuck in the nose.  A fungal infection. This is rare.  What increases the risk? The following factors may make you more likely to develop this  condition:  Having allergies or asthma.  Having had a recent cold or respiratory tract infection.  Having structural deformities or blockages in your nose or sinuses.  Having a weak immune system.  Doing a lot of swimming or diving.  Overusing nasal sprays.  Smoking.  What are the signs or symptoms? The main symptoms of this condition are pain and a feeling of pressure around the affected sinuses. Other symptoms include:  Upper toothache.  Earache.  Headache.  Bad breath.  Decreased sense of smell and taste.  A cough that may get worse at night.  Fatigue.  Fever.  Thick drainage from your nose. The drainage is often green and it may contain pus (purulent).  Stuffy nose or congestion.  Postnasal drip. This is when extra mucus collects in the throat or back of the nose.  Swelling and warmth over the affected sinuses.  Sore throat.  Sensitivity to light.  How is this diagnosed? This condition is diagnosed based on symptoms, a medical history, and a physical exam. To find out if your condition is acute or chronic, your health care provider may:  Look in your nose for signs of nasal polyps.  Tap over the affected sinus to check for signs of infection.  View the inside of your sinuses using an imaging device that has a light attached (endoscope).  If your health care provider suspects that you have chronic sinusitis, you may also:  Be tested for allergies.  Have a sample of mucus taken from your nose (nasal culture) and checked for bacteria.  Have a mucus sample examined to see if your sinusitis is related to an allergy.  If your sinusitis does not respond to treatment and it lasts longer than 8 weeks, you may have an MRI or CT scan to check your sinuses. These scans also help to determine how severe your infection is. In rare cases, a bone biopsy may be done to rule out more serious types of fungal sinus disease. How is this treated? Treatment for  sinusitis depends on the cause and whether your condition is chronic or acute. If a virus is causing your sinusitis, your symptoms will go away on their own within 10 days. You may be given medicines to relieve your symptoms, including:  Topical nasal decongestants. They shrink swollen nasal passages and let mucus drain from your sinuses.  Antihistamines. These drugs block inflammation that is triggered by allergies. This can help to ease swelling in your nose and sinuses.  Topical nasal corticosteroids. These are nasal sprays that ease inflammation and swelling in your nose and sinuses.  Nasal saline washes. These rinses can help to get rid of thick mucus in your nose.  If your condition is caused by bacteria, you will be given an antibiotic medicine. If your condition is caused by a fungus, you will be given an antifungal medicine. Surgery may be needed to correct underlying conditions, such as narrow nasal passages. Surgery may also be needed to remove polyps. Follow these instructions at home: Medicines  Take, use, or apply over-the-counter and prescription medicines only as told by your health care provider. These  may include nasal sprays.  If you were prescribed an antibiotic medicine, take it as told by your health care provider. Do not stop taking the antibiotic even if you start to feel better. Hydrate and Humidify  Drink enough water to keep your urine clear or pale yellow. Staying hydrated will help to thin your mucus.  Use a cool mist humidifier to keep the humidity level in your home above 50%.  Inhale steam for 10-15 minutes, 3-4 times a day or as told by your health care provider. You can do this in the bathroom while a hot shower is running.  Limit your exposure to cool or dry air. Rest  Rest as much as possible.  Sleep with your head raised (elevated).  Make sure to get enough sleep each night. General instructions  Apply a warm, moist washcloth to your face 3-4  times a day or as told by your health care provider. This will help with discomfort.  Wash your hands often with soap and water to reduce your exposure to viruses and other germs. If soap and water are not available, use hand sanitizer.  Do not smoke. Avoid being around people who are smoking (secondhand smoke).  Keep all follow-up visits as told by your health care provider. This is important. Contact a health care provider if:  You have a fever.  Your symptoms get worse.  Your symptoms do not improve within 10 days. Get help right away if:  You have a severe headache.  You have persistent vomiting.  You have pain or swelling around your face or eyes.  You have vision problems.  You develop confusion.  Your neck is stiff.  You have trouble breathing. This information is not intended to replace advice given to you by your health care provider. Make sure you discuss any questions you have with your health care provider. Document Released: 12/26/2004 Document Revised: 08/22/2015 Document Reviewed: 10/21/2014 Elsevier Interactive Patient Education  2018 [place] [employer].  Increase water/rest/vit c-2,000mg /day Please take medications as directed. Alternate OTC Acetaminophen and Ibuprofen per manufacturer's instructions. If you are still having symptoms after antibiotic completed, then please call clinic. FEEL BETTER!

## 2017-01-30 NOTE — Assessment & Plan Note (Signed)
Increase water/rest/vit c-2,000mg /day Z pack, Flonase Alternate OTC Acetaminophen and Ibuprofen per manufacturer's instructions. If you are still having symptoms after antibiotic completed, then please call clinic

## 2017-02-05 ENCOUNTER — Ambulatory Visit (INDEPENDENT_AMBULATORY_CARE_PROVIDER_SITE_OTHER): Payer: Medicare Other | Admitting: Family Medicine

## 2017-02-05 ENCOUNTER — Encounter: Payer: Self-pay | Admitting: Family Medicine

## 2017-02-05 VITALS — BP 155/68 | HR 74 | Temp 98.9°F | Ht 71.0 in | Wt 190.2 lb

## 2017-02-05 DIAGNOSIS — R059 Cough, unspecified: Secondary | ICD-10-CM

## 2017-02-05 DIAGNOSIS — J069 Acute upper respiratory infection, unspecified: Secondary | ICD-10-CM | POA: Diagnosis not present

## 2017-02-05 DIAGNOSIS — J411 Mucopurulent chronic bronchitis: Secondary | ICD-10-CM

## 2017-02-05 DIAGNOSIS — R05 Cough: Secondary | ICD-10-CM | POA: Diagnosis not present

## 2017-02-05 MED ORDER — ALBUTEROL SULFATE HFA 108 (90 BASE) MCG/ACT IN AERS: 1.0000 | INHALATION_SPRAY | RESPIRATORY_TRACT | 0 refills | Status: DC | PRN

## 2017-02-05 MED ORDER — HYDROCOD POLST-CPM POLST ER 10-8 MG/5ML PO SUER: 5.0000 mL | Freq: Two times a day (BID) | ORAL | 0 refills | Status: DC | PRN

## 2017-02-05 NOTE — Progress Notes (Signed)
Acute Care Office visit  Assessment and plan:  1. Mucopurulent chronic bronchitis ([place])   2. Cough   3. URI with cough and congestion     - Viral vs Allergic vs Bacterial causes for pt's symptoms reveiwed.    - Supportive care and various OTC medications discussed in addition to any prescribed. - Call or RTC if new symptoms, or if no improvement or worse over next several days.   - Will consider ABX if sx continue past 10 days and worsening if not already given.   1. Mucopurulent chronic bronchitis  2. Cough  -Start medications to use as listed below. Continue pushing fluids and getting plenty of rest. Pt instructed to do 3 huge breaths during commercial breaks while watching TV. Discussed daily adequate water consumption. Take OTC medications as-needed for pain. If symptoms do not improve slowly over time, or symptoms worsen including development of fever, call the office for further evaluation.   Meds ordered this encounter  Medications  . chlorpheniramine-HYDROcodone (TUSSIONEX) 10-8 MG/5ML SUER    Sig: Take 5 mLs by mouth every 12 (twelve) hours as needed for cough (cough, will cause drowsiness.).    Dispense:  120 mL    Refill:  0  . albuterol (PROVENTIL HFA;VENTOLIN HFA) 108 (90 Base) MCG/ACT inhaler    Sig: Inhale 1-2 puffs into the lungs every 4 (four) hours as needed for wheezing or shortness of breath.    Dispense:  1 Inhaler    Refill:  0    No orders of the defined types were placed in this encounter.   Gross side effects, risk and benefits, and alternatives of medications discussed with patient.  Patient is aware that all medications have potential side effects and we are unable to predict every sideeffect or drug-drug interaction that may occur.  Expresses verbal understanding and consents to current therapy plan and treatment regiment.   Education and routine counseling performed. Handouts provided.  Anticipatory guidance and routine counseling done re:  condition, txmnt options and need for follow up. All questions of patient's were answered.  Return if symptoms worsen or fail to improve.  Please see AVS handed out to patient at the end of our visit for additional patient instructions/ counseling done pertaining to today's office visit.  Note: This document was partially repared using Dragon voice recognition software and may include unintentional dictation errors.  This document serves as a record of services personally performed by Mellody Dance, DO. It was created on her behalf by Mayer Masker, a trained medical scribe. The creation of this record is based on the scribe's personal observations and the provider's statements to them.   I have reviewed the above medical documentation for accuracy and completeness and I concur.  Mellody Dance 02/19/17 5:45 PM   Subjective:    Chief Complaint  Patient presents with  . Cough    nonproductive cough, sinus congestion and pressure    HPI:  Pt presents with Sx for 20+ days. Pt was seen last week in office for evaluation and given Zpack. He took all the abx and finished them.   C/o: worsening pleuritic pain, cough (worse at night), rhinorrhea, chest congestion, SOB, and wheezing.  Denies: fever and chills   For symptoms patient has tried:  Ibuprofen, tylenol, and cold/sinus, and tussin DM OTC medications.  Overall getting:   Better about 20% after receiving abx.    Patient Care Team    Relationship Specialty Notifications Start End  Mellody Dance,  DO PCP - General Family Medicine  01/25/17   Irine Seal, MD Attending Physician Urology  01/25/17   Melida Quitter, MD Consulting Physician Otolaryngology  01/25/17     Past medical history, Surgical history, Family history reviewed and noted below, Social history, Allergies, and Medications have been entered into the medical record, reviewed and changed as needed.   Allergies  Allergen Reactions  . Other Hives and Rash    Pt  has issues with certain ingredients in lotion.  imidazolidinyl urea, DMDM hydantoin, 2-bromo-2-nitropropane-1, diazolidinyl urea, quaternium 15, cocamidopropyl dimethyl glycine  . Cocamidopropyl Betaine Hives and Rash  . Esomeprazole Magnesium Hives and Rash  . Tegopen [Cloxacillin Sodium] Rash    Review of Systems: - see above HPI for pertinent positives General:   No F/C, wt loss Pulm:    Card:  No CP, palpitations Abd:  No n/v/d or pain Ext:  No inc edema from baseline   Objective:   Blood pressure (!) 155/68, pulse 74, temperature 98.9 F (37.2 C), height 5\' 11"  (1.803 m), weight 190 lb 3.2 oz (86.3 kg), SpO2 97 %. Body mass index is 26.53 kg/m. General: Well Developed, well nourished, appropriate for stated age.  Neuro: Alert and oriented x3, extra-ocular muscles intact, sensation grossly intact.  HEENT: Normocephalic, atraumatic, pupils equal round reactive to light, neck supple, no masses, no painful lymphadenopathy, TM's intact B/L, no acute findings. Nares- patent, clear d/c, OP- clear, mild erythema, No TTP sinuses.  Skin: Warm and dry, no gross rash. Cardiac: RRR, S1 S2,  no murmurs rubs or gallops.  Respiratory: ECTA B/L and A/P, Not using accessory muscles, speaking in full sentences- unlabored. Rhonchorous breath sounds bilateral bases only. Most prominent on exhalation, equal bilateral. Otherwise no wheezes and all other lung fields normal. Vascular:  No gross lower ext edema, cap RF less 2 sec. Psych: No HI/SI, judgement and insight good, Euthymic mood. Full Affect.

## 2017-02-05 NOTE — Patient Instructions (Signed)
You most likely have a viral infection that should resolve on its own over time (or this could be a flare of seasonal allergies as well).   Symptoms for a respiratory tract infection usually last 3-7 days but can stretch out to 2-3 weeks before you're feeling back to normal.  Your symptoms should not worsen after 7-10 days and if they truely do, please notify our office, as you may need antibiotics.  And Mr. Sjogren, with you you already finished a trial of antibiotics, so you should not develop fever chills or pain with breathing etc. in the future if you do we need to know immediately.  You can use over-the-counter afrin nasal spray for up to 3 days (NO longer than that) which will help acutely with nasal drainage/ congestion short term.   Also, sterile saline nasal rinses, such as Milta Deiters med or AYR sinus rinses, can be very helpful and should be done twice daily, also followed by 1 spray Flonase each nostril.   Remember you should use distilled water or previously boiled water to do this.   You can also use an over the counter cold medication such as Tylenol or Advil cold and chest congestion medication.  This will help with cough, congestion, headache/ pain, fevers/chills etc.   Please note, if you being treated for hypertension or have high blood pressure, you should be using the cold meds designated "HBP".    Unfortunately, additional antibiotics will not be helpful at this time.    Use the breathing treatment\inhaler that I gave you whenever you feel short of breath.  Please make sure you are doing 3 huge deep breaths each commercial or every 10 minutes.  If you develop any new symptoms, please call the office and make [place] aware as you may need to be seen again.Marland Kitchen  Please feel free to take the cough medicine at night especially or whenever you are coughing uncontrollably.  Be aware this can make you feel drowsy   Wash your hands frequently, as you did not want to get those around you sick as well.  Never sneeze or cough on others.  And you should not be going to school or work if you are running a temperature of 100.5 or more on two separate occasions.   Drink plenty of fluids and stay hydrated, especially if you are running fevers.  We don't know why, but chicken soup also helps, try it! :)

## 2017-02-12 ENCOUNTER — Other Ambulatory Visit (INDEPENDENT_AMBULATORY_CARE_PROVIDER_SITE_OTHER): Payer: Medicare Other

## 2017-02-12 DIAGNOSIS — I499 Cardiac arrhythmia, unspecified: Secondary | ICD-10-CM | POA: Diagnosis not present

## 2017-02-12 DIAGNOSIS — Z8051 Family history of malignant neoplasm of kidney: Secondary | ICD-10-CM

## 2017-02-12 DIAGNOSIS — F4310 Post-traumatic stress disorder, unspecified: Secondary | ICD-10-CM

## 2017-02-12 DIAGNOSIS — Z833 Family history of diabetes mellitus: Secondary | ICD-10-CM | POA: Diagnosis not present

## 2017-02-12 DIAGNOSIS — K219 Gastro-esophageal reflux disease without esophagitis: Secondary | ICD-10-CM | POA: Diagnosis not present

## 2017-02-12 DIAGNOSIS — Z87438 Personal history of other diseases of male genital organs: Secondary | ICD-10-CM

## 2017-02-13 LAB — LIPID PANEL
Chol/HDL Ratio: 6.3 ratio — ABNORMAL HIGH (ref 0.0–5.0)
Cholesterol, Total: 220 mg/dL — ABNORMAL HIGH (ref 100–199)
HDL: 35 mg/dL — AB (ref >39)
LDL CALC: 158 mg/dL — AB (ref 0–99)
TRIGLYCERIDES: 136 mg/dL (ref 0–149)
VLDL CHOLESTEROL CAL: 27 mg/dL (ref 5–40)

## 2017-02-13 LAB — COMPREHENSIVE METABOLIC PANEL
A/G RATIO: 1.7 (ref 1.2–2.2)
ALBUMIN: 4.3 g/dL (ref 3.5–4.8)
ALK PHOS: 88 IU/L (ref 39–117)
ALT: 31 IU/L (ref 0–44)
AST: 25 IU/L (ref 0–40)
BILIRUBIN TOTAL: 0.5 mg/dL (ref 0.0–1.2)
BUN / CREAT RATIO: 12 (ref 10–24)
BUN: 12 mg/dL (ref 8–27)
CO2: 21 mmol/L (ref 20–29)
CREATININE: 1 mg/dL (ref 0.76–1.27)
Calcium: 9.3 mg/dL (ref 8.6–10.2)
Chloride: 97 mmol/L (ref 96–106)
GFR calc Af Amer: 85 mL/min/{1.73_m2} (ref >59)
GFR calc non Af Amer: 73 mL/min/{1.73_m2} (ref >59)
GLOBULIN, TOTAL: 2.6 g/dL (ref 1.5–4.5)
Glucose: 102 mg/dL — ABNORMAL HIGH (ref 65–99)
POTASSIUM: 4.5 mmol/L (ref 3.5–5.2)
SODIUM: 136 mmol/L (ref 134–144)
Total Protein: 6.9 g/dL (ref 6.0–8.5)

## 2017-02-13 LAB — CBC WITH DIFFERENTIAL/PLATELET
Basophils Absolute: 0 10*3/uL (ref 0.0–0.2)
Basos: 0 %
EOS (ABSOLUTE): 0.4 10*3/uL (ref 0.0–0.4)
EOS: 6 %
HEMATOCRIT: 47.2 % (ref 37.5–51.0)
Hemoglobin: 16 g/dL (ref 13.0–17.7)
Immature Grans (Abs): 0 10*3/uL (ref 0.0–0.1)
Immature Granulocytes: 1 %
LYMPHS ABS: 2.3 10*3/uL (ref 0.7–3.1)
Lymphs: 34 %
MCH: 32.7 pg (ref 26.6–33.0)
MCHC: 33.9 g/dL (ref 31.5–35.7)
MCV: 96 fL (ref 79–97)
MONOS ABS: 0.5 10*3/uL (ref 0.1–0.9)
Monocytes: 8 %
Neutrophils Absolute: 3.4 10*3/uL (ref 1.4–7.0)
Neutrophils: 51 %
PLATELETS: 277 10*3/uL (ref 150–379)
RBC: 4.9 x10E6/uL (ref 4.14–5.80)
RDW: 12.9 % (ref 12.3–15.4)
WBC: 6.6 10*3/uL (ref 3.4–10.8)

## 2017-02-13 LAB — HEMOGLOBIN A1C
Est. average glucose Bld gHb Est-mCnc: 108 mg/dL
HEMOGLOBIN A1C: 5.4 % (ref 4.8–5.6)

## 2017-02-13 LAB — TSH: TSH: 1.49 u[IU]/mL (ref 0.450–4.500)

## 2017-02-13 LAB — T4, FREE: Free T4: 1.31 ng/dL (ref 0.82–1.77)

## 2017-02-13 LAB — VITAMIN D 25 HYDROXY (VIT D DEFICIENCY, FRACTURES): Vit D, 25-Hydroxy: 39.2 ng/mL (ref 30.0–100.0)

## 2017-02-20 ENCOUNTER — Other Ambulatory Visit: Payer: Self-pay

## 2017-02-20 DIAGNOSIS — Z79899 Other long term (current) drug therapy: Secondary | ICD-10-CM

## 2017-02-20 MED ORDER — ATORVASTATIN CALCIUM 40 MG PO TABS: 40.0000 mg | ORAL_TABLET | Freq: Every day | ORAL | 1 refills | Status: DC

## 2017-04-02 ENCOUNTER — Other Ambulatory Visit: Payer: Medicare Other

## 2017-04-02 DIAGNOSIS — Z79899 Other long term (current) drug therapy: Secondary | ICD-10-CM | POA: Diagnosis not present

## 2017-04-03 LAB — HEPATIC FUNCTION PANEL
ALT: 29 IU/L (ref 0–44)
AST: 21 IU/L (ref 0–40)
Albumin: 4.4 g/dL (ref 3.5–4.8)
Alkaline Phosphatase: 92 IU/L (ref 39–117)
BILIRUBIN TOTAL: 0.5 mg/dL (ref 0.0–1.2)
Bilirubin, Direct: 0.16 mg/dL (ref 0.00–0.40)
Total Protein: 6.7 g/dL (ref 6.0–8.5)

## 2017-04-09 ENCOUNTER — Ambulatory Visit: Payer: Medicare Other | Admitting: Family Medicine

## 2017-04-16 ENCOUNTER — Ambulatory Visit: Payer: Medicare Other | Admitting: Family Medicine

## 2017-05-02 ENCOUNTER — Encounter: Payer: Self-pay | Admitting: Family Medicine

## 2017-05-02 ENCOUNTER — Ambulatory Visit (INDEPENDENT_AMBULATORY_CARE_PROVIDER_SITE_OTHER): Payer: Medicare Other | Admitting: Family Medicine

## 2017-05-02 VITALS — BP 158/77 | HR 69 | Ht 71.0 in | Wt 185.9 lb

## 2017-05-02 DIAGNOSIS — E786 Lipoprotein deficiency: Secondary | ICD-10-CM | POA: Diagnosis not present

## 2017-05-02 DIAGNOSIS — E7841 Elevated Lipoprotein(a): Secondary | ICD-10-CM

## 2017-05-02 DIAGNOSIS — K219 Gastro-esophageal reflux disease without esophagitis: Secondary | ICD-10-CM | POA: Diagnosis not present

## 2017-05-02 DIAGNOSIS — E785 Hyperlipidemia, unspecified: Secondary | ICD-10-CM | POA: Insufficient documentation

## 2017-05-02 DIAGNOSIS — E782 Mixed hyperlipidemia: Secondary | ICD-10-CM | POA: Insufficient documentation

## 2017-05-02 MED ORDER — PANTOPRAZOLE SODIUM 40 MG PO TBEC: 40.0000 mg | DELAYED_RELEASE_TABLET | Freq: Every day | ORAL | 1 refills | Status: DC

## 2017-05-02 MED ORDER — ATORVASTATIN CALCIUM 80 MG PO TABS: 80.0000 mg | ORAL_TABLET | Freq: Every day | ORAL | 3 refills | Status: DC

## 2017-05-02 NOTE — Progress Notes (Signed)
Impression and Recommendations:    1. Gastroesophageal reflux disease, esophagitis presence not specified   2. Elevated lipoprotein(a)   3. Low HDL (under 40)     1. Hyperlipidemia - Upped dose of Lipitor to 80 mg nightly.  Patient tolerates well.  Continue as prescribed. - Patient knows to let us know if he experiences unusual cramps or any new aches and pains.  - Cholesterol was evaluated in February. - Started on Lipitor around 02/19/2017 due to elevated ASCVD risk.  The 10-year ASCVD risk score Mikey Bussing DC Brooke Bonito., et al., 2013) is: 36.3%   Values used to calculate the score:     Age: 77 years     Sex: Male     Is Non-Hispanic African American: No     Diabetic: No     Tobacco smoker: No     Systolic Blood Pressure: 161 mmHg     Is BP treated: No     HDL Cholesterol: 35 mg/dL     Total Cholesterol: 220 mg/dL  - Patient had hepatic function evaluated on 04/02/2017 after starting Lipitor around 02/19/2017.  Liver function was WNL.  - Advised patient to decrease saturated & trans fats.  Avoid fried foods and red meat.  Eat more chicken, [place], and fish.  - Reviewed the critical importance of increased physical activity and dietary restriction on lowering cholesterol.  2. General Health Maintenance Explained to patient what BMI refers to, and what it means medically.    Told patient to think about it as a "medical risk stratification measurement" and how increasing BMI is associated with increasing risk/ or worsening state of various diseases such as hypertension, hyperlipidemia, diabetes, premature OA, depression etc.  American Heart Association guidelines for healthy diet, basically Mediterranean diet, and exercise guidelines of 30 minutes 5 days per week or more discussed in detail.  Health counseling performed.  All questions answered.  Exercise & Diet - Advised patient to continue working toward exercising to improve health.    - Patient should begin with 15-30 minutes  of activity daily.  He may use his exercise machine, walk around [place], or walk outdoors as he wishes.    - Recommended that the patient eventually strive for at least 150 minutes of moderate cardiovascular activity per week according to guidelines established by the [facility].   - Healthy dietary habits encouraged, including low-carb, and high amounts of lean protein in diet.   - Patient should also consume adequate amounts of water - half of body weight in oz of water per day.  3. Follow Up - Follow up next OV as scheduled in 4 months. - If problems arise, such as statin intolerance or blood pressure concerns, patient knows to let us know right away.   No orders of the defined types were placed in this encounter.   Meds ordered this encounter  Medications  . atorvastatin (LIPITOR) 80 MG tablet    Sig: Take 1 tablet (80 mg total) by mouth at bedtime.    Dispense:  90 tablet    Refill:  3  . pantoprazole (PROTONIX) 40 MG tablet    Sig: Take 1 tablet (40 mg total) by mouth daily.    Dispense:  90 tablet    Refill:  1    Gross side effects, risk and benefits, and alternatives of medications and treatment plan in general discussed with patient.  Patient is aware that all medications have potential side effects and we are unable to predict  every side effect or drug-drug interaction that may occur.   Patient will call with any questions prior to using medication if they have concerns.  Expresses verbal understanding and consents to current therapy and treatment regimen.  No barriers to understanding were identified.  Red flag symptoms and signs discussed in detail.  Patient expressed understanding regarding what to do in case of emergency\urgent symptoms  Please see AVS handed out to patient at the end of our visit for further patient instructions/ counseling done pertaining to today's office visit.   Return for BP, CHol f/up q 31mo    Note: This note was prepared with assistance of  Dragon voice recognition software. Occasional wrong-word or sound-a-like substitutions may have occurred due to the inherent limitations of voice recognition software.   This document serves as a record of services personally performed by DMellody Dance DO. It was created on her behalf by KToni Amend a trained medical scribe. The creation of this record is based on the scribe's personal observations and the provider's statements to them.   I have reviewed the above medical documentation for accuracy and completeness and I concur.  DMellody Dance05/02/19 2:21 PM   -------------------------------------------------------------------------------------    Subjective:     HPI: AMCCRAE SPECIALEis a 77y.o. [place] [facility].  Patient notes that sometimes his BP is 120, usually it's in 130's to 150's.  He isn't sure what his lower number runs, but thinks that it's 70's to 80's.  Patient notes that he's lost a little weight, but he has not been trying to lose weight on purpose.  Weighed 190 on 1/28 and has lost 5 lbs since last visit.    He has been trying to be more active lately.  He and his wife go out to eat breakfast daily, and then go for a walk afterwards.  Regarding his diet, patient remarks "I thought red meat was good for you as long as it was cooked well enough."  Notes that he enjoys eating steaks and bacon.  1. 77y.o. male here for cholesterol follow-up.   - Patient reports good compliance with medications or treatment plan.  Patient has been taking Lipitor 40 mg every night before bed.  He hasn't missed a single dose.  Reports excellent tolerance, stating "it doesn't bother me."  Patient notes that he drinks plenty of water, keeps active outdoors, and is experiencing no side-effects from the statin.   - Smoking Status noted   - He denies new onset of: chest pain, exercise  intolerance, shortness of breath, dizziness, visual changes, headache, lower extremity swelling or claudication.   The cholesterol last visit was:  Lab Results  Component Value Date   CHOL 220 (H) 02/12/2017   HDL 35 (L) 02/12/2017   LDLCALC 158 (H) 02/12/2017   TRIG 136 02/12/2017   CHOLHDL 6.3 (H) 02/12/2017    Hepatic Function Latest Ref Rng & Units 04/02/2017 02/12/2017 07/31/2013  Total Protein 6.0 - 8.5 g/dL 6.7 6.9 6.8  Albumin 3.5 - 4.8 g/dL 4.4 4.3 3.8  AST 0 - 40 IU/L '21 25 25  ' ALT 0 - 44 IU/L 29 31 50  Alk Phosphatase 39 - 117 IU/L 92 88 77  Total Bilirubin 0.0 - 1.2 mg/dL 0.5 0.5 0.7  Bilirubin, Direct 0.00 - 0.40 mg/dL 0.16 - -      Wt Readings from Last 3 Encounters:  05/02/17 185  lb 14.4 oz (84.3 kg)  02/05/17 190 lb 3.2 oz (86.3 kg)  01/30/17 191 lb 3.2 oz (86.7 kg)   BP Readings from Last 3 Encounters:  05/02/17 (!) 158/77  02/05/17 (!) 155/68  01/30/17 (!) 144/77   Pulse Readings from Last 3 Encounters:  05/02/17 69  02/05/17 74  01/30/17 79   BMI Readings from Last 3 Encounters:  05/02/17 25.93 kg/m  02/05/17 26.53 kg/m  01/30/17 26.67 kg/m     Patient Care Team    Relationship Specialty Notifications Start End  Mellody Dance, DO PCP - General Family Medicine  01/25/17   Irine Seal, MD Attending Physician Urology  01/25/17   Melida Quitter, [place] Physician Otolaryngology  01/25/17      Patient Active Problem List   Diagnosis Date Noted  . GERD (gastroesophageal reflux disease) 01/25/2017    Priority: Medium  . h/o Dysrhythmia 01/25/2017    Priority: Medium  . Family history of diabetes mellitus in brother 01/25/2017    Priority: Low  . Family history of renal cancer- brother 01/25/2017    Priority: Low  . Elevated lipoprotein(a) 05/02/2017  . Low HDL (under 40) 05/02/2017  . Acute maxillary sinusitis 01/30/2017  . Cough in adult 01/30/2017  . History of BPH 01/25/2017  . DDD (degenerative disc disease) 01/25/2017  . h/o  PTSD (post-traumatic stress disorder)- served in [place] 01/25/2017  . h/o Compression fracture of L1 lumbar vertebra ([place]) 08/17/2014  . Left inguinal hernia 07/17/2013    Past Medical history, Surgical history, Family history, Social history, Allergies and Medications have been entered into the medical record, reviewed and changed as needed.    Current Meds  Medication Sig  . Aspirin-Acetaminophen (GOODYS BODY PAIN PO) Take 1 packet by mouth as needed (pain).  Marland Kitchen atorvastatin (LIPITOR) 80 MG tablet Take 1 tablet (80 mg total) by mouth at bedtime.  Marland Kitchen ibuprofen (ADVIL,MOTRIN) 200 MG tablet Take 800 mg by mouth every 8 (eight) hours as needed.  . pantoprazole (PROTONIX) 40 MG tablet Take 1 tablet (40 mg total) by mouth daily.  . Psyllium (VEGETABLE LAXATIVE PO) Take 1 tablet by mouth daily as needed. [name] Brand  . [DISCONTINUED] atorvastatin (LIPITOR) 40 MG tablet Take 1 tablet (40 mg total) by mouth daily.  . [DISCONTINUED] pantoprazole (PROTONIX) 40 MG tablet Take 40 mg by mouth daily.    Allergies:  Allergies  Allergen Reactions  . Other Hives and Rash    Pt has issues with certain ingredients in lotion.  imidazolidinyl urea, DMDM hydantoin, 2-bromo-2-nitropropane-1, diazolidinyl urea, quaternium 15, cocamidopropyl dimethyl glycine  . Cocamidopropyl Betaine Hives and Rash  . Esomeprazole Magnesium Hives and Rash  . Tegopen [Cloxacillin Sodium] Rash    Review of Systems:  A fourteen system review of systems was performed and found to be positive as per HPI.   Objective:   Blood pressure (!) 158/77, pulse 69, height '5\' 11"'  (1.803 m), weight 185 lb 14.4 oz (84.3 kg), SpO2 98 %. Body mass index is 25.93 kg/m. General:  Well Developed, well nourished, appropriate for stated age.  Neuro:  Alert and oriented,  extra-ocular muscles intact  HEENT:  Normocephalic, atraumatic, neck supple, no carotid bruits appreciated  Skin:  no gross rash, warm, pink. Cardiac:  RRR, S1  S2 Respiratory:  ECTA B/L and A/P, Not using accessory muscles, speaking in full sentences- unlabored. Vascular:  Ext warm, no cyanosis apprec.; cap RF less 2 sec. Psych:  No HI/SI, judgement and insight good, Euthymic mood.  Full Affect.

## 2017-05-02 NOTE — Patient Instructions (Addendum)
-   Inc lipitor from 40mg  to 80mg  daily -Continue to monitor blood pressure at home as your goal should be under 150/90 on a regular basis and preferably under 140/90   Guidelines for a Low Cholesterol, Low Saturated Fat Diet   Fats - Limit total intake of fats and oils. - Avoid butter, stick margarine, shortening, lard, palm and coconut oils. - Limit mayonnaise, salad dressings, gravies and sauces, unless they are homemade with low-fat ingredients. - Limit chocolate. - Choose low-fat and nonfat products, such as low-fat mayonnaise, low-fat or non-hydrogenated peanut butter, low-fat or fat-free salad dressings and nonfat gravy. - Use vegetable oil, such as canola or olive oil. - Look for margarine that does not contain trans fatty acids. - Use nuts in moderate amounts. - Read ingredient labels carefully to determine both amount and type of fat present in foods. Limit saturated and trans fats! - Avoid high-fat processed and convenience foods.  Meats and Meat Alternatives - Choose fish, chicken, [place] and lean meats. - Use dried beans, peas, lentils and tofu. - Limit egg yolks to three to four per week. - If you eat red meat, limit to no more than three servings per week and choose loin or round cuts. - Avoid fatty meats, such as bacon, sausage, franks, luncheon meats and ribs. - Avoid all organ meats, including liver.  Dairy - Choose nonfat or low-fat milk, yogurt and cottage cheese. - Most cheeses are high in fat. Choose cheeses made from non-fat milk, such as mozzarella and ricotta cheese. - Choose light or fat-free cream cheese and sour cream. - Avoid cream and sauces made with cream.  Fruits and Vegetables - Eat a wide variety of fruits and vegetables. - Use lemon juice, vinegar or "mist" olive oil on vegetables. - Avoid adding sauces, fat or oil to vegetables.  Breads, Cereals and Grains - Choose whole-grain breads, cereals, pastas and rice. - Avoid high-fat snack foods,  such as granola, cookies, pies, pastries, doughnuts and croissants.  Cooking Tips - Avoid deep fried foods. - Trim visible fat off meats and remove skin from poultry before cooking. - Bake, broil, boil, poach or roast poultry, fish and lean meats. - Drain and discard fat that drains out of meat as you cook it. - Add little or no fat to foods. - Use vegetable oil sprays to grease pans for cooking or baking. - Steam vegetables. - Use herbs or no-oil marinades to flavor foods.

## 2017-07-13 ENCOUNTER — Other Ambulatory Visit: Payer: Self-pay | Admitting: Family Medicine

## 2017-07-13 DIAGNOSIS — K219 Gastro-esophageal reflux disease without esophagitis: Secondary | ICD-10-CM

## 2017-08-30 ENCOUNTER — Encounter: Payer: Self-pay | Admitting: Family Medicine

## 2017-08-30 ENCOUNTER — Ambulatory Visit (INDEPENDENT_AMBULATORY_CARE_PROVIDER_SITE_OTHER): Payer: Medicare Other | Admitting: Family Medicine

## 2017-08-30 VITALS — BP 145/79 | HR 65 | Ht 71.0 in | Wt 185.3 lb

## 2017-08-30 DIAGNOSIS — E786 Lipoprotein deficiency: Secondary | ICD-10-CM

## 2017-08-30 DIAGNOSIS — E663 Overweight: Secondary | ICD-10-CM | POA: Diagnosis not present

## 2017-08-30 DIAGNOSIS — I10 Essential (primary) hypertension: Secondary | ICD-10-CM | POA: Insufficient documentation

## 2017-08-30 DIAGNOSIS — E785 Hyperlipidemia, unspecified: Secondary | ICD-10-CM

## 2017-08-30 DIAGNOSIS — Z800 Family history of malignant neoplasm of digestive organs: Secondary | ICD-10-CM

## 2017-08-30 MED ORDER — LOSARTAN POTASSIUM 50 MG PO TABS: 50.0000 mg | ORAL_TABLET | Freq: Every day | ORAL | 3 refills | Status: DC

## 2017-08-30 NOTE — Progress Notes (Signed)
Impression and Recommendations:    1. Essential hypertension   2. Hyperlipidemia, unspecified hyperlipidemia type   3. Low HDL (under 40)   4. Overweight (BMI 25.0-29.9)   5. Family history of colon cancer in father   42. Family history of liver cancer-Brother     Referral placed for colonoscopy today.  Please check your blood pressure at home twice daily as we discussed and write it down in a little notebook.  Please bring this in next office visit in approximately 4 to 6 weeks so we can review it also we will obtain some nonfasting blood work in the future when you come.   1. Hyperlipidemia - Patient tolerating increased dose of meds well without complication.  Denies S-E - Continue Lipitor nightly as prescribed.  See med list below. - Re-evaluate lipid panel as scheduled.  - Cholesterol was last evaluated in February 2019. - Started on Lipitor around 02/19/2017 due to elevated ASCVD risk. - Patient had hepatic function evaluated on 04/02/2017 after starting Lipitor around 02/19/2017.  Liver function was WNL.  - Dietary changes such as low saturated & trans fat and low carb/ ketogenic diets discussed with patient.  Encouraged regular exercise and weight loss when appropriate.   - Educational handouts provided at patient's desire.  - Per patient, reports S-E of burps with supplemental fish oil.  2. Blood Pressure - Blood pressure medication prescribed today.  See med list below. - Patient with regularly elevated blood pressure in office. - Reviewed goal BP as consistently less than 150/90, with goal of less than 140/90.  - Lifestyle changes such as dash diet and engaging in a regular exercise program discussed with patient.  Educational handouts provided  - Ambulatory BP monitoring encouraged. Keep log and bring in next OV.  Sit for 15-20 minutes prior to taking measurement, without exposure to caffeine, stress, emotions, or other stimulation prior.  - Advised patient to  check blood pressure for 3-4 days at home prior to starting medicine, to establish baseline.  If blood pressure not at goal at home, patient knows to begin medication.  - While on BP medication, patient knows to monitor for S-E and let us know if his BP drops too low.  3. BMI Counseling Explained to patient what BMI refers to, and what it means medically.    Told patient to think about it as a "medical risk stratification measurement" and how increasing BMI is associated with increasing risk/ or worsening state of various diseases such as hypertension, hyperlipidemia, diabetes, premature OA, depression etc.  American Heart Association guidelines for healthy diet, basically Mediterranean diet, and exercise guidelines of 30 minutes 5 days per week or more discussed in detail.  Health counseling performed.  All questions answered.  4. Lifestyle & Preventative Health Maintenance - Advised patient to continue working toward exercising to improve overall mental, physical, and emotional health.    - Encouraged patient to engage in daily physical activity, especially a formal exercise routine.  Recommended that the patient eventually strive for at least 150 minutes of moderate cardiovascular activity per week according to guidelines established by the [facility].   - Healthy dietary habits encouraged, including low-carb, and high amounts of lean protein in diet.   - Patient should also consume adequate amounts of water - half of body weight in oz of water per day.  5. Follow-Up - Return in 4-6 weeks for evaluation of progress on BP medication. - Continue to return for CPE and  fasting lab work as scheduled.  - Patient knows to call in and schedule OV sooner if he experiences acute concerns, or desires to come in to discuss other health goals.   Orders Placed This Encounter  Procedures  . Ambulatory referral to Gastroenterology    Meds ordered this encounter  Medications  . losartan (COZAAR) 50 MG  tablet    Sig: Take 1 tablet (50 mg total) by mouth daily.    Dispense:  90 tablet    Refill:  3    Gross side effects, risk and benefits, and alternatives of medications and treatment plan in general discussed with patient.  Patient is aware that all medications have potential side effects and we are unable to predict every side effect or drug-drug interaction that may occur.   Patient will call with any questions prior to using medication if they have concerns.  Expresses verbal understanding and consents to current therapy and treatment regimen.  No barriers to understanding were identified.  Red flag symptoms and signs discussed in detail.  Patient expressed understanding regarding what to do in case of emergency\urgent symptoms  Please see AVS handed out to patient at the end of our visit for further patient instructions/ counseling done pertaining to today's office visit.   Return for 4-6 wks BP new med- obtain CMP at time of OV.    Note:  This note was prepared with assistance of Dragon voice recognition software. Occasional wrong-word or sound-a-like substitutions may have occurred due to the inherent limitations of voice recognition software.   This document serves as a record of services personally performed by Mellody Dance, DO. It was created on her behalf by Toni Amend, a trained medical scribe. The creation of this record is based on the scribe's personal observations and the provider's statements to them.   I have reviewed the above medical documentation for accuracy and completeness and I concur.  Mellody Dance 08/30/17 9:07 PM   -----------------------------------------------------------------------------------------------------------   Subjective:     HPI: Tommy Hobbs is a 77 y.o. male who presents to [place] [facility] today for issues as discussed below.  Patient has not been checking his blood pressure at home.  Notes that he's  "sure it's up all the time."  Feels that his blood pressure has been elevated for years and years.  Patient has never been on blood pressure medications.  Notes that Fish Oil supplementation makes him burp.  Family History of Colon Cancer Dad had colon cancer at age 52; died at age 70. Brother with liver cancer.  Notes "we have a family history of cancer."  Denies family history of skin cancers.  Mother died of "not being able to breathe," but she never smoked.  1. 77 y.o. male here for cholesterol follow-up.   - Patient reports good compliance with medications or treatment plan  - Denies medication S-E.  Tolerating new dose well and taking nightly.   - Smoking Status noted   - He denies new onset of: chest pain, exercise intolerance, shortness of breath, dizziness, visual changes, headache, lower extremity swelling or claudication.   Denies myalgias  The cholesterol last visit was:  Lab Results  Component Value Date   CHOL 220 (H) 02/12/2017   HDL 35 (L) 02/12/2017   LDLCALC 158 (H) 02/12/2017   TRIG 136 02/12/2017   CHOLHDL 6.3 (H) 02/12/2017    Hepatic Function Latest Ref Rng & Units 04/02/2017 02/12/2017 01/19/2015  Total Protein 6.0 -  8.5 g/dL 6.7 6.9 -  Albumin 3.5 - 4.8 g/dL 4.4 4.3 -  AST 0 - 40 IU/L '21 25 22  ' ALT 0 - 44 IU/L 29 31 42(A)  Alk Phosphatase 39 - 117 IU/L 92 88 67  Total Bilirubin 0.0 - 1.2 mg/dL 0.5 0.5 -  Bilirubin, Direct 0.00 - 0.40 mg/dL 0.16 - -      Wt Readings from Last 3 Encounters:  08/30/17 185 lb 4.8 oz (84.1 kg)  05/02/17 185 lb 14.4 oz (84.3 kg)  02/05/17 190 lb 3.2 oz (86.3 kg)   BP Readings from Last 3 Encounters:  08/30/17 (!) 145/79  05/02/17 (!) 158/77  02/05/17 (!) 155/68   Pulse Readings from Last 3 Encounters:  08/30/17 65  05/02/17 69  02/05/17 74   BMI Readings from Last 3 Encounters:  08/30/17 25.84 kg/m  05/02/17 25.93 kg/m  02/05/17 26.53 kg/m     Patient Care Team    Relationship Specialty  Notifications Start End  Mellody Dance, DO PCP - General Family Medicine  01/25/17   Irine Seal, MD Attending Physician Urology  01/25/17   Melida Quitter, MD Consulting Physician Otolaryngology  01/25/17      Patient Active Problem List   Diagnosis Date Noted  . 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 Low HDL (under 40) 05/02/2017    Priority: High  . GERD (gastroesophageal reflux disease) 01/25/2017    Priority: Medium  . h/o Dysrhythmia 01/25/2017    Priority: Medium  . Family history of diabetes mellitus in brother 01/25/2017    Priority: Low  . Family history of renal cancer- brother 01/25/2017    Priority: Low  . h/o PTSD (post-traumatic stress disorder)- served in [place] 01/25/2017    Priority: Low  . Hyperlipidemia 08/30/2017  . Overweight (BMI 25.0-29.9) 08/30/2017  . Family history of colon cancer in father 08/30/2017  . Essential hypertension 08/30/2017  . Acute maxillary sinusitis 01/30/2017  . Cough in adult 01/30/2017  . History of BPH 01/25/2017  . DDD (degenerative disc disease) 01/25/2017  . h/o Compression fracture of L1 lumbar vertebra ([place]) 08/17/2014  . Left inguinal hernia 07/17/2013    Past Medical history, Surgical history, Family history, Social history, Allergies and Medications have been entered into the medical record, reviewed and changed as needed.    Current Meds  Medication Sig  . Aspirin-Acetaminophen (GOODYS BODY PAIN PO) Take 1 packet by mouth as needed (pain).  Marland Kitchen atorvastatin (LIPITOR) 80 MG tablet Take 1 tablet (80 mg total) by mouth at bedtime.  Marland Kitchen ibuprofen (ADVIL,MOTRIN) 200 MG tablet Take 800 mg by mouth every 8 (eight) hours as needed.  . pantoprazole (PROTONIX) 40 MG tablet TAKE 1 TABLET BY MOUTH  DAILY  . Psyllium (VEGETABLE LAXATIVE PO) Take 1 tablet by mouth daily as needed. [name] Brand    Allergies:  Allergies  Allergen Reactions  . Other Hives and Rash    Pt has issues with certain ingredients in  lotion.  imidazolidinyl urea, DMDM hydantoin, 2-bromo-2-nitropropane-1, diazolidinyl urea, quaternium 15, cocamidopropyl dimethyl glycine  . Cocamidopropyl Betaine Hives and Rash  . Esomeprazole Magnesium Hives and Rash  . Tegopen [Cloxacillin Sodium] Rash     Review of Systems:  A fourteen system review of systems was performed and found to be positive as per HPI.   Objective:   Blood pressure (!) 145/79, pulse 65, height '5\' 11"'  (1.803 m), weight 185 lb 4.8 oz (84.1 kg), SpO2 98 %. Body mass index  is 25.84 kg/m. General:  Well Developed, well nourished, appropriate for stated age.  Neuro:  Alert and oriented,  extra-ocular muscles intact  HEENT:  Normocephalic, atraumatic, neck supple, no carotid bruits appreciated  Skin:  no gross rash, warm, pink. Cardiac:  RRR, S1 S2 Respiratory:  ECTA B/L and A/P, Not using accessory muscles, speaking in full sentences- unlabored. Vascular:  Ext warm, no cyanosis apprec.; cap RF less 2 sec. Psych:  No HI/SI, judgement and insight good, Euthymic mood. Full Affect.

## 2017-08-30 NOTE — Patient Instructions (Signed)
Please check your blood pressure at home twice daily as we discussed and write it down in a little notebook.  Please bring this in next office visit in approximately 4 to 6 weeks so we can review it also we will obtain some nonfasting blood work in the future when you come.   DASH Eating Plan DASH stands for "Dietary Approaches to Stop Hypertension." The DASH eating plan is a healthy eating plan that has been shown to reduce high blood pressure (hypertension). It may also reduce your risk for type 2 diabetes, heart disease, and stroke. The DASH eating plan may also help with weight loss. What are tips for following this plan? General guidelines  Avoid eating more than 2,300 mg (milligrams) of salt (sodium) a day. If you have hypertension, you may need to reduce your sodium intake to 1,500 mg a day.  Limit alcohol intake to no more than 1 drink a day for nonpregnant women and 2 drinks a day for men. One drink equals 12 oz of beer, 5 oz of wine, or 1 oz of hard liquor.  Work with your health care provider to maintain a healthy body weight or to lose weight. Ask what an ideal weight is for you.  Get at least 30 minutes of exercise that causes your heart to beat faster (aerobic exercise) most days of the week. Activities may include walking, swimming, or biking.  Work with your health care provider or diet and nutrition specialist (dietitian) to adjust your eating plan to your individual calorie needs. Reading food labels  Check food labels for the amount of sodium per serving. Choose foods with less than 5 percent of the Daily Value of sodium. Generally, foods with less than 300 mg of sodium per serving fit into this eating plan.  To find whole grains, look for the word "whole" as the first word in the ingredient list. Shopping  Buy products labeled as "low-sodium" or "no salt added."  Buy fresh foods. Avoid canned foods and premade or frozen meals. Cooking  Avoid adding salt when cooking.  Use salt-free seasonings or herbs instead of table salt or sea salt. Check with your health care provider or pharmacist before using salt substitutes.  Do not fry foods. Cook foods using healthy methods such as baking, boiling, grilling, and broiling instead.  Cook with heart-healthy oils, such as olive, canola, soybean, or sunflower oil. Meal planning   Eat a balanced diet that includes: ? 5 or more servings of fruits and vegetables each day. At each meal, try to fill half of your plate with fruits and vegetables. ? Up to 6-8 servings of whole grains each day. ? Less than 6 oz of lean meat, poultry, or fish each day. A 3-oz serving of meat is about the same size as a deck of cards. One egg equals 1 oz. ? 2 servings of low-fat dairy each day. ? A serving of nuts, seeds, or beans 5 times each week. ? Heart-healthy fats. Healthy fats called Omega-3 fatty acids are found in foods such as flaxseeds and coldwater fish, like sardines, salmon, and mackerel.  Limit how much you eat of the following: ? Canned or prepackaged foods. ? Food that is high in trans fat, such as fried foods. ? Food that is high in saturated fat, such as fatty meat. ? Sweets, desserts, sugary drinks, and other foods with added sugar. ? Full-fat dairy products.  Do not salt foods before eating.  Try to eat at least 2  vegetarian meals each week.  Eat more home-cooked food and less restaurant, buffet, and fast food.  When eating at a restaurant, ask that your food be prepared with less salt or no salt, if possible. What foods are recommended? The items listed may not be a complete list. Talk with your dietitian about what dietary choices are best for you. Grains Whole-grain or whole-wheat bread. Whole-grain or whole-wheat pasta. Brown rice. [place]. Bulgur. Whole-grain and low-sodium cereals. Pita bread. Low-fat, low-sodium crackers. Whole-wheat flour tortillas. Vegetables Fresh or frozen vegetables (raw,  steamed, roasted, or grilled). Low-sodium or reduced-sodium tomato and vegetable juice. Low-sodium or reduced-sodium tomato sauce and tomato paste. Low-sodium or reduced-sodium canned vegetables. Fruits All fresh, dried, or frozen fruit. Canned fruit in natural juice (without added sugar). Meat and other protein foods Skinless chicken or [place]. Ground chicken or [place]. Pork with fat trimmed off. Fish and seafood. Egg whites. Dried beans, peas, or lentils. Unsalted nuts, nut butters, and seeds. Unsalted canned beans. Lean cuts of beef with fat trimmed off. Low-sodium, lean deli meat. Dairy Low-fat (1%) or fat-free (skim) milk. Fat-free, low-fat, or reduced-fat cheeses. Nonfat, low-sodium ricotta or cottage cheese. Low-fat or nonfat yogurt. Low-fat, low-sodium cheese. Fats and oils Soft margarine without trans fats. Vegetable oil. Low-fat, reduced-fat, or light mayonnaise and salad dressings (reduced-sodium). Canola, safflower, olive, soybean, and sunflower oils. Avocado. Seasoning and other foods Herbs. Spices. Seasoning mixes without salt. Unsalted popcorn and pretzels. Fat-free sweets. What foods are not recommended? The items listed may not be a complete list. Talk with your dietitian about what dietary choices are best for you. Grains Baked goods made with fat, such as croissants, muffins, or some breads. Dry pasta or rice meal packs. Vegetables Creamed or fried vegetables. Vegetables in a cheese sauce. Regular canned vegetables (not low-sodium or reduced-sodium). Regular canned tomato sauce and paste (not low-sodium or reduced-sodium). Regular tomato and vegetable juice (not low-sodium or reduced-sodium). Angie Fava. Olives. Fruits Canned fruit in a light or heavy syrup. Fried fruit. Fruit in cream or butter sauce. Meat and other protein foods Fatty cuts of meat. Ribs. Fried meat. Berniece Salines. Sausage. Bologna and other processed lunch meats. Salami. Fatback. Hotdogs. Bratwurst. Salted nuts and  seeds. Canned beans with added salt. Canned or smoked fish. Whole eggs or egg yolks. Chicken or [place] with skin. Dairy Whole or 2% milk, cream, and half-and-half. Whole or full-fat cream cheese. Whole-fat or sweetened yogurt. Full-fat cheese. Nondairy creamers. Whipped toppings. Processed cheese and cheese spreads. Fats and oils Butter. Stick margarine. Lard. Shortening. Ghee. Bacon fat. Tropical oils, such as coconut, palm kernel, or palm oil. Seasoning and other foods Salted popcorn and pretzels. Onion salt, garlic salt, seasoned salt, table salt, and sea salt. Worcestershire sauce. Tartar sauce. Barbecue sauce. Teriyaki sauce. Soy sauce, including reduced-sodium. Steak sauce. Canned and packaged gravies. Fish sauce. Oyster sauce. Cocktail sauce. Horseradish that you find on the shelf. Ketchup. Mustard. Meat flavorings and tenderizers. Bouillon cubes. Hot sauce and Tabasco sauce. Premade or packaged marinades. Premade or packaged taco seasonings. Relishes. Regular salad dressings. Where to find more information:  National Heart, Lung, and [place]: https://wilson-eaton.com/  American Heart Association: www.heart.org Summary  The DASH eating plan is a healthy eating plan that has been shown to reduce high blood pressure (hypertension). It may also reduce your risk for type 2 diabetes, heart disease, and stroke.  With the DASH eating plan, you should limit salt (sodium) intake to 2,300 mg a day. If you have hypertension, you may need  to reduce your sodium intake to 1,500 mg a day.  When on the DASH eating plan, aim to eat more fresh fruits and vegetables, whole grains, lean proteins, low-fat dairy, and heart-healthy fats.  Work with your health care provider or diet and nutrition specialist (dietitian) to adjust your eating plan to your individual calorie needs. This information is not intended to replace advice given to you by your health care provider. Make sure you discuss any questions you  have with your health care provider. Document Released: 12/15/2010 Document Revised: 12/20/2015 Document Reviewed: 12/20/2015 Elsevier Interactive Patient Education  2018 [employer].     How to Take Your Blood Pressure Blood pressure is a measurement of how strongly your blood is pressing against the walls of your arteries. Arteries are blood vessels that carry blood from your heart throughout your body. Your health care provider takes your blood pressure at each office visit. You can also take your own blood pressure at home with a blood pressure machine. You may need to take your own blood pressure:  To confirm a diagnosis of high blood pressure (hypertension).  To monitor your blood pressure over time.  To make sure your blood pressure medicine is working.  Supplies needed: To take your blood pressure, you will need a blood pressure machine. You can buy a blood pressure machine, or blood pressure monitor, at most drugstores or online. There are several types of home blood pressure monitors. When choosing one, consider the following:  Choose a monitor that has an arm cuff.  Choose a monitor that wraps snugly around your upper arm. You should be able to fit only one finger between your arm and the cuff.  Do not choose a monitor that measures your blood pressure from your wrist or finger.  Your health care provider can suggest a reliable monitor that will meet your needs. How to prepare To get the most accurate reading, avoid the following for 30 minutes before you check your blood pressure:  Drinking caffeine.  Drinking alcohol.  Eating.  Smoking.  Exercising.  Five minutes before you check your blood pressure:  Empty your bladder.  Sit quietly without talking in a dining chair, rather than in a soft couch or armchair.  How to take your blood pressure To check your blood pressure, follow the instructions in the manual that came with your blood pressure monitor. If  you have a digital blood pressure monitor, the instructions may be as follows: 1. Sit up straight. 2. Place your feet on the floor. Do not cross your ankles or legs. 3. Rest your left arm at the level of your heart on a table or desk or on the arm of a chair. 4. Pull up your shirt sleeve. 5. Wrap the blood pressure cuff around the upper part of your left arm, 1 inch (2.5 cm) above your elbow. It is best to wrap the cuff around bare skin. 6. Fit the cuff snugly around your arm. You should be able to place only one finger between the cuff and your arm. 7. Position the cord inside the groove of your elbow. 8. Press the power button. 9. Sit quietly while the cuff inflates and deflates. 10. Read the digital reading on the monitor screen and write it down (record it). 11. Wait 2-3 minutes, then repeat the steps, starting at step 1.  What does my blood pressure reading mean? A blood pressure reading consists of a higher number over a lower number. Ideally, your blood  pressure should be below 120/80. The first ("top") number is called the systolic pressure. It is a measure of the pressure in your arteries as your heart beats. The second ("bottom") number is called the diastolic pressure. It is a measure of the pressure in your arteries as the heart relaxes. Blood pressure is classified into four stages. The following are the stages for adults who do not have a short-term serious illness or a chronic condition. Systolic pressure and diastolic pressure are measured in a unit called mm Hg. Normal  Systolic pressure: below 015.  Diastolic pressure: below 80. Elevated  Systolic pressure: 868-257.  Diastolic pressure: below 80. Hypertension stage 1  Systolic pressure: 493-552.  Diastolic pressure: 17-47. Hypertension stage 2  Systolic pressure: 159 or above.  Diastolic pressure: 90 or above. You can have prehypertension or hypertension even if only the systolic or only the diastolic number in  your reading is higher than normal. Follow these instructions at home:  Check your blood pressure as often as recommended by your health care provider.  Take your monitor to the next appointment with your health care provider to make sure: ? That you are using it correctly. ? That it provides accurate readings.  Be sure you understand what your goal blood pressure numbers are.  Tell your health care provider if you are having any side effects from blood pressure medicine. Contact a health care provider if:  Your blood pressure is consistently high. Get help right away if:  Your systolic blood pressure is higher than 180.  Your diastolic blood pressure is higher than 110. This information is not intended to replace advice given to you by your health care provider. Make sure you discuss any questions you have with your health care provider. Document Released: 06/04/2015 Document Revised: 08/17/2015 Document Reviewed: 06/04/2015 Elsevier Interactive Patient Education  [employer].

## 2017-10-18 ENCOUNTER — Ambulatory Visit (INDEPENDENT_AMBULATORY_CARE_PROVIDER_SITE_OTHER): Payer: Medicare Other | Admitting: Family Medicine

## 2017-10-18 ENCOUNTER — Encounter: Payer: Self-pay | Admitting: Family Medicine

## 2017-10-18 VITALS — BP 133/79 | HR 64 | Ht 71.0 in | Wt 182.8 lb

## 2017-10-18 DIAGNOSIS — E786 Lipoprotein deficiency: Secondary | ICD-10-CM | POA: Diagnosis not present

## 2017-10-18 DIAGNOSIS — Z79899 Other long term (current) drug therapy: Secondary | ICD-10-CM

## 2017-10-18 DIAGNOSIS — Z23 Encounter for immunization: Secondary | ICD-10-CM | POA: Diagnosis not present

## 2017-10-18 DIAGNOSIS — E785 Hyperlipidemia, unspecified: Secondary | ICD-10-CM

## 2017-10-18 DIAGNOSIS — I10 Essential (primary) hypertension: Secondary | ICD-10-CM

## 2017-10-18 NOTE — Progress Notes (Signed)
Impression and Recommendations:    1. Hyperlipidemia, unspecified hyperlipidemia type   2. Low HDL (under 40)   3. Essential hypertension   4. High risk medication use   5. Flu vaccine need     Hyperlipidemia, unspecified hyperlipidemia type - Plan: Lipid panel, Comprehensive metabolic panel  Low HDL (under 40) - Plan: Lipid panel, Comprehensive metabolic panel  Essential hypertension - Plan: Comprehensive metabolic panel  High risk medication use - Plan: Comprehensive metabolic panel  Flu vaccine need - Plan: Flu vaccine HIGH DOSE PF (Fluzone High dose)  1. Hyperlipidemia - Continue Lipitor nightly as prescribed.  See med list below. - Patient continues tolerating meds well, without complication.  Denies S-E - Re-evaluate lipid panel as scheduled.  - Cholesterol was last evaluated in February 2019. - Started on Lipitor around 2/11/2019due to elevated ASCVD risk. - Patient had hepatic function evaluated on3/25/2019after starting Lipitor around 02/19/2017. Liver function was WNL.  Dose was increased in April 2019.  - Dietary changes such as low saturated & trans fat and low carb/ ketogenic diets discussed with patient.  Encouraged regular exercise and weight loss when appropriate.   - Educational handouts provided at patient's desire.  - Per patient, reports S-E of burps with supplemental fish oil.  2. Blood Pressure - In office last appointment, patient's BP was 145/79. - BP stable at this time. - Continue treatment plan as prescribed.  See med list below. - Patient with regularly elevated blood pressure in office. - Reviewed goal BP as consistently less than 150/90, with goal of less than 140/90.  Reviewed that range around 130/80 is ideal.  - Lifestyle changes such as dash diet and engaging in a regular exercise program discussed with patient.  Educational handouts provided  - Ambulatory BP monitoring encouraged. Keep log and bring in next OV.  Sit for  15-20 minutes prior to taking measurement, without exposure to caffeine, stress, emotions, or other stimulation prior.  - Advised patient to check blood pressure for 3-4 days at home prior to starting medicine, to establish baseline.  If blood pressure not at goal at home, patient knows to begin medication.  - While on BP medication, patient knows to monitor for S-E and let us know if his BP drops too low.  3. Eustachian Tube Dysfunction - Advised the patient to begin using AYR or Neilmed sinus rinses BID followed by flonase BID (one spray to each nostril). Advised that the patient may also incorporate allegra or claritin PRN.   4. BMI Counseling Explained to patient what BMI refers to, and what it means medically.    Told patient to think about it as a "medical risk stratification measurement" and how increasing BMI is associated with increasing risk/ or worsening state of various diseases such as hypertension, hyperlipidemia, diabetes, premature OA, depression etc.  American Heart Association guidelines for healthy diet, basically Mediterranean diet, and exercise guidelines of 30 minutes 5 days per week or more discussed in detail.  Health counseling performed.  All questions answered.  5. Lifestyle & Preventative Health Maintenance - Advised patient to continue working toward exercising to improve overall mental, physical, and emotional health.    - Encouraged patient to engage in daily physical activity, especially a formal exercise routine.  Recommended that the patient eventually strive for at least 150 minutes of moderate cardiovascular activity per week according to guidelines established by the [facility]   - Healthy dietary habits encouraged, including low-carb, and high amounts of lean protein  in diet.   - Patient should also consume adequate amounts of water.  6. Follow-Up - Return as scheduled for evaluation of chronic health goals. - Continue to return for CPE and fasting  lab work as scheduled.  - Patient knows to call in and schedule OV sooner if he experiences acute concerns, or desires to come in to discuss other health goals.   Education and routine counseling performed. Handouts provided.   Orders Placed This Encounter  Procedures  . Flu vaccine HIGH DOSE PF (Fluzone High dose)  . Lipid panel  . Comprehensive metabolic panel    The patient was counseled, risk factors were discussed, anticipatory guidance given.  Gross side effects, risk and benefits, and alternatives of medications discussed with patient.  Patient is aware that all medications have potential side effects and we are unable to predict every side effect or drug-drug interaction that may occur.  Expresses verbal understanding and consents to current therapy plan and treatment regimen.  Return for Hypertension, HLD follow up every 4 mo.  Please see AVS handed out to patient at the end of our visit for further patient instructions/ counseling done pertaining to today's office visit.    Note:  This document was prepared using Dragon voice recognition software and may include unintentional dictation errors.  This document serves as a record of services personally performed by Mellody Dance, DO. It was created on her behalf by Toni Amend, a trained medical scribe. The creation of this record is based on the scribe's personal observations and the provider's statements to them.   I have reviewed the above medical documentation for accuracy and completeness and I concur.  Mellody Dance 10/19/17 10:53 AM    Subjective:    HPI: Tommy Hobbs is a 77 y.o. male who presents to [place] at [facility] today for follow up of [name].    Patient states that he has been doing well.  He feels he's drinking plenty of water and staying active. Notes that they try to walk around [name] at least three times.  Eustachian Tube Dysfunction Notes that  his nose has been bothering him and he's been having some sinus concerns recently with the changing seasons.  He takes no allergy meds or treatment for allergies.  HPI: Cholesterol - Patient reports good compliance with medications or treatment plan. Continues tolerating medication well.  - Denies medication S-E   - Smoking Status noted    - He denies new onset of: chest pain, exercise intolerance, shortness of breath, dizziness, visual changes, headache, lower extremity swelling or claudication.   Denies myalgias.  The cholesterol last visit was:  Lab Results  Component Value Date   CHOL 220 (H) 02/12/2017   HDL 35 (L) 02/12/2017   LDLCALC 158 (H) 02/12/2017   TRIG 136 02/12/2017   CHOLHDL 6.3 (H) 02/12/2017    Hepatic Function Latest Ref Rng & Units 04/02/2017 02/12/2017 01/19/2015  Total Protein 6.0 - 8.5 g/dL 6.7 6.9 -  Albumin 3.5 - 4.8 g/dL 4.4 4.3 -  AST 0 - 40 IU/L '21 25 22  ' ALT 0 - 44 IU/L 29 31 42(A)  Alk Phosphatase 39 - 117 IU/L 92 88 67  Total Bilirubin 0.0 - 1.2 mg/dL 0.5 0.5 -  Bilirubin, Direct 0.00 - 0.40 mg/dL 0.16 - -   HTN:  -  His blood pressure has been controlled at home.  Pt has been checking it regularly at home.  Range at home: 106-136/60's-80's.  Occasionally around 88, 89.  Patient has been feeling fine at home, denies dizziness and lightheadedness.  - Patient reports good compliance with blood pressure medications.  - Denies medication S-E   - Smoking Status noted   - He denies new onset of: chest pain, exercise intolerance, shortness of breath, dizziness, visual changes, headache, lower extremity swelling or claudication.   Last 3 blood pressure readings in our office are as follows: BP Readings from Last 3 Encounters:  10/18/17 133/79  08/30/17 (!) 145/79  05/02/17 (!) 158/77    Pulse Readings from Last 3 Encounters:  10/18/17 64  08/30/17 65  05/02/17 69    Filed Weights   10/18/17 1400  Weight: 182 lb 12.8 oz (82.9 kg)       Patient Care Team    Relationship Specialty Notifications Start End  Mellody Dance, DO PCP - General Family Medicine  01/25/17   Irine Seal, MD Attending Physician Urology  01/25/17   Melida Quitter, MD Consulting Physician Otolaryngology  01/25/17      Lab Results  Component Value Date   CREATININE 1.00 02/12/2017   BUN 12 02/12/2017   NA 136 02/12/2017   K 4.5 02/12/2017   CL 97 02/12/2017   CO2 21 02/12/2017    Lab Results  Component Value Date   CHOL 220 (H) 02/12/2017   CHOL 237 (A) 01/19/2015    Lab Results  Component Value Date   HDL 35 (L) 02/12/2017   HDL 35 01/19/2015    Lab Results  Component Value Date   LDLCALC 158 (H) 02/12/2017   [place] 161 01/19/2015    Lab Results  Component Value Date   TRIG 136 02/12/2017   TRIG 207 (A) 01/19/2015    Lab Results  Component Value Date   CHOLHDL 6.3 (H) 02/12/2017    No results found for: LDLDIRECT ===================================================================   Patient Active Problem List   Diagnosis Date Noted  . 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 Low HDL (under 40) 05/02/2017    Priority: High  . GERD (gastroesophageal reflux disease) 01/25/2017    Priority: Medium  . h/o Dysrhythmia 01/25/2017    Priority: Medium  . Family history of diabetes mellitus in brother 01/25/2017    Priority: Low  . Family history of renal cancer- brother 01/25/2017    Priority: Low  . h/o PTSD (post-traumatic stress disorder)- served in [place] War 01/25/2017    Priority: Low  . Hyperlipidemia 08/30/2017  . Overweight (BMI 25.0-29.9) 08/30/2017  . Family history of colon cancer in father 08/30/2017  . Essential hypertension 08/30/2017  . Acute maxillary sinusitis 01/30/2017  . Cough in adult 01/30/2017  . History of BPH 01/25/2017  . DDD (degenerative disc disease) 01/25/2017  . h/o Compression fracture of L1 lumbar vertebra ([place]) 08/17/2014  . Left inguinal hernia  07/17/2013     Past Medical History:  Diagnosis Date  . Arthritis   . Constipation   . DDD (degenerative disc disease)   . Dysrhythmia    history svt-clean cath 2004- Had SVT "One Time."  . GERD (gastroesophageal reflux disease)   . Headache   . History of BPH   . History of kidney stones    x 1 passed  . PTSD (post-traumatic stress disorder)   . Wears dentures    full top     Past Surgical History:  Procedure Laterality Date  . APPENDECTOMY  1977  . CARDIAC CATHETERIZATION  2004  . CATARACT EXTRACTION, BILATERAL  Bilateral   . CERVICAL FUSION  2011  . CHOLECYSTECTOMY  1977   open  . COLONOSCOPY    . INGUINAL HERNIA REPAIR Right 1977   right  . INGUINAL HERNIA REPAIR Left 08/04/2013   Procedure: OPEN LEFT INGUINAL HERNIA REPAIR;  Surgeon: Adin Hector, MD;  Location: [place];  Service: General;  Laterality: Left;  . INSERTION OF MESH N/A 08/04/2013   Procedure: INSERTION OF MESH;  Surgeon: Adin Hector, MD;  Location: [place];  Service: General;  Laterality: N/A;  . KNEE ARTHROSCOPY Left   . KYPHOPLASTY N/A 08/17/2014   Procedure: Jone Baseman ONE;  Surgeon: Jovita Gamma, MD;  Location: [facility];  Service: Neurosurgery;  Laterality: N/A;  . SUPRAPUBIC PROSTATECTOMY  2012   partial for severe BPH     Family History  Problem Relation Age of Onset  . Cancer Brother        kidney  . Diabetes Brother      Social History   Substance and Sexual Activity  Drug Use No  ,  Social History   Substance and Sexual Activity  Alcohol Use No  ,  Social History   Tobacco Use  Smoking Status Former Smoker  . Years: 30.00  . Last attempt to quit: 07/31/1998  . Years since quitting: 19.2  Smokeless Tobacco Never Used  ,    Current Outpatient Medications on File Prior to Visit  Medication Sig Dispense Refill  . Aspirin-Acetaminophen (GOODYS BODY PAIN PO) Take 1 packet by mouth as needed (pain).    Marland Kitchen atorvastatin  (LIPITOR) 80 MG tablet Take 1 tablet (80 mg total) by mouth at bedtime. 90 tablet 3  . ibuprofen (ADVIL,MOTRIN) 200 MG tablet Take 800 mg by mouth every 8 (eight) hours as needed.    Marland Kitchen losartan (COZAAR) 50 MG tablet Take 1 tablet (50 mg total) by mouth daily. 90 tablet 3  . pantoprazole (PROTONIX) 40 MG tablet TAKE 1 TABLET BY MOUTH  DAILY 90 tablet 1  . Psyllium (VEGETABLE LAXATIVE PO) Take 1 tablet by mouth daily as needed. [name] Brand     No current facility-administered medications on file prior to visit.      Allergies  Allergen Reactions  . Other Hives and Rash    Pt has issues with certain ingredients in lotion.  imidazolidinyl urea, DMDM hydantoin, 2-bromo-2-nitropropane-1, diazolidinyl urea, quaternium 15, cocamidopropyl dimethyl glycine  . Cocamidopropyl Betaine Hives and Rash  . Esomeprazole Magnesium Hives and Rash  . Tegopen [Cloxacillin Sodium] Rash     Review of Systems:   General:  Denies fever, chills Optho/Auditory:   Denies visual changes, blurred vision Respiratory:   Denies SOB, cough, wheeze, DIB  Cardiovascular:   Denies chest pain, palpitations, painful respirations Gastrointestinal:   Denies nausea, vomiting, diarrhea.  Endocrine:     Denies new hot or cold intolerance Musculoskeletal:  Denies joint swelling, gait issues, or new unexplained myalgias/ arthralgias Skin:  Denies rash, suspicious lesions  Neurological:    Denies dizziness, unexplained weakness, numbness  Psychiatric/Behavioral:   Denies mood changes  Objective:    Blood pressure 133/79, pulse 64, height '5\' 11"'  (1.803 m), weight 182 lb 12.8 oz (82.9 kg), SpO2 100 %.  Body mass index is 25.5 kg/m.  General: Well Developed, well nourished, and in no acute distress.  HEENT: Normocephalic, atraumatic, pupils equal round reactive to light, neck supple, No carotid bruits, no JVD. Skin: Warm and dry, cap RF less 2 sec Cardiac: Regular  rate and rhythm, S1, S2 WNL's, no murmurs rubs or  gallops Respiratory: ECTA B/L, Not using accessory muscles, speaking in full sentences. NeuroM-Sk: Ambulates w/o assistance, moves ext * 4 w/o difficulty, sensation grossly intact.  Ext: edema b/l lower ext Psych: No HI/SI, judgement and insight good, Euthymic mood. Full Affect.

## 2017-10-18 NOTE — Patient Instructions (Signed)
Great job on checking your blood pressures at home.  Continue to monitor them and follow-up sooner than planned if there is any questions or concerns.   Check out the DASH diet = 1.5 Gram Low Sodium Diet   A 1.5 gram sodium diet restricts the amount of sodium in the diet to no more than 1.5 g or 1500 mg daily.  The American Heart Association recommends Americans over the age of 16 to consume no more than 1500 mg of sodium each day to reduce the risk of developing high blood pressure.  Research also shows that limiting sodium may reduce heart attack and stroke risk.  Many foods contain sodium for flavor and sometimes as a preservative.  When the amount of sodium in a diet needs to be low, it is important to know what to look for when choosing foods and drinks.  The following includes some information and guidelines to help make it easier for you to adapt to a low sodium diet.    QUICK TIPS  Do not add salt to food.  Avoid convenience items and fast food.  Choose unsalted snack foods.  Buy lower sodium products, often labeled as "lower sodium" or "no salt added."  Check food labels to learn how much sodium is in 1 serving.  When eating at a restaurant, ask that your food be prepared with less salt or none, if possible.    READING FOOD LABELS FOR SODIUM INFORMATION  The nutrition facts label is a good place to find how much sodium is in foods. Look for products with no more than 400 mg of sodium per serving.  Remember that 1.5 g = 1500 mg.  The food label may also list foods as:  Sodium-free: Less than 5 mg in a serving.  Very low sodium: 35 mg or less in a serving.  Low-sodium: 140 mg or less in a serving.  Light in sodium: 50% less sodium in a serving. For example, if a food that usually has 300 mg of sodium is changed to become light in sodium, it will have 150 mg of sodium.  Reduced sodium: 25% less sodium in a serving. For example, if a food that usually has 400 mg of sodium is changed to  reduced sodium, it will have 300 mg of sodium.    CHOOSING FOODS  Grains  Avoid: Salted crackers and snack items. Some cereals, including instant hot cereals. Bread stuffing and biscuit mixes. Seasoned rice or pasta mixes.  Choose: Unsalted snack items. Low-sodium cereals, oats, puffed wheat and rice, shredded wheat. English muffins and bread. Pasta.  Meats  Avoid: Salted, canned, smoked, spiced, pickled meats, including fish and poultry. Bacon, ham, sausage, cold cuts, hot dogs, anchovies.  Choose: Low-sodium canned tuna and salmon. Fresh or frozen meat, poultry, and fish.  Dairy  Avoid: Processed cheese and spreads. Cottage cheese. Buttermilk and condensed milk. Regular cheese.  Choose: Milk. Low-sodium cottage cheese. Yogurt. Sour cream. Low-sodium cheese.  Fruits and Vegetables  Avoid: Regular canned vegetables. Regular canned tomato sauce and paste. Frozen vegetables in sauces. Olives. Angie Fava. Relishes. Sauerkraut.  Choose: Low-sodium canned vegetables. Low-sodium tomato sauce and paste. Frozen or fresh vegetables. Fresh and frozen fruit.  Condiments  Avoid: Canned and packaged gravies. Worcestershire sauce. Tartar sauce. Barbecue sauce. Soy sauce. Steak sauce. Ketchup. Onion, garlic, and table salt. Meat flavorings and tenderizers.  Choose: Fresh and dried herbs and spices. Low-sodium varieties of mustard and ketchup. Lemon juice. Tabasco sauce. Horseradish.    SAMPLE  1.5 GRAM SODIUM MEAL PLAN:   Breakfast / Sodium (mg)  1 cup low-fat milk / 143 mg  1 whole-wheat English muffin / 240 mg  1 tbs heart-healthy margarine / 153 mg  1 hard-boiled egg / 139 mg  1 small orange / 0 mg  Lunch / Sodium (mg)  1 cup raw carrots / 76 mg  2 tbs no salt added peanut butter / 5 mg  2 slices whole-wheat bread / 270 mg  1 tbs jelly / 6 mg   cup red grapes / 2 mg  Dinner / Sodium (mg)  1 cup whole-wheat pasta / 2 mg  1 cup low-sodium tomato sauce / 73 mg  3 oz lean ground beef / 57 mg  1  small side salad (1 cup raw spinach leaves,  cup cucumber,  cup yellow bell pepper) with 1 tsp olive oil and 1 tsp red wine vinegar / 25 mg  Snack / Sodium (mg)  1 container low-fat vanilla yogurt / 107 mg  3 graham cracker squares / 127 mg  Nutrient Analysis  Calories: 1745  Protein: 75 g  Carbohydrate: 237 g  Fat: 57 g  Sodium: 1425 mg  Document Released: 12/26/2004 Document Revised: 09/07/2010 Document Reviewed: 03/29/2009  [facility] Patient Information 2012 [place], [place].     Hypertension Hypertension, commonly called high blood pressure, is when the force of blood pumping through the arteries is too strong. The arteries are the blood vessels that carry blood from the heart throughout the body. Hypertension forces the heart to work harder to pump blood and may cause arteries to become narrow or stiff. Having untreated or uncontrolled hypertension can cause heart attacks, strokes, kidney disease, and other problems. A blood pressure reading consists of a higher number over a lower number. Ideally, your blood pressure should be below 120/80. The first ("top") number is called the systolic pressure. It is a measure of the pressure in your arteries as your heart beats. The second ("bottom") number is called the diastolic pressure. It is a measure of the pressure in your arteries as the heart relaxes. What are the causes? The cause of this condition is not known. What increases the risk? Some risk factors for high blood pressure are under your control. Others are not. Factors you can change  Smoking.  Having type 2 diabetes mellitus, high cholesterol, or both.  Not getting enough exercise or physical activity.  Being overweight.  Having too much fat, sugar, calories, or salt (sodium) in your diet.  Drinking too much alcohol. Factors that are difficult or impossible to change  Having chronic kidney disease.  Having a family history of high blood pressure.  Age. Risk  increases with age.  Race. You may be at higher risk if you are African-American.  Gender. Men are at higher risk than women before age 31. After age 28, women are at higher risk than men.  Having obstructive sleep apnea.  Stress. What are the signs or symptoms? Extremely high blood pressure (hypertensive crisis) may cause:  Headache.  Anxiety.  Shortness of breath.  Nosebleed.  Nausea and vomiting.  Severe chest pain.  Jerky movements you cannot control (seizures).  How is this diagnosed? This condition is diagnosed by measuring your blood pressure while you are seated, with your arm resting on a surface. The cuff of the blood pressure monitor will be placed directly against the skin of your upper arm at the level of your heart. It should be measured at least  twice using the same arm. Certain conditions can cause a difference in blood pressure between your right and left arms. Certain factors can cause blood pressure readings to be lower or higher than normal (elevated) for a short period of time:  When your blood pressure is higher when you are in a health care provider's office than when you are at home, this is called white coat hypertension. Most people with this condition do not need medicines.  When your blood pressure is higher at home than when you are in a health care provider's office, this is called masked hypertension. Most people with this condition may need medicines to control blood pressure.  If you have a high blood pressure reading during one visit or you have normal blood pressure with other risk factors:  You may be asked to return on a different day to have your blood pressure checked again.  You may be asked to monitor your blood pressure at home for 1 week or longer.  If you are diagnosed with hypertension, you may have other blood or imaging tests to help your health care provider understand your overall risk for other conditions. How is this  treated? This condition is treated by making healthy lifestyle changes, such as eating healthy foods, exercising more, and reducing your alcohol intake. Your health care provider may prescribe medicine if lifestyle changes are not enough to get your blood pressure under control, and if:  Your systolic blood pressure is above 130.  Your diastolic blood pressure is above 80.  Your personal target blood pressure may vary depending on your medical conditions, your age, and other factors. Follow these instructions at home: Eating and drinking  Eat a diet that is high in fiber and potassium, and low in sodium, added sugar, and fat. An example eating plan is called the DASH (Dietary Approaches to Stop Hypertension) diet. To eat this way: ? Eat plenty of fresh fruits and vegetables. Try to fill half of your plate at each meal with fruits and vegetables. ? Eat whole grains, such as whole wheat pasta, brown rice, or whole grain bread. Fill about one quarter of your plate with whole grains. ? Eat or drink low-fat dairy products, such as skim milk or low-fat yogurt. ? Avoid fatty cuts of meat, processed or cured meats, and poultry with skin. Fill about one quarter of your plate with lean proteins, such as fish, chicken without skin, beans, eggs, and tofu. ? Avoid premade and processed foods. These tend to be higher in sodium, added sugar, and fat.  Reduce your daily sodium intake. Most people with hypertension should eat less than 1,500 mg of sodium a day.  Limit alcohol intake to no more than 1 drink a day for nonpregnant women and 2 drinks a day for men. One drink equals 12 oz of beer, 5 oz of wine, or 1 oz of hard liquor. Lifestyle  Work with your health care provider to maintain a healthy body weight or to lose weight. Ask what an ideal weight is for you.  Get at least 30 minutes of exercise that causes your heart to beat faster (aerobic exercise) most days of the week. Activities may include  walking, swimming, or biking.  Include exercise to strengthen your muscles (resistance exercise), such as pilates or lifting weights, as part of your weekly exercise routine. Try to do these types of exercises for 30 minutes at least 3 days a week.  Do not use any products that contain nicotine  or tobacco, such as cigarettes and e-cigarettes. If you need help quitting, ask your health care provider.  Monitor your blood pressure at home as told by your health care provider.  Keep all follow-up visits as told by your health care provider. This is important. Medicines  Take over-the-counter and prescription medicines only as told by your health care provider. Follow directions carefully. Blood pressure medicines must be taken as prescribed.  Do not skip doses of blood pressure medicine. Doing this puts you at risk for problems and can make the medicine less effective.  Ask your health care provider about side effects or reactions to medicines that you should watch for. Contact a health care provider if:  You think you are having a reaction to a medicine you are taking.  You have headaches that keep coming back (recurring).  You feel dizzy.  You have swelling in your ankles.  You have trouble with your vision. Get help right away if:  You develop a severe headache or confusion.  You have unusual weakness or numbness.  You feel faint.  You have severe pain in your chest or abdomen.  You vomit repeatedly.  You have trouble breathing. Summary  Hypertension is when the force of blood pumping through your arteries is too strong. If this condition is not controlled, it may put you at risk for serious complications.  Your personal target blood pressure may vary depending on your medical conditions, your age, and other factors. For most people, a normal blood pressure is less than 120/80.  Hypertension is treated with lifestyle changes, medicines, or a combination of both. Lifestyle  changes include weight loss, eating a healthy, low-sodium diet, exercising more, and limiting alcohol. This information is not intended to replace advice given to you by your health care provider. Make sure you discuss any questions you have with your health care provider. Document Released: 12/26/2004 Document Revised: 11/24/2015 Document Reviewed: 11/24/2015 [employer] Interactive Patient Education  [employer].

## 2017-11-07 ENCOUNTER — Other Ambulatory Visit: Payer: Medicare Other

## 2017-11-07 DIAGNOSIS — E786 Lipoprotein deficiency: Secondary | ICD-10-CM

## 2017-11-07 DIAGNOSIS — Z79899 Other long term (current) drug therapy: Secondary | ICD-10-CM | POA: Diagnosis not present

## 2017-11-07 DIAGNOSIS — E785 Hyperlipidemia, unspecified: Secondary | ICD-10-CM

## 2017-11-07 DIAGNOSIS — I10 Essential (primary) hypertension: Secondary | ICD-10-CM | POA: Diagnosis not present

## 2017-11-08 LAB — COMPREHENSIVE METABOLIC PANEL
A/G RATIO: 1.6 (ref 1.2–2.2)
ALBUMIN: 3.9 g/dL (ref 3.5–4.8)
ALK PHOS: 76 IU/L (ref 39–117)
ALT: 38 IU/L (ref 0–44)
AST: 26 IU/L (ref 0–40)
BUN / CREAT RATIO: 13 (ref 10–24)
BUN: 15 mg/dL (ref 8–27)
Bilirubin Total: 0.8 mg/dL (ref 0.0–1.2)
CO2: 24 mmol/L (ref 20–29)
CREATININE: 1.12 mg/dL (ref 0.76–1.27)
Calcium: 9.1 mg/dL (ref 8.6–10.2)
Chloride: 102 mmol/L (ref 96–106)
GFR calc Af Amer: 74 mL/min/{1.73_m2} (ref >59)
GFR, EST NON AFRICAN AMERICAN: 64 mL/min/{1.73_m2} (ref >59)
GLOBULIN, TOTAL: 2.4 g/dL (ref 1.5–4.5)
Glucose: 92 mg/dL (ref 65–99)
POTASSIUM: 4.3 mmol/L (ref 3.5–5.2)
Sodium: 138 mmol/L (ref 134–144)
Total Protein: 6.3 g/dL (ref 6.0–8.5)

## 2017-11-08 LAB — LIPID PANEL
Chol/HDL Ratio: 3.4 ratio (ref 0.0–5.0)
Cholesterol, Total: 106 mg/dL (ref 100–199)
HDL: 31 mg/dL — AB (ref >39)
LDL Calculated: 57 mg/dL (ref 0–99)
Triglycerides: 90 mg/dL (ref 0–149)
VLDL CHOLESTEROL CAL: 18 mg/dL (ref 5–40)

## 2018-01-20 ENCOUNTER — Other Ambulatory Visit: Payer: Self-pay | Admitting: Family Medicine

## 2018-01-20 DIAGNOSIS — K219 Gastro-esophageal reflux disease without esophagitis: Secondary | ICD-10-CM

## 2018-02-18 ENCOUNTER — Ambulatory Visit (INDEPENDENT_AMBULATORY_CARE_PROVIDER_SITE_OTHER): Payer: Medicare Other | Admitting: Family Medicine

## 2018-02-18 ENCOUNTER — Encounter: Payer: Self-pay | Admitting: Family Medicine

## 2018-02-18 VITALS — BP 136/83 | HR 63 | Temp 97.9°F | Wt 185.9 lb

## 2018-02-18 DIAGNOSIS — E785 Hyperlipidemia, unspecified: Secondary | ICD-10-CM

## 2018-02-18 DIAGNOSIS — E786 Lipoprotein deficiency: Secondary | ICD-10-CM

## 2018-02-18 DIAGNOSIS — I10 Essential (primary) hypertension: Secondary | ICD-10-CM | POA: Diagnosis not present

## 2018-02-18 DIAGNOSIS — K219 Gastro-esophageal reflux disease without esophagitis: Secondary | ICD-10-CM

## 2018-02-18 NOTE — Patient Instructions (Signed)
Your goal blood pressure should be 140/90 or less on a regular basis, or medications should be started/ modified.   Please let us know if it is not running at goal at home.  If it is higher than that, please call and let us know within a couple weeks as you may need modification of your medications.   Hypertension Hypertension, commonly called high blood pressure, is when the force of blood pumping through the arteries is too strong. The arteries are the blood vessels that carry blood from the heart throughout the body. Hypertension forces the heart to work harder to pump blood and may cause arteries to become narrow or stiff. Having untreated or uncontrolled hypertension can cause heart attacks, strokes, kidney disease, and other problems. A blood pressure reading consists of a higher number over a lower number. Ideally, your blood pressure should be below 120/80. The first ("top") number is called the systolic pressure. It is a measure of the pressure in your arteries as your heart beats. The second ("bottom") number is called the diastolic pressure. It is a measure of the pressure in your arteries as the heart relaxes. What are the causes? The cause of this condition is not known. What increases the risk? Some risk factors for high blood pressure are under your control. Others are not. Factors you can change  Smoking.  Having type 2 diabetes mellitus, high cholesterol, or both.  Not getting enough exercise or physical activity.  Being overweight.  Having too much fat, sugar, calories, or salt (sodium) in your diet.  Drinking too much alcohol. Factors that are difficult or impossible to change  Having chronic kidney disease.  Having a family history of high blood pressure.  Age. Risk increases with age.  Race. You may be at higher risk if you are African-American.  Gender. Men are at higher risk than women before age 76. After age 2, women are at higher risk than men.  Having  obstructive sleep apnea.  Stress. What are the signs or symptoms? Extremely high blood pressure (hypertensive crisis) may cause:  Headache.  Anxiety.  Shortness of breath.  Nosebleed.  Nausea and vomiting.  Severe chest pain.  Jerky movements you cannot control (seizures).  How is this diagnosed? This condition is diagnosed by measuring your blood pressure while you are seated, with your arm resting on a surface. The cuff of the blood pressure monitor will be placed directly against the skin of your upper arm at the level of your heart. It should be measured at least twice using the same arm. Certain conditions can cause a difference in blood pressure between your right and left arms. Certain factors can cause blood pressure readings to be lower or higher than normal (elevated) for a short period of time:  When your blood pressure is higher when you are in a health care provider's office than when you are at home, this is called white coat hypertension. Most people with this condition do not need medicines.  When your blood pressure is higher at home than when you are in a health care provider's office, this is called masked hypertension. Most people with this condition may need medicines to control blood pressure.  If you have a high blood pressure reading during one visit or you have normal blood pressure with other risk factors:  You may be asked to return on a different day to have your blood pressure checked again.  You may be asked to monitor your blood pressure  at home for 1 week or longer.  If you are diagnosed with hypertension, you may have other blood or imaging tests to help your health care provider understand your overall risk for other conditions. How is this treated? This condition is treated by making healthy lifestyle changes, such as eating healthy foods, exercising more, and reducing your alcohol intake. Your health care provider may prescribe medicine if  lifestyle changes are not enough to get your blood pressure under control, and if:  Your systolic blood pressure is above 130.  Your diastolic blood pressure is above 80.  Your personal target blood pressure may vary depending on your medical conditions, your age, and other factors. Follow these instructions at home: Eating and drinking  Eat a diet that is high in fiber and potassium, and low in sodium, added sugar, and fat. An example eating plan is called the DASH (Dietary Approaches to Stop Hypertension) diet. To eat this way: ? Eat plenty of fresh fruits and vegetables. Try to fill half of your plate at each meal with fruits and vegetables. ? Eat whole grains, such as whole wheat pasta, brown rice, or whole grain bread. Fill about one quarter of your plate with whole grains. ? Eat or drink low-fat dairy products, such as skim milk or low-fat yogurt. ? Avoid fatty cuts of meat, processed or cured meats, and poultry with skin. Fill about one quarter of your plate with lean proteins, such as fish, chicken without skin, beans, eggs, and tofu. ? Avoid premade and processed foods. These tend to be higher in sodium, added sugar, and fat.  Reduce your daily sodium intake. Most people with hypertension should eat less than 1,500 mg of sodium a day.  Limit alcohol intake to no more than 1 drink a day for nonpregnant women and 2 drinks a day for men. One drink equals 12 oz of beer, 5 oz of wine, or 1 oz of hard liquor. Lifestyle  Work with your health care provider to maintain a healthy body weight or to lose weight. Ask what an ideal weight is for you.  Get at least 30 minutes of exercise that causes your heart to beat faster (aerobic exercise) most days of the week. Activities may include walking, swimming, or biking.  Include exercise to strengthen your muscles (resistance exercise), such as pilates or lifting weights, as part of your weekly exercise routine. Try to do these types of exercises  for 30 minutes at least 3 days a week.  Do not use any products that contain nicotine or tobacco, such as cigarettes and e-cigarettes. If you need help quitting, ask your health care provider.  Monitor your blood pressure at home as told by your health care provider.  Keep all follow-up visits as told by your health care provider. This is important. Medicines  Take over-the-counter and prescription medicines only as told by your health care provider. Follow directions carefully. Blood pressure medicines must be taken as prescribed.  Do not skip doses of blood pressure medicine. Doing this puts you at risk for problems and can make the medicine less effective.  Ask your health care provider about side effects or reactions to medicines that you should watch for. Contact a health care provider if:  You think you are having a reaction to a medicine you are taking.  You have headaches that keep coming back (recurring).  You feel dizzy.  You have swelling in your ankles.  You have trouble with your vision. Get help right  away if:  You develop a severe headache or confusion.  You have unusual weakness or numbness.  You feel faint.  You have severe pain in your chest or abdomen.  You vomit repeatedly.  You have trouble breathing. Summary  Hypertension is when the force of blood pumping through your arteries is too strong. If this condition is not controlled, it may put you at risk for serious complications.  Your personal target blood pressure may vary depending on your medical conditions, your age, and other factors. For most people, a normal blood pressure is less than 120/80.  Hypertension is treated with lifestyle changes, medicines, or a combination of both. Lifestyle changes include weight loss, eating a healthy, low-sodium diet, exercising more, and limiting alcohol. This information is not intended to replace advice given to you by your health care provider. Make sure you  discuss any questions you have with your health care provider. Document Released: 12/26/2004 Document Revised: 11/24/2015 Document Reviewed: 11/24/2015 Elsevier Interactive Patient Education  2018 [employer].    How to Take Your Blood Pressure   Blood pressure is a measurement of how strongly your blood is pressing against the walls of your arteries. Arteries are blood vessels that carry blood from your heart throughout your body. Your health care provider takes your blood pressure at each office visit. You can also take your own blood pressure at home with a blood pressure machine. You may need to take your own blood pressure:  To confirm a diagnosis of high blood pressure (hypertension).  To monitor your blood pressure over time.  To make sure your blood pressure medicine is working.  Supplies needed: To take your blood pressure, you will need a blood pressure machine. You can buy a blood pressure machine, or blood pressure monitor, at most drugstores or online. There are several types of home blood pressure monitors. When choosing one, consider the following:  Choose a monitor that has an arm cuff.  Choose a monitor that wraps snugly around your upper arm. You should be able to fit only one finger between your arm and the cuff.  Do not choose a monitor that measures your blood pressure from your wrist or finger.  Your health care provider can suggest a reliable monitor that will meet your needs. How to prepare To get the most accurate reading, avoid the following for 30 minutes before you check your blood pressure:  Drinking caffeine.  Drinking alcohol.  Eating.  Smoking.  Exercising.  Five minutes before you check your blood pressure:  Empty your bladder.  Sit quietly without talking in a dining chair, rather than in a soft couch or armchair.  How to take your blood pressure To check your blood pressure, follow the instructions in the manual that came with your  blood pressure monitor. If you have a digital blood pressure monitor, the instructions may be as follows: 1. Sit up straight. 2. Place your feet on the floor. Do not cross your ankles or legs. 3. Rest your left arm at the level of your heart on a table or desk or on the arm of a chair. 4. Pull up your shirt sleeve. 5. Wrap the blood pressure cuff around the upper part of your left arm, 1 inch (2.5 cm) above your elbow. It is best to wrap the cuff around bare skin. 6. Fit the cuff snugly around your arm. You should be able to place only one finger between the cuff and your arm. 7. Position the  cord inside the groove of your elbow. 8. Press the power button. 9. Sit quietly while the cuff inflates and deflates. 10. Read the digital reading on the monitor screen and write it down (record it). 11. Wait 2-3 minutes, then repeat the steps, starting at step 1.  What does my blood pressure reading mean? A blood pressure reading consists of a higher number over a lower number. Ideally, your blood pressure should be below 120/80. The first ("top") number is called the systolic pressure. It is a measure of the pressure in your arteries as your heart beats. The second ("bottom") number is called the diastolic pressure. It is a measure of the pressure in your arteries as the heart relaxes. Blood pressure is classified into four stages. The following are the stages for adults who do not have a short-term serious illness or a chronic condition. Systolic pressure and diastolic pressure are measured in a unit called mm Hg. Normal  Systolic pressure: below 123456.  Diastolic pressure: below 80. Elevated  Systolic pressure: [id number].  Diastolic pressure: below 80. Hypertension stage 1  Systolic pressure: 0000000.  Diastolic pressure: [id number]. Hypertension stage 2  Systolic pressure: [id number] or above.  Diastolic pressure: 90 or above. You can have prehypertension or hypertension even if only the systolic or  only the diastolic number in your reading is higher than normal. Follow these instructions at home:  Check your blood pressure as often as recommended by your health care provider.  Take your monitor to the next appointment with your health care provider to make sure: ? That you are using it correctly. ? That it provides accurate readings.  Be sure you understand what your goal blood pressure numbers are.  Tell your health care provider if you are having any side effects from blood pressure medicine. Contact a health care provider if:  Your blood pressure is consistently high. Get help right away if:  Your systolic blood pressure is higher than 180.  Your diastolic blood pressure is higher than 110. This information is not intended to replace advice given to you by your health care provider. Make sure you discuss any questions you have with your health care provider. Document Released: 06/04/2015 Document Revised: 08/17/2015 Document Reviewed: 06/04/2015 Elsevier Interactive Patient Education  [employer].

## 2018-02-18 NOTE — Progress Notes (Signed)
Impression and Recommendations:    1. Essential hypertension   2. Hyperlipidemia, unspecified hyperlipidemia type   3. Low HDL (under 40)   4. Gastroesophageal reflux disease, esophagitis presence not specified     1. HTN -Blood pressure in the office today at 165/81. Recheck at 136/83 -Advised the patient to check his blood pressure at least twice a week. Recommended that the patient site quietly for at least 15 minutes prior and then check his blood pressure.  -Patient to bring in blood pressure log on his next visit.   2. HLD -Lipid panel from 11/07/2017 showed: HDL at 31 and LDL at 57.  -Advised the patient to increase his activity level to aid with improving his cholesterol levels -Advised to continue use of Lipitor as prescribed  3. GERD -Well controlled on Protonix. Advised the patient to continue taking protonix as prescribed.      Education and routine counseling performed. Handouts provided.   The patient was counseled, risk factors were discussed, anticipatory guidance given.  Gross side effects, risk and benefits, and alternatives of medications discussed with patient.  Patient is aware that all medications have potential side effects and we are unable to predict every side effect or drug-drug interaction that may occur.  Expresses verbal understanding and consents to current therapy plan and treatment regimen.  Return in about 4 months (around 06/19/2018) for BP, Chol, Gerd.  Please see AVS handed out to patient at the end of our visit for further patient instructions/ counseling done pertaining to today's office visit.    Note:  This document was prepared using Dragon voice recognition software and may include unintentional dictation errors.   This document serves as a record of services personally performed by Mellody Dance, DO. It was created on her behalf by Steva Colder, a trained medical scribe. The creation of this record is based on the scribe's  personal observations and the provider's statements to them.   I have reviewed the above medical documentation for accuracy and completeness and I concur.  Mellody Dance, DO 02/19/2018 7:05 AM       Subjective:    HPI: Tommy Hobbs is a 78 y.o. male who presents to [facility] today for follow up of [place].    He is still going walking at [employer] every morning and walks at least 3 times around the entire store. He doesn't take his dogs out for a walk. He is not sleeping well at this time, however, he will work on that.   HLD:  He is taking Lipitor at night, however, he doesn't have any issues on this medication.   GERD:  His acid reflux is controlled with medications and he doesn't have any concerns.   HTN:  -  His blood pressure has not been controlled at home.  Pt has not been checking it regularly.  - Patient reports good compliance with blood pressure medications  - Denies medication S-E   - Smoking Status noted   - He denies new onset of: chest pain, exercise intolerance, shortness of breath, dizziness, visual changes, headache, lower extremity swelling or claudication.    Last 3 blood pressure readings in our office are as follows: BP Readings from Last 3 Encounters:  02/18/18 136/83  10/18/17 133/79  08/30/17 (!) 145/79    Pulse Readings from Last 3 Encounters:  02/18/18 63  10/18/17 64  08/30/17 65    Filed Weights   02/18/18 1038  Weight: 185 lb 14.4 oz (84.3 kg)      Patient Care Team    Relationship Specialty Notifications Start End  Mellody Dance, DO PCP - General Family Medicine  01/25/17   Irine Seal, MD Attending Physician Urology  01/25/17   Melida Quitter, MD Consulting Physician Otolaryngology  01/25/17      Lab Results  Component Value Date   CREATININE 1.12 11/07/2017   BUN 15 11/07/2017   NA 138 11/07/2017   K 4.3 11/07/2017   CL 102 11/07/2017   CO2 24 11/07/2017    Lab Results   Component Value Date   CHOL 106 11/07/2017   CHOL 220 (H) 02/12/2017   CHOL 237 (A) 01/19/2015    Lab Results  Component Value Date   HDL 31 (L) 11/07/2017   HDL 35 (L) 02/12/2017   HDL 35 01/19/2015    Lab Results  Component Value Date   LDLCALC 57 11/07/2017   LDLCALC 158 (H) 02/12/2017   LDLCALC 161 01/19/2015    Lab Results  Component Value Date   TRIG 90 11/07/2017   TRIG 136 02/12/2017   TRIG 207 (A) 01/19/2015    Lab Results  Component Value Date   CHOLHDL 3.4 11/07/2017   CHOLHDL 6.3 (H) 02/12/2017    No results found for: LDLDIRECT ===================================================================   Patient Active Problem List   Diagnosis Date Noted  . 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 Low HDL (under 40) 05/02/2017    Priority: High  . GERD (gastroesophageal reflux disease) 01/25/2017    Priority: Medium  . h/o Dysrhythmia 01/25/2017    Priority: Medium  . Family history of diabetes mellitus in brother 01/25/2017    Priority: Low  . Family history of renal cancer- brother 01/25/2017    Priority: Low  . h/o PTSD (post-traumatic stress disorder)- served in [place] 01/25/2017    Priority: Low  . Hyperlipidemia 08/30/2017  . Overweight (BMI 25.0-29.9) 08/30/2017  . Family history of colon cancer in father 08/30/2017  . Essential hypertension 08/30/2017  . Acute maxillary sinusitis 01/30/2017  . Cough in adult 01/30/2017  . History of BPH 01/25/2017  . DDD (degenerative disc disease) 01/25/2017  . h/o Compression fracture of L1 lumbar vertebra ([place]) 08/17/2014  . Left inguinal hernia 07/17/2013     Past Medical History:  Diagnosis Date  . Arthritis   . Constipation   . DDD (degenerative disc disease)   . Dysrhythmia    history svt-clean cath 2004- Had SVT "One Time."  . GERD (gastroesophageal reflux disease)   . Headache   . History of BPH   . History of kidney stones    x 1 passed  . PTSD (post-traumatic  stress disorder)   . Wears dentures    full top     Past Surgical History:  Procedure Laterality Date  . APPENDECTOMY  1977  . CARDIAC CATHETERIZATION  2004  . CATARACT EXTRACTION, BILATERAL Bilateral   . CERVICAL FUSION  2011  . CHOLECYSTECTOMY  1977   open  . COLONOSCOPY    . INGUINAL HERNIA REPAIR Right 1977   right  . INGUINAL HERNIA REPAIR Left 08/04/2013   Procedure: OPEN LEFT INGUINAL HERNIA REPAIR;  Surgeon: Adin Hector, MD;  Location: [place];  Service: General;  Laterality: Left;  . INSERTION OF MESH N/A 08/04/2013   Procedure: INSERTION OF MESH;  Surgeon: Adin Hector, MD;  Location: [place];  Service: General;  Laterality: N/A;  . KNEE ARTHROSCOPY Left   . KYPHOPLASTY N/A 08/17/2014   Procedure: Jone Baseman ONE;  Surgeon: Jovita Gamma, MD;  Location: [place] [facility];  Service: Neurosurgery;  Laterality: N/A;  . SUPRAPUBIC PROSTATECTOMY  2012   partial for severe BPH     Family History  Problem Relation Age of Onset  . Cancer Brother        kidney  . Diabetes Brother      Social History   Substance and Sexual Activity  Drug Use No  ,  Social History   Substance and Sexual Activity  Alcohol Use No  ,  Social History   Tobacco Use  Smoking Status Former Smoker  . Years: 30.00  . Last attempt to quit: 07/31/1998  . Years since quitting: 19.5  Smokeless Tobacco Never Used  ,    Current Outpatient Medications on File Prior to Visit  Medication Sig Dispense Refill  . Aspirin-Acetaminophen (GOODYS BODY PAIN PO) Take 1 packet by mouth as needed (pain).    Marland Kitchen atorvastatin (LIPITOR) 80 MG tablet Take 1 tablet (80 mg total) by mouth at bedtime. 90 tablet 3  . ibuprofen (ADVIL,MOTRIN) 200 MG tablet Take 800 mg by mouth every 8 (eight) hours as needed.    Marland Kitchen losartan (COZAAR) 50 MG tablet Take 1 tablet (50 mg total) by mouth daily. 90 tablet 3  . pantoprazole (PROTONIX) 40 MG tablet TAKE 1 TABLET BY MOUTH   DAILY 90 tablet 1  . Psyllium (VEGETABLE LAXATIVE PO) Take 1 tablet by mouth daily as needed. [name] Brand     No current facility-administered medications on file prior to visit.      Allergies  Allergen Reactions  . Other Hives and Rash    Pt has issues with certain ingredients in lotion.  imidazolidinyl urea, DMDM hydantoin, 2-bromo-2-nitropropane-1, diazolidinyl urea, quaternium 15, cocamidopropyl dimethyl glycine  . Cocamidopropyl Betaine Hives and Rash  . Esomeprazole Magnesium Hives and Rash  . Tegopen [Cloxacillin Sodium] Rash     Review of Systems:   General:  Denies fever, chills Optho/Auditory:   Denies visual changes, blurred vision Respiratory:   Denies SOB, cough, wheeze, DIB  Cardiovascular:   Denies chest pain, palpitations, painful respirations Gastrointestinal:   Denies nausea, vomiting, diarrhea.  Endocrine:     Denies new hot or cold intolerance Musculoskeletal:  Denies joint swelling, gait issues, or new unexplained myalgias/ arthralgias Skin:  Denies rash, suspicious lesions  Neurological:    Denies dizziness, unexplained weakness, numbness  Psychiatric/Behavioral:   Denies mood changes  Objective:    Blood pressure 136/83, pulse 63, temperature 97.9 F (36.6 C), weight 185 lb 14.4 oz (84.3 kg), SpO2 98 %.  Body mass index is 25.93 kg/m.  General: Well Developed, well nourished, and in no acute distress.  HEENT: Normocephalic, atraumatic, pupils equal round reactive to light, neck supple, No carotid bruits, no JVD Skin: Warm and dry, cap RF less 2 sec Cardiac: Regular rate and rhythm, S1, S2 WNL's, no murmurs rubs or gallops Respiratory: ECTA B/L, Not using accessory muscles, speaking in full sentences. NeuroM-Sk: Ambulates w/o assistance, moves ext * 4 w/o difficulty, sensation grossly intact.  Ext: scant edema b/l lower ext Psych: No HI/SI, judgement and insight good, Euthymic mood. Full Affect.

## 2018-03-11 DIAGNOSIS — H26492 Other secondary cataract, left eye: Secondary | ICD-10-CM | POA: Diagnosis not present

## 2018-03-11 DIAGNOSIS — H26491 Other secondary cataract, right eye: Secondary | ICD-10-CM | POA: Diagnosis not present

## 2018-03-25 DIAGNOSIS — H26491 Other secondary cataract, right eye: Secondary | ICD-10-CM | POA: Diagnosis not present

## 2018-05-02 ENCOUNTER — Other Ambulatory Visit: Payer: Self-pay

## 2018-05-02 DIAGNOSIS — E7841 Elevated Lipoprotein(a): Secondary | ICD-10-CM

## 2018-05-02 MED ORDER — ATORVASTATIN CALCIUM 80 MG PO TABS: 80.0000 mg | ORAL_TABLET | Freq: Every day | ORAL | 1 refills | Status: DC

## 2018-05-02 NOTE — Progress Notes (Signed)
Pharmacy sent refill request for atorvastatin.  Reviewed chart and sent in medication refill per office policy. MPulliam, CMA/RT(R)

## 2018-06-26 ENCOUNTER — Other Ambulatory Visit: Payer: Self-pay | Admitting: Family Medicine

## 2018-06-26 DIAGNOSIS — K219 Gastro-esophageal reflux disease without esophagitis: Secondary | ICD-10-CM

## 2018-06-27 ENCOUNTER — Other Ambulatory Visit: Payer: Self-pay

## 2018-06-27 ENCOUNTER — Ambulatory Visit (INDEPENDENT_AMBULATORY_CARE_PROVIDER_SITE_OTHER): Payer: Medicare Other | Admitting: Family Medicine

## 2018-06-27 ENCOUNTER — Encounter: Payer: Self-pay | Admitting: Family Medicine

## 2018-06-27 VITALS — BP 129/67 | HR 70 | Temp 97.8°F | Ht 71.0 in | Wt 179.5 lb

## 2018-06-27 DIAGNOSIS — I10 Essential (primary) hypertension: Secondary | ICD-10-CM | POA: Diagnosis not present

## 2018-06-27 DIAGNOSIS — E7841 Elevated Lipoprotein(a): Secondary | ICD-10-CM

## 2018-06-27 DIAGNOSIS — E786 Lipoprotein deficiency: Secondary | ICD-10-CM | POA: Diagnosis not present

## 2018-06-27 DIAGNOSIS — K219 Gastro-esophageal reflux disease without esophagitis: Secondary | ICD-10-CM | POA: Diagnosis not present

## 2018-06-27 NOTE — Progress Notes (Signed)
Telehealth office visit note for Tommy Hobbs, D.O- at Primary Care at [facility]   I connected with current patient today and verified that I am speaking with the correct person using two identifiers.   . Location of the patient: Home . Location of the provider: Office Only the patient (+/- their family members at pt's discretion) and myself were participating in the encounter    - This visit type was conducted due to national recommendations for restrictions regarding the COVID-19 Pandemic (e.g. social distancing) in an effort to limit this patient's exposure and mitigate transmission in our community.  This format is felt to be most appropriate for this patient at this time.   - The patient did not have access to video technology or had technical difficulties with video requiring transitioning to audio format only. - No physical exam could be performed with this format, beyond that communicated to [place] by the patient/ family members as noted.   - Additionally my office staff/ schedulers discussed with the patient that there may be a monetary charge related to this service, depending on their medical insurance.   The patient expressed understanding, and agreed to proceed.       History of Present Illness:  Patient states he and his wife are doing exceptionally well.  He says that happy and a lot of positive things happening around her house with changing carpet and redoing some rooms etc.  They are very happy.  Bp: well controlled at home-runs around what he checked at today.  Chol: At goal when last checked 7 months ago.  Taking medicines and tolerating well.  No side effects.  GERD; continue pantoprazole.  Symptoms well controlled as long as he tries to avoid his trigger foods.    Impression and Recommendations:    1. Elevated lipoprotein(a)   2. Low HDL (under 40)   3. Essential hypertension   4. Gastroesophageal reflux disease, esophagitis presence not specified    -Cholesterol: Continue medications.  LDL at goal 7 months ago.  Will check yearly.  No need for change in med.  Encourage prudent diet and exercise  Hypertension: Blood pressure well controlled.  Patient without any concerns or complaints today.  Continue current medications.  CMP last done 7 months ago.  GERD: Continue medications.  Reminded patient dietary lifestyle changes necessary as well.  -Patient will need Medicare wellness sometime in the near future for telehealth visit - As part of my medical decision making, I reviewed the following data within the [place] History obtained from pt /family, CMA notes reviewed and incorporated if applicable, Labs reviewed, Radiograph/ tests reviewed if applicable and OV notes from prior OV's with me, as well as other specialists she/he has seen since seeing me last, were all reviewed and used in my medical decision making process today.   - Additionally, discussion had with patient regarding txmnt plan, and their biases/concerns about that plan were used in my medical decision making today.   - The patient agreed with the plan and demonstrated an understanding of the instructions.   No barriers to understanding were identified.   - Red flag symptoms and signs discussed in detail.  Patient expressed understanding regarding what to do in case of emergency\ urgent symptoms.  The patient was advised to call back or seek an in-person evaluation if the symptoms worsen or if the condition fails to improve as anticipated.   Return for Medicare wellness visit televisit- near  future;  .    I provided 16 minutes of non-face-to-face time during this encounter,with over 50% of the time in direct counseling on patients medical conditions/ medical concerns.  Additional time was spent with charting and coordination of care after the actual visit commenced.   Note:  This note was prepared with assistance of Dragon voice recognition software.  Occasional wrong-word or sound-a-like substitutions may have occurred due to the inherent limitations of voice recognition software.  Tommy Dance, DO Tommy Dance, DO  06/27/2018 9:54 AM    Patient Care Team    Relationship Specialty Notifications Start End  Tommy Dance, DO PCP - General Family Medicine  01/25/17   Irine Seal, MD Attending Physician Urology  01/25/17   Melida Quitter, MD Consulting Physician Otolaryngology  01/25/17      -Vitals obtained; medications/ allergies reconciled;  personal medical, social, Sx etc.histories were updated by CMA, reviewed by me and are reflected in chart   Patient Active Problem List   Diagnosis Date Noted  . Essential hypertension 08/30/2017    Priority: High  . 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 Low HDL (under 40) 05/02/2017    Priority: High  . GERD (gastroesophageal reflux disease) 01/25/2017    Priority: Medium  . h/o Dysrhythmia 01/25/2017    Priority: Medium  . Family history of diabetes mellitus in brother 01/25/2017    Priority: Low  . Family history of renal cancer- brother 01/25/2017    Priority: Low  . h/o PTSD (post-traumatic stress disorder)- served in [place] 01/25/2017    Priority: Low  . Hyperlipidemia 08/30/2017  . Overweight (BMI 25.0-29.9) 08/30/2017  . Family history of colon cancer in father 08/30/2017  . Acute maxillary sinusitis 01/30/2017  . Cough in adult 01/30/2017  . History of BPH 01/25/2017  . DDD (degenerative disc disease) 01/25/2017  . h/o Compression fracture of L1 lumbar vertebra ([place]) 08/17/2014  . Left inguinal hernia 07/17/2013     Current Meds  Medication Sig  . Aspirin-Acetaminophen (GOODYS BODY PAIN PO) Take 1 packet by mouth as needed (pain).  Marland Kitchen atorvastatin (LIPITOR) 80 MG tablet Take 1 tablet (80 mg total) by mouth at bedtime.  Marland Kitchen ibuprofen (ADVIL,MOTRIN) 200 MG tablet Take 800 mg by mouth every 8 (eight) hours as needed.  Marland Kitchen losartan (COZAAR) 50  MG tablet Take 1 tablet (50 mg total) by mouth daily.  . pantoprazole (PROTONIX) 40 MG tablet TAKE 1 TABLET BY MOUTH  DAILY  . Psyllium (VEGETABLE LAXATIVE PO) Take 1 tablet by mouth daily as needed. [name] Brand     Allergies:  Allergies  Allergen Reactions  . Other Hives and Rash    Pt has issues with certain ingredients in lotion.  imidazolidinyl urea, DMDM hydantoin, 2-bromo-2-nitropropane-1, diazolidinyl urea, quaternium 15, cocamidopropyl dimethyl glycine  . Cocamidopropyl Betaine Hives and Rash  . Esomeprazole Magnesium Hives and Rash  . Tegopen [Cloxacillin Sodium] Rash     ROS:  See above HPI for pertinent positives and negatives   Objective:   Blood pressure 129/67, pulse 70, temperature 97.8 F (36.6 C), height 5\' 11"  (1.803 m), weight 179 lb 8 oz (81.4 kg).  (if some vitals are omitted, this means that patient was UNABLE to obtain them even though they were asked to get them prior to OV today.  They were asked to call us at their earliest convenience with these once obtained. )  General: A & O * 3; sounds in no acute distress; in usual  state of health.  Skin: Pt confirms warm and dry extremities and pink fingertips HEENT: Pt confirms lips non-cyanotic Chest: Patient confirms normal chest excursion and movement Respiratory: speaking in full sentences, no conversational dyspnea; patient confirms no use of accessory muscles Psych: insight appears good, mood- appears full

## 2018-08-28 ENCOUNTER — Other Ambulatory Visit: Payer: Self-pay | Admitting: Family Medicine

## 2018-08-28 DIAGNOSIS — E7841 Elevated Lipoprotein(a): Secondary | ICD-10-CM

## 2018-11-01 ENCOUNTER — Ambulatory Visit (INDEPENDENT_AMBULATORY_CARE_PROVIDER_SITE_OTHER): Payer: Medicare Other

## 2018-11-01 ENCOUNTER — Other Ambulatory Visit: Payer: Self-pay

## 2018-11-01 DIAGNOSIS — Z23 Encounter for immunization: Secondary | ICD-10-CM | POA: Diagnosis not present

## 2018-11-01 NOTE — Progress Notes (Signed)
Pt here for influenza vaccine.  Screening questionnaire reviewed, VIS provided to patient, and any/all patient questions answered.  T. Nelson, CMA  

## 2018-12-10 ENCOUNTER — Other Ambulatory Visit: Payer: Self-pay | Admitting: Family Medicine

## 2018-12-10 DIAGNOSIS — K219 Gastro-esophageal reflux disease without esophagitis: Secondary | ICD-10-CM

## 2019-02-06 ENCOUNTER — Other Ambulatory Visit: Payer: Self-pay | Admitting: Family Medicine

## 2019-02-06 DIAGNOSIS — E7841 Elevated Lipoprotein(a): Secondary | ICD-10-CM

## 2019-02-25 ENCOUNTER — Other Ambulatory Visit: Payer: Self-pay | Admitting: Family Medicine

## 2019-02-25 ENCOUNTER — Ambulatory Visit (INDEPENDENT_AMBULATORY_CARE_PROVIDER_SITE_OTHER): Payer: Medicare Other | Admitting: Family Medicine

## 2019-02-25 ENCOUNTER — Other Ambulatory Visit: Payer: Self-pay

## 2019-02-25 ENCOUNTER — Encounter: Payer: Self-pay | Admitting: Family Medicine

## 2019-02-25 VITALS — BP 137/81 | HR 61 | Temp 97.7°F | Resp 14 | Ht 71.0 in | Wt 189.5 lb

## 2019-02-25 DIAGNOSIS — E785 Hyperlipidemia, unspecified: Secondary | ICD-10-CM

## 2019-02-25 DIAGNOSIS — I10 Essential (primary) hypertension: Secondary | ICD-10-CM | POA: Diagnosis not present

## 2019-02-25 DIAGNOSIS — K219 Gastro-esophageal reflux disease without esophagitis: Secondary | ICD-10-CM | POA: Diagnosis not present

## 2019-02-25 DIAGNOSIS — E663 Overweight: Secondary | ICD-10-CM

## 2019-02-25 DIAGNOSIS — E786 Lipoprotein deficiency: Secondary | ICD-10-CM

## 2019-02-25 NOTE — Progress Notes (Signed)
Impression and Recommendations:    1. Essential hypertension   2. Hyperlipidemia, unspecified hyperlipidemia type   3. Low HDL (under 40)   4. Gastroesophageal reflux disease, unspecified whether esophagitis present   5. Overweight (BMI 25.0-29.9)     - Last OV 6 of 2020.  Patient was advised to return in near future for Medicare Wellness and full fasting blood work, but was lost to follow-up.  - Last labs drawn in 2 of 2019.  Discussed need for re-check and prudent monitoring. - Patient agrees to return for Medicare Wellness and full fasting lab work near future.   Essential Hypertension - Elevated on intake today. - Per patient, blood pressure at home is at goal, stable at this time.  - Patient will continue current treatment regimen.  See med list.  - Counseled patient on pathophysiology of disease and discussed various treatment options, which always includes dietary and lifestyle modification as first line.   - Lifestyle changes such as dash and heart healthy diets and engaging in a regular exercise program discussed extensively with patient.   - Ambulatory blood pressure monitoring encouraged at least 3 times weekly.  Keep log and bring in every office visit.  Reminded patient that if they ever feel poorly in any way, to check their blood pressure and pulse.  - We will continue to monitor.  Hyperlipidemia, Low HDL (under 40) - Last checked in 10 of 2019. - Need for re-check fasting lipid panel near future for evaluation.  - Pt will continue current treatment regimen.  See med list.  Dietary changes such as low saturated & trans fat diets for hyperlipidemia and low carb/ ketogenic diets for hypertriglyceridemia discussed with patient.    Encouraged patient to follow AHA guidelines for regular exercise and also engage in weight loss if BMI above 25.   Educational handouts provided at patient's desire and/ or told to look online at the [employer]  website for further information.  We will continue to monitor and re-check as discussed.  Gastroesophageal reflux disease, unspecified whether esophagitis present - Managed on 40 mg Protonix daily. - Per patient, sometimes experiences breakthrough indigestion and takes Tums for this. - Otherwise stable.  - Encouraged patient to try to reduce his dose of Protonix if at all possible.  - Will continue to monitor.  Health Counseling & Preventative Maintenance - Advised patient to continue working toward exercising to improve overall mental, physical, and emotional health.    - Healthy dietary habits encouraged, including low-carb, and high amounts of lean protein in diet.   - Patient should also consume adequate amounts of water.  - Health counseling performed.  All questions answered.  COVID-19 Vaccine Counseling - COVID-19 counseling provided to patient today. - Discussed expectations for second dose of vaccine with patient today. - Patient knows he may call at any time to discuss further questions or concerns.  Recommendations - Return for Medicare Wellness next available with fasting blood work one week prior.   Orders Placed This Encounter  Procedures  . B12  . CBC  . Comprehensive metabolic panel  . TSH  . T4, free  . Hemoglobin A1c  . Lipid panel  . VITAMIN D 25 Hydroxy (Vit-D Deficiency, Fractures)    Gross side effects, risk and benefits, and alternatives of medications and treatment plan in general discussed with patient.  Patient is aware that all medications have potential side effects and we are unable to predict every side effect  or drug-drug interaction that may occur.   Patient will call with any questions prior to using medication if they have concerns.    Expresses verbal understanding and consents to current therapy and treatment regimen.  No barriers to understanding were identified.  Red flag symptoms and signs discussed in detail.  Patient expressed  understanding regarding what to do in case of emergency\urgent symptoms  Please see AVS handed out to patient at the end of our visit for further patient instructions/ counseling done pertaining to today's office visit.   Return for Medicare Wellness next available with full fasting blood work one week prior.     Note:  This note was prepared with assistance of Dragon voice recognition software. Occasional wrong-word or sound-a-like substitutions may have occurred due to the inherent limitations of voice recognition software.   This document serves as a record of services personally performed by Mellody Dance, DO. It was created on her behalf by Toni Amend, a trained medical scribe. The creation of this record is based on the scribe's personal observations and the provider's statements to them.   This case required medical decision making of at least moderate complexity. The above documentation from Toni Amend, medical scribe, has been reviewed by Marjory Sneddon, D.O.        --------------------------------------------------------------------------------------------------------------------------------------------------------------------------------------------------------------------------------------------    Subjective:     Tommy Hobbs, am serving as scribe for Dr.Felix Meras.   HPI: Tommy Hobbs is a 79 y.o. Hobbs who presents to [place] at [facility] today for issues as discussed Hobbs.  Got his COVID-19 vaccine this past Sunday.  - GERD He continues taking 40 mg Protonix.  Says "if I try to cut it in half, I start having indigestion."  Occasionally sometimes at night, he still has to take a couple of Tums.  Says that this breakthrough indigestion doesn't happen very often, just time to time.  Says his blood pressure is fine at home, but always high at the doctor's.  HPI:  Hypertension:  -  His blood pressure at home has  been running: 123-130-something/70's, 80's.  He checks his blood pressure from time to time, once weekly in general.  Says today "it hasn't been that long since I checked it, and it was fine."  - Patient reports good compliance with medication and/or lifestyle modification  - His denies acute concerns or problems related to treatment plan  - He denies new onset of: chest pain, exercise intolerance, shortness of breath, dizziness, visual changes, headache, lower extremity swelling or claudication.   Last 3 blood pressure readings in our office are as follows: BP Readings from Last 3 Encounters:  02/25/19 137/81  06/27/18 129/67  02/18/18 136/83   Filed Weights   02/25/19 1504  Weight: 189 lb 8 oz (86 kg)   HPI:  Hyperlipidemia:  79 y.o. Hobbs here for cholesterol follow-up.   - Patient reports good compliance with treatment plan of:  medication and/ or lifestyle management.    - Patient denies any acute concerns or problems with management plan.  Continues Lipitor 80 mg.  - He denies new onset of: myalgias, arthralgias, increased fatigue more than normal, chest pains, exercise intolerance, shortness of breath, dizziness, visual changes, headache, lower extremity swelling or claudication.   Most recent cholesterol panel was:  Lab Results  Component Value Date   CHOL 106 11/07/2017   HDL 31 (L) 11/07/2017   LDLCALC 57 11/07/2017   TRIG 90 11/07/2017   CHOLHDL 3.4 11/07/2017  Hepatic Function Latest Ref Rng & Units 11/07/2017 04/02/2017 02/12/2017  Total Protein 6.0 - 8.5 g/dL 6.3 6.7 6.9  Albumin 3.5 - 4.8 g/dL 3.9 4.4 4.3  AST 0 - 40 IU/L '26 21 25  ' ALT 0 - 44 IU/L 38 29 31  Alk Phosphatase 39 - 117 IU/L 76 92 88  Total Bilirubin 0.0 - 1.2 mg/dL 0.8 0.5 0.5  Bilirubin, Direct 0.00 - 0.40 mg/dL - 0.16 -         Wt Readings from Last 3 Encounters:  02/25/19 189 lb 8 oz (86 kg)  06/27/18 179 lb 8 oz (81.4 kg)  02/18/18 185 lb 14.4 oz (84.3 kg)   BP Readings from Last  3 Encounters:  02/25/19 137/81  06/27/18 129/67  02/18/18 136/83   Pulse Readings from Last 3 Encounters:  02/25/19 61  06/27/18 70  02/18/18 63   BMI Readings from Last 3 Encounters:  02/25/19 26.43 kg/m  06/27/18 25.04 kg/m  02/18/18 25.93 kg/m     Patient Care Team    Relationship Specialty Notifications Start End  Mellody Dance, DO PCP - General Family Medicine  01/25/17   Irine Seal, MD Attending Physician Urology  01/25/17   Melida Quitter, MD Consulting Physician Otolaryngology  01/25/17      Patient Active Problem List   Diagnosis Date Noted  . Hyperlipidemia 08/30/2017  . Essential hypertension 08/30/2017  . Elevated lipoprotein(a) 05/02/2017  . Low HDL (under 40) 05/02/2017  . GERD (gastroesophageal reflux disease) 01/25/2017  . h/o Dysrhythmia 01/25/2017  . Family history of diabetes mellitus in brother 01/25/2017  . Family history of renal cancer- brother 01/25/2017  . h/o PTSD (post-traumatic stress disorder)- served in [place] 01/25/2017  . Overweight (BMI 25.0-29.9) 08/30/2017  . Family history of colon cancer in father 08/30/2017  . Acute maxillary sinusitis 01/30/2017  . Cough in adult 01/30/2017  . History of BPH 01/25/2017  . DDD (degenerative disc disease) 01/25/2017  . h/o Compression fracture of L1 lumbar vertebra ([place]) 08/17/2014  . Left inguinal hernia 07/17/2013    Past Medical history, Surgical history, Family history, Social history, Allergies and Medications have been entered into the medical record, reviewed and changed as needed.    No outpatient medications have been marked as taking for the 02/25/19 encounter (Office Visit) with Mellody Dance, DO.    Allergies:  Allergies  Allergen Reactions  . Other Hives and Rash    Pt has issues with certain ingredients in lotion.  imidazolidinyl urea, DMDM hydantoin, 2-bromo-2-nitropropane-1, diazolidinyl urea, quaternium 15, cocamidopropyl dimethyl glycine  . Cocamidopropyl  Betaine Hives and Rash  . Esomeprazole Magnesium Hives and Rash  . Tegopen [Cloxacillin Sodium] Rash     Review of Systems:  A fourteen system review of systems was performed and found to be positive as per HPI.   Objective:   Blood pressure 137/81, pulse 61, temperature 97.7 F (36.5 C), temperature source Oral, resp. rate 14, height '5\' 11"'  (1.803 m), weight 189 lb 8 oz (86 kg), SpO2 100 %. Body mass index is 26.43 kg/m. General:  Well Developed, well nourished, appropriate for stated age.  Neuro:  Alert and oriented,  extra-ocular muscles intact  HEENT:  Normocephalic, atraumatic, neck supple, no carotid bruits appreciated  Skin:  no gross rash, warm, pink. Cardiac:  RRR, S1 S2 Respiratory:  ECTA B/L and A/P, Not using accessory muscles, speaking in full sentences- unlabored. Vascular:  Ext warm, no cyanosis apprec.; cap RF less 2 sec. Psych:  No  HI/SI, judgement and insight good, Euthymic mood. Full Affect.

## 2019-02-26 ENCOUNTER — Telehealth: Payer: Self-pay | Admitting: Family Medicine

## 2019-02-26 DIAGNOSIS — E7841 Elevated Lipoprotein(a): Secondary | ICD-10-CM

## 2019-02-26 MED ORDER — ATORVASTATIN CALCIUM 80 MG PO TABS: 80.0000 mg | ORAL_TABLET | Freq: Every day | ORAL | 1 refills | Status: DC

## 2019-02-26 NOTE — Telephone Encounter (Signed)
Med sent to pharmacy. AS, CMA

## 2019-02-26 NOTE — Telephone Encounter (Signed)
Patient was seen two days ago and needs a refill of his atorvastatin, if approved please send to [facility]

## 2019-02-26 NOTE — Addendum Note (Signed)
Addended by: Mickel Crow on: 02/26/2019 05:21 PM   Modules accepted: Orders

## 2019-03-27 ENCOUNTER — Other Ambulatory Visit: Payer: Medicare Other

## 2019-03-27 ENCOUNTER — Other Ambulatory Visit: Payer: Self-pay

## 2019-03-27 DIAGNOSIS — I10 Essential (primary) hypertension: Secondary | ICD-10-CM | POA: Diagnosis not present

## 2019-03-27 DIAGNOSIS — E663 Overweight: Secondary | ICD-10-CM

## 2019-03-27 DIAGNOSIS — K219 Gastro-esophageal reflux disease without esophagitis: Secondary | ICD-10-CM | POA: Diagnosis not present

## 2019-03-27 DIAGNOSIS — E786 Lipoprotein deficiency: Secondary | ICD-10-CM | POA: Diagnosis not present

## 2019-03-27 DIAGNOSIS — E785 Hyperlipidemia, unspecified: Secondary | ICD-10-CM

## 2019-03-27 DIAGNOSIS — Z0000 Encounter for general adult medical examination without abnormal findings: Secondary | ICD-10-CM | POA: Diagnosis not present

## 2019-03-28 LAB — COMPREHENSIVE METABOLIC PANEL
ALT: 31 IU/L (ref 0–44)
AST: 22 IU/L (ref 0–40)
Albumin/Globulin Ratio: 1.7 (ref 1.2–2.2)
Albumin: 4.2 g/dL (ref 3.7–4.7)
Alkaline Phosphatase: 90 IU/L (ref 39–117)
BUN/Creatinine Ratio: 13 (ref 10–24)
BUN: 14 mg/dL (ref 8–27)
Bilirubin Total: 0.8 mg/dL (ref 0.0–1.2)
CO2: 24 mmol/L (ref 20–29)
Calcium: 9.5 mg/dL (ref 8.6–10.2)
Chloride: 100 mmol/L (ref 96–106)
Creatinine, Ser: 1.06 mg/dL (ref 0.76–1.27)
GFR calc Af Amer: 78 mL/min/{1.73_m2} (ref >59)
GFR calc non Af Amer: 67 mL/min/{1.73_m2} (ref >59)
Globulin, Total: 2.5 g/dL (ref 1.5–4.5)
Glucose: 85 mg/dL (ref 65–99)
Potassium: 4.1 mmol/L (ref 3.5–5.2)
Sodium: 137 mmol/L (ref 134–144)
Total Protein: 6.7 g/dL (ref 6.0–8.5)

## 2019-03-28 LAB — LIPID PANEL
Chol/HDL Ratio: 2.9 ratio (ref 0.0–5.0)
Cholesterol, Total: 113 mg/dL (ref 100–199)
HDL: 39 mg/dL — ABNORMAL LOW (ref >39)
LDL Chol Calc (NIH): 53 mg/dL (ref 0–99)
Triglycerides: 112 mg/dL (ref 0–149)
VLDL Cholesterol Cal: 21 mg/dL (ref 5–40)

## 2019-03-28 LAB — CBC
Hematocrit: 46 % (ref 37.5–51.0)
Hemoglobin: 15.9 g/dL (ref 13.0–17.7)
MCH: 33.2 pg — ABNORMAL HIGH (ref 26.6–33.0)
MCHC: 34.6 g/dL (ref 31.5–35.7)
MCV: 96 fL (ref 79–97)
Platelets: 171 10*3/uL (ref 150–450)
RBC: 4.79 x10E6/uL (ref 4.14–5.80)
RDW: 12.3 % (ref 11.6–15.4)
WBC: 7.1 10*3/uL (ref 3.4–10.8)

## 2019-03-28 LAB — VITAMIN B12: Vitamin B-12: 459 pg/mL (ref 232–1245)

## 2019-03-28 LAB — HEMOGLOBIN A1C
Est. average glucose Bld gHb Est-mCnc: 108 mg/dL
Hgb A1c MFr Bld: 5.4 % (ref 4.8–5.6)

## 2019-03-28 LAB — T4, FREE: Free T4: 1.19 ng/dL (ref 0.82–1.77)

## 2019-03-28 LAB — VITAMIN D 25 HYDROXY (VIT D DEFICIENCY, FRACTURES): Vit D, 25-Hydroxy: 47 ng/mL (ref 30.0–100.0)

## 2019-03-28 LAB — TSH: TSH: 2.84 u[IU]/mL (ref 0.450–4.500)

## 2019-04-03 ENCOUNTER — Ambulatory Visit (INDEPENDENT_AMBULATORY_CARE_PROVIDER_SITE_OTHER): Payer: Medicare Other | Admitting: Family Medicine

## 2019-04-03 ENCOUNTER — Encounter: Payer: Self-pay | Admitting: Family Medicine

## 2019-04-03 ENCOUNTER — Other Ambulatory Visit: Payer: Self-pay

## 2019-04-03 VITALS — BP 125/74 | HR 69 | Temp 98.2°F | Ht 71.0 in | Wt 187.5 lb

## 2019-04-03 DIAGNOSIS — Z0000 Encounter for general adult medical examination without abnormal findings: Secondary | ICD-10-CM

## 2019-04-03 NOTE — Progress Notes (Signed)
Subjective:   Tommy Hobbs is a 79 y.o. male who presents for Medicare Annual/Subsequent preventive examination.   HPI: Patient overall denies concerns today. Denies concerns with memory, vision, or hearing. He experiences ringing in his ears, but notes he's been having this concern for "years and years." He continues to drive. Patient believes his last colonoscopy was "about 10 years ago, and was normal then." Notes "I've never had a broken bone," aside from when he fractured his back after a fall. States he "drinks plenty of water."     Review of Systems:         Objective:    Vitals: BP 125/74   Pulse 69   Temp 98.2 F (36.8 C)   Ht 5\' 11"  (1.803 m)   Wt 187 lb 8 oz (85 kg)   BMI 26.15 kg/m   Body mass index is 26.15 kg/m.  Advanced Directives 01/25/2017 08/13/2014 08/04/2013 07/30/2013  Does Patient Have a Medical Advance Directive? No No Patient does not have advance directive Patient does not have advance directive;Patient would not like information  Would patient like information on creating a medical advance directive? Yes (MAU/Ambulatory/Procedural Areas - Information given) Yes - Educational materials given - -    Tobacco Social History   Tobacco Use  Smoking Status Former Smoker  . Packs/day: 1.00  . Years: 30.00  . Pack years: 30.00  . Types: Cigarettes  . Start date: 01/09/1953  . Quit date: 07/30/2004  . Years since quitting: 14.6  Smokeless Tobacco Never Used     Counseling given: Not Answered   Clinical Intake:                       Past Medical History:  Diagnosis Date  . Arthritis   . Constipation   . DDD (degenerative disc disease)   . Dysrhythmia    history svt-clean cath 2004- Had SVT "One Time."  . GERD (gastroesophageal reflux disease)   . Headache   . History of BPH   . History of kidney stones    x 1 passed  . PTSD (post-traumatic stress disorder)   . Wears dentures    full top   Past Surgical History:   Procedure Laterality Date  . APPENDECTOMY  1977  . CARDIAC CATHETERIZATION  2004  . CATARACT EXTRACTION, BILATERAL Bilateral   . CERVICAL FUSION  2011  . CHOLECYSTECTOMY  1977   open  . COLONOSCOPY    . INGUINAL HERNIA REPAIR Right 1977   right  . INGUINAL HERNIA REPAIR Left 08/04/2013   Procedure: OPEN LEFT INGUINAL HERNIA REPAIR;  Surgeon: Adin Hector, MD;  Location: [facility];  Service: General;  Laterality: Left;  . INSERTION OF MESH N/A 08/04/2013   Procedure: INSERTION OF MESH;  Surgeon: Adin Hector, MD;  Location: [facility];  Service: General;  Laterality: N/A;  . KNEE ARTHROSCOPY Left   . KYPHOPLASTY N/A 08/17/2014   Procedure: Jone Baseman ONE;  Surgeon: Jovita Gamma, MD;  Location: [facility];  Service: Neurosurgery;  Laterality: N/A;  . SUPRAPUBIC PROSTATECTOMY  2012   partial for severe BPH   Family History  Problem Relation Age of Onset  . Cancer Brother        kidney  . Diabetes Brother    Social History   Socioeconomic History  . Marital status: Married    Spouse name: Not on file  . Number of children: Not on file  .  Years of education: Not on file  . Highest education level: Not on file  Occupational History  . Not on file  Tobacco Use  . Smoking status: Former Smoker    Packs/day: 1.00    Years: 30.00    Pack years: 30.00    Types: Cigarettes    Start date: 01/09/1953    Quit date: 07/30/2004    Years since quitting: 14.6  . Smokeless tobacco: Never Used  Substance and Sexual Activity  . Alcohol use: No  . Drug use: No  . Sexual activity: Never  Other Topics Concern  . Not on file  Social History Narrative  . Not on file   Social Determinants of Health   Financial Resource Strain:   . Difficulty of Paying Living Expenses:   Food Insecurity:   . Worried About Charity fundraiser in the Last Year:   . Arboriculturist in the Last Year:   Transportation Needs:   . Film/video editor  (Medical):   Marland Kitchen Lack of Transportation (Non-Medical):   Physical Activity:   . Days of Exercise per Week:   . Minutes of Exercise per Session:   Stress:   . Feeling of Stress :   Social Connections:   . Frequency of Communication with Friends and Family:   . Frequency of Social Gatherings with Friends and Family:   . Attends Religious Services:   . Active Member of Clubs or Organizations:   . Attends Archivist Meetings:   Marland Kitchen Marital Status:     Outpatient Encounter Medications as of 04/03/2019  Medication Sig  . Aspirin-Acetaminophen (GOODYS BODY PAIN PO) Take 1 packet by mouth as needed (pain).  Marland Kitchen atorvastatin (LIPITOR) 80 MG tablet Take 1 tablet (80 mg total) by mouth daily at 6 PM.  . ibuprofen (ADVIL,MOTRIN) 200 MG tablet Take 800 mg by mouth every 8 (eight) hours as needed.  Marland Kitchen losartan (COZAAR) 50 MG tablet TAKE 1 TABLET BY MOUTH  DAILY  . pantoprazole (PROTONIX) 40 MG tablet TAKE 1 TABLET BY MOUTH  DAILY  . Psyllium (VEGETABLE LAXATIVE PO) Take 1 tablet by mouth daily as needed. [name] Brand   No facility-administered encounter medications on file as of 04/03/2019.    Activities of Daily Living In your present state of health, do you have any difficulty performing the following activities: 04/03/2019 02/25/2019  Hearing? N N  Vision? N N  Difficulty concentrating or making decisions? N N  Walking or climbing stairs? N N  Dressing or bathing? N N  Doing errands, shopping? N N  Some recent data might be hidden    Patient Care Team: Mellody Dance, DO as PCP - General (Family Medicine) Irine Seal, MD as Attending Physician (Urology) Melida Quitter, MD as Consulting Physician (Otolaryngology)   Assessment:   This is a routine wellness examination for [place].  Exercise Activities and Dietary recommendations    Goals   None     Fall Risk Fall Risk  04/03/2019 02/25/2019 02/18/2018 08/30/2017 05/02/2017  Falls in the past year? 0 0 0 No No  Number falls in  past yr: 0 0 - - -  Injury with Fall? 0 0 - - -  Follow up Falls evaluation completed Falls evaluation completed Falls evaluation completed - -   Is the patient's home free of loose throw rugs in walkways, pet beds, electrical cords, etc?   yes      Grab bars in the bathroom? no  Handrails on the stairs?   no      Adequate lighting?   yes  Timed Get Up and Go Performed: Telehealth   Depression Screen PHQ 2/9 Scores 04/03/2019 02/25/2019 06/27/2018 02/18/2018  PHQ - 2 Score 0 0 0 0  PHQ- 9 Score 1 0 0 0    Cognitive Function     6CIT Screen 04/03/2019  What Year? 0 points  What month? 0 points  What time? 0 points  Count back from 20 0 points  Months in reverse 0 points  Repeat phrase 0 points  Total Score 0    Immunization History  Administered Date(s) Administered  . Fluad Quad(high Dose 65+) 11/01/2018  . Influenza, High Dose Seasonal PF 10/18/2017    Qualifies for Shingles Vaccine? Patient states he has had these vaccines but doesn't know where.  Tdap: Patient states will make [place] apt to receive Pneumococcal: Patient states he has had these vaccines but doesn't know where.  Screening Tests Health Maintenance  Topic Date Due  . TETANUS/TDAP  Never done  . PNA vac Low Risk Adult (1 of 2 - PCV13) Never done  . INFLUENZA VACCINE  Completed   Cancer Screenings: Lung: Low Dose CT Chest recommended if Age 58-80 years, 30 pack-year currently smoking OR have quit w/in 15years. Patient does qualify.-Patient declined  Colorectal: Patient states he has had done over 10 yrs ago but doesn't know where.   Additional Screenings:  Hepatitis C Screening: Patient declined HIV Screening: Patient declined      Plan:   Medicare Wellness 1:45 PM 04/03/2019 PLAN: - Reviewed need for screenings and immunizations according to guidelines with patient today. - Need for TDAP immunizations. See orders. - Unsure of the date and findings of his last colonoscopy. Per patient, believes his  last colonoscopy was normal and performed about 10 years ago, but cannot remember the specifics, and is unsure if repeat was recommended. - As an alternative colon cancer screening, offered option of ColoGuard to patient today. Patient declines. - He has received both doses of his COVID-19 vaccine. Believes his last COVID-19 vaccination was three weeks ago. - Before obtaining additional vaccinations, advised patient to wait 45 days since his final dose of COVID-19 vaccination. - Shingles vaccine up to date. Will obtain records. - Pneumonia vaccine up to date. Will obtain records. - Per patient, quit smoking in January of 1996. - Per patient, received AAA ultrasound screen through the [place], "several years ago." - Encouraged patient to engage in prudent lifestyle habits, including regular cardiovascular exercise as tolerated, to a goal of 150 minutes of cardiovascular activity per week according to guidelines established by the [facility]. - Advised patient to eat a prudent diet low in refined carbohydrates, high in lean protein, high in antioxidants. - Per patient's request, reviewed recent blood work (03/27/2019) during appointment today. Extensive education and health counseling provdied and all questions answered.    I have personally reviewed and noted the following in the patient's chart:   . Medical and social history . Use of alcohol, tobacco or illicit drugs  . Current medications and supplements . Functional ability and status . Nutritional status . Physical activity . Advanced directives . List of other physicians . Hospitalizations, surgeries, and ER visits in previous 12 months . Vitals . Screenings to include cognitive, depression, and falls . Referrals and appointments  In addition, I have reviewed and discussed with patient certain preventive protocols, quality metrics, and best practice recommendations. A written personalized care plan  for preventive services as well as general preventive  health recommendations were provided to patient.

## 2019-05-08 ENCOUNTER — Other Ambulatory Visit: Payer: Self-pay

## 2019-05-08 ENCOUNTER — Ambulatory Visit: Payer: Medicare Other

## 2019-05-08 DIAGNOSIS — Z23 Encounter for immunization: Secondary | ICD-10-CM

## 2019-05-08 MED ORDER — TETANUS-DIPHTH-ACELL PERTUSSIS 5-2.5-18.5 LF-MCG/0.5 IM SUSP: 0.5000 mL | Freq: Once | INTRAMUSCULAR | 0 refills | Status: AC

## 2019-05-09 ENCOUNTER — Ambulatory Visit: Payer: Medicare Other

## 2019-05-21 ENCOUNTER — Other Ambulatory Visit: Payer: Self-pay | Admitting: Family Medicine

## 2019-05-29 ENCOUNTER — Other Ambulatory Visit: Payer: Self-pay | Admitting: Physician Assistant

## 2019-05-29 DIAGNOSIS — K219 Gastro-esophageal reflux disease without esophagitis: Secondary | ICD-10-CM

## 2019-05-29 MED ORDER — PANTOPRAZOLE SODIUM 40 MG PO TBEC: 40.0000 mg | DELAYED_RELEASE_TABLET | Freq: Every day | ORAL | 0 refills | Status: DC

## 2019-05-29 MED ORDER — LOSARTAN POTASSIUM 50 MG PO TABS: 50.0000 mg | ORAL_TABLET | Freq: Every day | ORAL | 0 refills | Status: DC

## 2019-05-29 NOTE — Telephone Encounter (Signed)
REFILL SENT TO PHARMACY. AS, CMA

## 2019-05-29 NOTE — Telephone Encounter (Signed)
Per patient he rcvd a notification from [facility] that a new script is needed from [place] provider for any Rx refills   --Forwarding request to med asst for this med :  losartan (COZAAR) 50 MG tablet [id number]   Order Details Dose, Route, Frequency: As Directed  Dispense Quantity: 90 tablet Refills: 3       Sig: TAKE 1 TABLET BY MOUTH DAILY     &   pantoprazole (PROTONIX) 40 MG tablet [id number]   Order Details Dose, Route, Frequency: As Directed  Dispense Quantity: 90 tablet Refills: 3   Note to Pharmacy: Requesting 1 year supply      Sig: TAKE 1 TABLET BY MOUTH DAILY   -- Pt's pharmacy is mail order @ :  [place], [place] [phone] (Phone) [phone] (Fax)   --glh

## 2019-07-08 ENCOUNTER — Other Ambulatory Visit: Payer: Self-pay | Admitting: Family Medicine

## 2019-07-08 DIAGNOSIS — E7841 Elevated Lipoprotein(a): Secondary | ICD-10-CM

## 2019-08-14 ENCOUNTER — Other Ambulatory Visit: Payer: Self-pay | Admitting: Physician Assistant

## 2019-08-14 DIAGNOSIS — K219 Gastro-esophageal reflux disease without esophagitis: Secondary | ICD-10-CM

## 2019-10-14 ENCOUNTER — Telehealth: Payer: Self-pay | Admitting: Physician Assistant

## 2019-10-14 ENCOUNTER — Other Ambulatory Visit: Payer: Self-pay | Admitting: Physician Assistant

## 2019-10-14 DIAGNOSIS — K219 Gastro-esophageal reflux disease without esophagitis: Secondary | ICD-10-CM

## 2019-10-14 NOTE — Telephone Encounter (Signed)
Patient needs apt for further refills. Please call patient to schedule. AS< CMA

## 2019-10-15 ENCOUNTER — Other Ambulatory Visit: Payer: Self-pay | Admitting: Physician Assistant

## 2019-10-15 DIAGNOSIS — E7841 Elevated Lipoprotein(a): Secondary | ICD-10-CM

## 2019-10-15 MED ORDER — ATORVASTATIN CALCIUM 80 MG PO TABS: 80.0000 mg | ORAL_TABLET | Freq: Every day | ORAL | 0 refills | Status: DC

## 2019-10-23 ENCOUNTER — Ambulatory Visit (INDEPENDENT_AMBULATORY_CARE_PROVIDER_SITE_OTHER): Payer: Medicare Other | Admitting: Physician Assistant

## 2019-10-23 ENCOUNTER — Other Ambulatory Visit: Payer: Self-pay

## 2019-10-23 VITALS — BP 147/66 | HR 62 | Ht 71.0 in | Wt 187.9 lb

## 2019-10-23 DIAGNOSIS — Z23 Encounter for immunization: Secondary | ICD-10-CM | POA: Diagnosis not present

## 2019-10-23 NOTE — Progress Notes (Signed)
Patient tolerated vaccine well. VIS given. AS< CMA 

## 2019-11-25 ENCOUNTER — Other Ambulatory Visit: Payer: Self-pay | Admitting: Physician Assistant

## 2019-11-25 DIAGNOSIS — E7841 Elevated Lipoprotein(a): Secondary | ICD-10-CM

## 2019-12-23 ENCOUNTER — Other Ambulatory Visit: Payer: Self-pay | Admitting: Physician Assistant

## 2019-12-23 DIAGNOSIS — K219 Gastro-esophageal reflux disease without esophagitis: Secondary | ICD-10-CM

## 2020-02-02 ENCOUNTER — Other Ambulatory Visit: Payer: Self-pay | Admitting: Physician Assistant

## 2020-02-02 DIAGNOSIS — E7841 Elevated Lipoprotein(a): Secondary | ICD-10-CM

## 2020-02-02 DIAGNOSIS — K219 Gastro-esophageal reflux disease without esophagitis: Secondary | ICD-10-CM

## 2020-02-03 ENCOUNTER — Telehealth: Payer: Self-pay | Admitting: Physician Assistant

## 2020-02-03 NOTE — Telephone Encounter (Signed)
Please contact patient and schedule OV for med refills. AS, CMA

## 2020-02-09 ENCOUNTER — Ambulatory Visit (INDEPENDENT_AMBULATORY_CARE_PROVIDER_SITE_OTHER): Payer: Medicare Other | Admitting: Physician Assistant

## 2020-02-09 ENCOUNTER — Encounter: Payer: Self-pay | Admitting: Physician Assistant

## 2020-02-09 ENCOUNTER — Other Ambulatory Visit: Payer: Self-pay

## 2020-02-09 VITALS — BP 138/74 | HR 66 | Ht 71.0 in | Wt 192.1 lb

## 2020-02-09 DIAGNOSIS — I10 Essential (primary) hypertension: Secondary | ICD-10-CM | POA: Diagnosis not present

## 2020-02-09 DIAGNOSIS — K219 Gastro-esophageal reflux disease without esophagitis: Secondary | ICD-10-CM

## 2020-02-09 DIAGNOSIS — E785 Hyperlipidemia, unspecified: Secondary | ICD-10-CM | POA: Diagnosis not present

## 2020-02-09 MED ORDER — LOSARTAN POTASSIUM 50 MG PO TABS: 50.0000 mg | ORAL_TABLET | Freq: Every day | ORAL | 1 refills | Status: DC

## 2020-02-09 NOTE — Patient Instructions (Signed)

## 2020-02-09 NOTE — Assessment & Plan Note (Signed)
-  Discussed with patient decreasing Pantoprazole 40 mg to 20 mg and is reluctant to decrease dose. Recommend to consider to trial lower dose in the near future. -Avoid provocative foods. -Will continue to monitor.

## 2020-02-09 NOTE — Assessment & Plan Note (Signed)
-  Last lipid panel: total cholesterol 113, triglycerides 112, HDL 39, LDL 53 -Continue current medication regimen. -Follow a heart healthy diet. -Will continue to monitor and repeat lipid panel/hepatic function with [facility].

## 2020-02-09 NOTE — Assessment & Plan Note (Signed)
-  Fairly controlled. -Continue current medication regimen. Provided refill. -Continue good hydration and low sodium. -Will continue to monitor and repeat CMP with [facility].

## 2020-02-09 NOTE — Progress Notes (Signed)
Established Patient Office Visit  Subjective:  Patient ID: Tommy Hobbs, male    DOB: 03-15-1940  Age: 80 y.o. MRN: [id number]  CC:  Chief Complaint  Patient presents with  . Hypertension  . Hyperlipidemia    HPI Tommy Hobbs presents for follow up on hypertension and hyperlipidemia. Has no acute concerns.  HTN: Pt denies chest pain, palpitations, dizziness or leg swelling. Taking medication as directed without side effects. Has not been checking blood pressure at home recently. Pt follows a low salt diet. Reports good hydration.  HLD: Pt taking medication as directed without issues. Denies side effects including myalgias and RUQ pain. Tries to limit fried foods.  GERD: Reports tried coming off pantoprazole in the past and it did not go well. Takes 40 mg once daily.   Past Medical History:  Diagnosis Date  . Arthritis   . Constipation   . DDD (degenerative disc disease)   . Dysrhythmia    history svt-clean cath 2004- Had SVT "One Time."  . GERD (gastroesophageal reflux disease)   . Headache   . History of BPH   . History of kidney stones    x 1 passed  . PTSD (post-traumatic stress disorder)   . Wears dentures    full top    Past Surgical History:  Procedure Laterality Date  . APPENDECTOMY  1977  . CARDIAC CATHETERIZATION  2004  . CATARACT EXTRACTION, BILATERAL Bilateral   . CERVICAL FUSION  2011  . CHOLECYSTECTOMY  1977   open  . COLONOSCOPY    . INGUINAL HERNIA REPAIR Right 1977   right  . INGUINAL HERNIA REPAIR Left 08/04/2013   Procedure: OPEN LEFT INGUINAL HERNIA REPAIR;  Surgeon: Adin Hector, MD;  Location: [facility];  Service: General;  Laterality: Left;  . INSERTION OF MESH N/A 08/04/2013   Procedure: INSERTION OF MESH;  Surgeon: Adin Hector, MD;  Location: [facility];  Service: General;  Laterality: N/A;  . KNEE ARTHROSCOPY Left   . KYPHOPLASTY N/A 08/17/2014   Procedure: Jone Baseman ONE;  Surgeon:  Jovita Gamma, MD;  Location: [facility] [facility];  Service: Neurosurgery;  Laterality: N/A;  . SUPRAPUBIC PROSTATECTOMY  2012   partial for severe BPH    Family History  Problem Relation Age of Onset  . Cancer Brother        kidney  . Diabetes Brother     Social History   Socioeconomic History  . Marital status: Married    Spouse name: Not on file  . Number of children: Not on file  . Years of education: Not on file  . Highest education level: Not on file  Occupational History  . Not on file  Tobacco Use  . Smoking status: Former Smoker    Packs/day: 1.00    Years: 30.00    Pack years: 30.00    Types: Cigarettes    Start date: 01/09/1953    Quit date: 07/30/2004    Years since quitting: 15.5  . Smokeless tobacco: Never Used  Vaping Use  . Vaping Use: Never used  Substance and Sexual Activity  . Alcohol use: No  . Drug use: No  . Sexual activity: Never  Other Topics Concern  . Not on file  Social History Narrative  . Not on file   Social Determinants of Health   Financial Resource Strain: Not on file  Food Insecurity: Not on file  Transportation Needs: Not on file  Physical Activity: Not  on file  Stress: Not on file  Social Connections: Not on file  Intimate Partner Violence: Not on file    Outpatient Medications Prior to Visit  Medication Sig Dispense Refill  . Aspirin-Acetaminophen (GOODYS BODY PAIN PO) Take 1 packet by mouth as needed (pain).    Marland Kitchen atorvastatin (LIPITOR) 80 MG tablet Take 1 tablet (80 mg total) by mouth daily. **NEEDS APT FOR REFILLS** 60 tablet 0  . ibuprofen (ADVIL,MOTRIN) 200 MG tablet Take 800 mg by mouth every 8 (eight) hours as needed.    . pantoprazole (PROTONIX) 40 MG tablet Take 1 tablet (40 mg total) by mouth daily. **NEEDS APT FOR REFILLS** 60 tablet 0  . Psyllium (VEGETABLE LAXATIVE PO) Take 1 tablet by mouth daily as needed. [name] Brand    . losartan (COZAAR) 50 MG tablet TAKE 1 TABLET BY MOUTH  DAILY 30 tablet 0   No  facility-administered medications prior to visit.    Allergies  Allergen Reactions  . Other Hives and Rash    Pt has issues with certain ingredients in lotion.  imidazolidinyl urea, DMDM hydantoin, 2-bromo-2-nitropropane-1, diazolidinyl urea, quaternium 15, cocamidopropyl dimethyl glycine  . Azolid [Phenylbutazone]   . Diazolidurea-Iodopropbutylcarb   . Quanterra [Ginkgo Biloba]   . Cocamidopropyl Betaine Hives and Rash  . Esomeprazole Magnesium Hives and Rash  . Tegopen [Cloxacillin Sodium] Rash    ROS Review of Systems A fourteen system review of systems was performed and found to be positive as per HPI.  Objective:    Physical Exam General:  Well Developed, well nourished, appropriate for stated age.  Neuro:  Alert and oriented,  extra-ocular muscles intact  HEENT:  Normocephalic, atraumatic, neck supple Skin:  no gross rash, warm, pink. Cardiac:  RRR, S1 S2, w/o murmur Respiratory:  ECTA B/L w/o wheezing, Not using accessory muscles, speaking in full sentences- unlabored. Vascular:  Ext warm, no cyanosis apprec.; cap RF less 2 sec. Psych:  No HI/SI, judgement and insight good, Euthymic mood. Full Affect.   BP 138/74   Pulse 66   Ht 5\' 11"  (1.803 m)   Wt 192 lb 1.6 oz (87.1 kg)   SpO2 100%   BMI 26.79 kg/m  Wt Readings from Last 3 Encounters:  02/09/20 192 lb 1.6 oz (87.1 kg)  10/23/19 187 lb 14.4 oz (85.2 kg)  04/03/19 187 lb 8 oz (85 kg)     Health Maintenance Due  Topic Date Due  . Hepatitis C Screening  Never done  . COVID-19 Vaccine (1) Never done  . TETANUS/TDAP  Never done  . PNA vac Low Risk Adult (1 of 2 - PCV13) Never done    There are no preventive care reminders to display for this patient.  Lab Results  Component Value Date   TSH 2.840 03/27/2019   Lab Results  Component Value Date   WBC 7.1 03/27/2019   HGB 15.9 03/27/2019   HCT 46.0 03/27/2019   MCV 96 03/27/2019   PLT 171 03/27/2019   Lab Results  Component Value Date   NA 137  03/27/2019   K 4.1 03/27/2019   CO2 24 03/27/2019   GLUCOSE 85 03/27/2019   BUN 14 03/27/2019   CREATININE 1.06 03/27/2019   BILITOT 0.8 03/27/2019   ALKPHOS 90 03/27/2019   AST 22 03/27/2019   ALT 31 03/27/2019   PROT 6.7 03/27/2019   ALBUMIN 4.2 03/27/2019   CALCIUM 9.5 03/27/2019   ANIONGAP 12 07/31/2013   Lab Results  Component Value Date  CHOL 113 03/27/2019   Lab Results  Component Value Date   HDL 39 (L) 03/27/2019   Lab Results  Component Value Date   LDLCALC 53 03/27/2019   Lab Results  Component Value Date   TRIG 112 03/27/2019   Lab Results  Component Value Date   CHOLHDL 2.9 03/27/2019   Lab Results  Component Value Date   HGBA1C 5.4 03/27/2019      Assessment & Plan:   Problem List Items Addressed This Visit      Cardiovascular and Mediastinum   Essential hypertension - Primary    -Fairly controlled. -Continue current medication regimen. Provided refill. -Continue good hydration and low sodium. -Will continue to monitor and repeat CMP with [facility].      Relevant Medications   losartan (COZAAR) 50 MG tablet     Digestive   GERD (gastroesophageal reflux disease) (Chronic)    -Discussed with patient decreasing Pantoprazole 40 mg to 20 mg and is reluctant to decrease dose. Recommend to consider to trial lower dose in the near future. -Avoid provocative foods. -Will continue to monitor.         Other   Hyperlipidemia    -Last lipid panel: total cholesterol 113, triglycerides 112, HDL 39, LDL 53 -Continue current medication regimen. -Follow a heart healthy diet. -Will continue to monitor and repeat lipid panel/hepatic function with [facility].      Relevant Medications   losartan (COZAAR) 50 MG tablet      Meds ordered this encounter  Medications  . losartan (COZAAR) 50 MG tablet    Sig: Take 1 tablet (50 mg total) by mouth daily.    Dispense:  90 tablet    Refill:  1    Order Specific Question:   Supervising Provider    Answer:    Beatrice Lecher D [[id number]]    Follow-up: Return in about 4 months (around 06/08/2020) for [facility] and [facility] 1 wk prior .    Lorrene Reid, PA-C

## 2020-03-12 ENCOUNTER — Other Ambulatory Visit: Payer: Self-pay | Admitting: Physician Assistant

## 2020-03-12 DIAGNOSIS — E7841 Elevated Lipoprotein(a): Secondary | ICD-10-CM

## 2020-03-12 DIAGNOSIS — K219 Gastro-esophageal reflux disease without esophagitis: Secondary | ICD-10-CM

## 2020-05-31 ENCOUNTER — Other Ambulatory Visit: Payer: Self-pay | Admitting: Physician Assistant

## 2020-05-31 DIAGNOSIS — E786 Lipoprotein deficiency: Secondary | ICD-10-CM

## 2020-05-31 DIAGNOSIS — Z0000 Encounter for general adult medical examination without abnormal findings: Secondary | ICD-10-CM

## 2020-05-31 DIAGNOSIS — E785 Hyperlipidemia, unspecified: Secondary | ICD-10-CM

## 2020-05-31 DIAGNOSIS — I10 Essential (primary) hypertension: Secondary | ICD-10-CM

## 2020-06-04 ENCOUNTER — Other Ambulatory Visit: Payer: Self-pay

## 2020-06-04 ENCOUNTER — Other Ambulatory Visit: Payer: Medicare Other

## 2020-06-04 DIAGNOSIS — E785 Hyperlipidemia, unspecified: Secondary | ICD-10-CM | POA: Diagnosis not present

## 2020-06-04 DIAGNOSIS — Z0000 Encounter for general adult medical examination without abnormal findings: Secondary | ICD-10-CM | POA: Diagnosis not present

## 2020-06-04 DIAGNOSIS — E786 Lipoprotein deficiency: Secondary | ICD-10-CM | POA: Diagnosis not present

## 2020-06-04 DIAGNOSIS — I10 Essential (primary) hypertension: Secondary | ICD-10-CM | POA: Diagnosis not present

## 2020-06-05 LAB — LIPID PANEL
Chol/HDL Ratio: 3.6 ratio (ref 0.0–5.0)
Cholesterol, Total: 120 mg/dL (ref 100–199)
HDL: 33 mg/dL — ABNORMAL LOW (ref >39)
LDL Chol Calc (NIH): 63 mg/dL (ref 0–99)
Triglycerides: 135 mg/dL (ref 0–149)
VLDL Cholesterol Cal: 24 mg/dL (ref 5–40)

## 2020-06-05 LAB — CBC
Hematocrit: 44.8 % (ref 37.5–51.0)
Hemoglobin: 15.4 g/dL (ref 13.0–17.7)
MCH: 33.8 pg — ABNORMAL HIGH (ref 26.6–33.0)
MCHC: 34.4 g/dL (ref 31.5–35.7)
MCV: 98 fL — ABNORMAL HIGH (ref 79–97)
Platelets: 185 10*3/uL (ref 150–450)
RBC: 4.56 x10E6/uL (ref 4.14–5.80)
RDW: 12.1 % (ref 11.6–15.4)
WBC: 6.4 10*3/uL (ref 3.4–10.8)

## 2020-06-05 LAB — COMPREHENSIVE METABOLIC PANEL
ALT: 27 IU/L (ref 0–44)
AST: 21 IU/L (ref 0–40)
Albumin/Globulin Ratio: 2 (ref 1.2–2.2)
Albumin: 4.3 g/dL (ref 3.7–4.7)
Alkaline Phosphatase: 73 IU/L (ref 44–121)
BUN/Creatinine Ratio: 15 (ref 10–24)
BUN: 17 mg/dL (ref 8–27)
Bilirubin Total: 0.6 mg/dL (ref 0.0–1.2)
CO2: 22 mmol/L (ref 20–29)
Calcium: 9.6 mg/dL (ref 8.6–10.2)
Chloride: 97 mmol/L (ref 96–106)
Creatinine, Ser: 1.12 mg/dL (ref 0.76–1.27)
Globulin, Total: 2.2 g/dL (ref 1.5–4.5)
Glucose: 84 mg/dL (ref 65–99)
Potassium: 4.5 mmol/L (ref 3.5–5.2)
Sodium: 137 mmol/L (ref 134–144)
Total Protein: 6.5 g/dL (ref 6.0–8.5)
eGFR: 67 mL/min/{1.73_m2} (ref >59)

## 2020-06-05 LAB — HEMOGLOBIN A1C
Est. average glucose Bld gHb Est-mCnc: 105 mg/dL
Hgb A1c MFr Bld: 5.3 % (ref 4.8–5.6)

## 2020-06-05 LAB — TSH: TSH: 2.67 u[IU]/mL (ref 0.450–4.500)

## 2020-06-08 ENCOUNTER — Ambulatory Visit (INDEPENDENT_AMBULATORY_CARE_PROVIDER_SITE_OTHER): Payer: Medicare Other | Admitting: Nurse Practitioner

## 2020-06-08 ENCOUNTER — Other Ambulatory Visit: Payer: Self-pay

## 2020-06-08 ENCOUNTER — Encounter: Payer: Self-pay | Admitting: Nurse Practitioner

## 2020-06-08 VITALS — BP 132/76 | HR 98 | Temp 98.4°F | Ht 71.0 in | Wt 186.5 lb

## 2020-06-08 DIAGNOSIS — E785 Hyperlipidemia, unspecified: Secondary | ICD-10-CM

## 2020-06-08 DIAGNOSIS — K219 Gastro-esophageal reflux disease without esophagitis: Secondary | ICD-10-CM

## 2020-06-08 DIAGNOSIS — Z0000 Encounter for general adult medical examination without abnormal findings: Secondary | ICD-10-CM | POA: Insufficient documentation

## 2020-06-08 DIAGNOSIS — I10 Essential (primary) hypertension: Secondary | ICD-10-CM | POA: Diagnosis not present

## 2020-06-08 NOTE — Progress Notes (Signed)
Reviewed with patient during visit

## 2020-06-08 NOTE — Patient Instructions (Signed)
Health Maintenance After Age 79 After age 79, you are at a higher risk for certain long-term diseases and infections as well as injuries from falls. Falls are a major cause of broken bones and head injuries in people who are older than age 79. Getting regular preventive care can help to keep you healthy and well. Preventive care includes getting regular testing and making lifestyle changes as recommended by your health care provider. Talk with your health care provider about:  Which screenings and tests you should have. A screening is a test that checks for a disease when you have no symptoms.  A diet and exercise plan that is right for you. What should I know about screenings and tests to prevent falls? Screening and testing are the best ways to find a health problem early. Early diagnosis and treatment give you the best chance of managing medical conditions that are common after age 79. Certain conditions and lifestyle choices may make you more likely to have a fall. Your health care provider may recommend:  Regular vision checks. Poor vision and conditions such as cataracts can make you more likely to have a fall. If you wear glasses, make sure to get your prescription updated if your vision changes.  Medicine review. Work with your health care provider to regularly review all of the medicines you are taking, including over-the-counter medicines. Ask your health care provider about any side effects that may make you more likely to have a fall. Tell your health care provider if any medicines that you take make you feel dizzy or sleepy.  Osteoporosis screening. Osteoporosis is a condition that causes the bones to get weaker. This can make the bones weak and cause them to break more easily.  Blood pressure screening. Blood pressure changes and medicines to control blood pressure can make you feel dizzy.  Strength and balance checks. Your health care provider may recommend certain tests to check your  strength and balance while standing, walking, or changing positions.  Foot health exam. Foot pain and numbness, as well as not wearing proper footwear, can make you more likely to have a fall.  Depression screening. You may be more likely to have a fall if you have a fear of falling, feel emotionally low, or feel unable to do activities that you used to do.  Alcohol use screening. Using too much alcohol can affect your balance and may make you more likely to have a fall. What actions can I take to lower my risk of falls? General instructions  Talk with your health care provider about your risks for falling. Tell your health care provider if: ? You fall. Be sure to tell your health care provider about all falls, even ones that seem minor. ? You feel dizzy, sleepy, or off-balance.  Take over-the-counter and prescription medicines only as told by your health care provider. These include any supplements.  Eat a healthy diet and maintain a healthy weight. A healthy diet includes low-fat dairy products, low-fat (lean) meats, and fiber from whole grains, beans, and lots of fruits and vegetables. Home safety  Remove any tripping hazards, such as rugs, cords, and clutter.  Install safety equipment such as grab bars in bathrooms and safety rails on stairs.  Keep rooms and walkways well-lit. Activity  Follow a regular exercise program to stay fit. This will help you maintain your balance. Ask your health care provider what types of exercise are appropriate for you.  If you need a cane or walker,   use it as recommended by your health care provider.  Wear supportive shoes that have nonskid soles.   Lifestyle  Do not drink alcohol if your health care provider tells you not to drink.  If you drink alcohol, limit how much you have: ? 0-1 drink a day for women. ? 0-2 drinks a day for men.  Be aware of how much alcohol is in your drink. In the [place] one drink equals one typical bottle of beer (12  oz), one-half glass of wine (5 oz), or one shot of hard liquor (1 oz).  Do not use any products that contain nicotine or tobacco, such as cigarettes and e-cigarettes. If you need help quitting, ask your health care provider. Summary  Having a healthy lifestyle and getting preventive care can help to protect your health and wellness after age 79.  Screening and testing are the best way to find a health problem early and help you avoid having a fall. Early diagnosis and treatment give you the best chance for managing medical conditions that are more common for people who are older than age 79.  Falls are a major cause of broken bones and head injuries in people who are older than age 79. Take precautions to prevent a fall at home.  Work with your health care provider to learn what changes you can make to improve your health and wellness and to prevent falls. This information is not intended to replace advice given to you by your health care provider. Make sure you discuss any questions you have with your health care provider. Document Revised: 04/18/2018 Document Reviewed: 11/08/2016 Elsevier Patient Education  2021 [employer].  

## 2020-06-08 NOTE — Progress Notes (Signed)
Subjective:   Tommy Hobbs is a 80 y.o. male who presents for Medicare Annual/Subsequent preventive examination. He has no concerns or complaints today. He did have routine, fasting labs done prior to this visit. All were essentially normal and were reviewed with him during today's visit. He denies need for medication refills. States that he uses mail order pharmacy and they are always up to date. He denies chest pain, chest pressure, or shortness of breath. He denies headaches or visual disturbances. He denies abdominal pain, nausea, vomiting, or changes in bowel or bladder habits.   Review of Systems    Review of Systems  Constitutional: Negative for chills, diaphoresis, fever and malaise/fatigue.  HENT: Positive for hearing loss and tinnitus. Negative for congestion, sinus pain and sore throat.   Eyes: Negative.   Respiratory: Negative for cough, shortness of breath and wheezing.   Cardiovascular: Negative for chest pain, palpitations, orthopnea, claudication and leg swelling.  Gastrointestinal: Negative for abdominal pain, blood in stool, constipation, diarrhea, nausea and vomiting.  Genitourinary: Negative for dysuria, flank pain, frequency, hematuria and urgency.  Musculoskeletal: Negative for back pain and myalgias.  Skin: Negative for itching and rash.  Neurological: Negative for dizziness, sensory change, speech change, focal weakness, weakness and headaches.  Endo/Heme/Allergies: Negative.   Psychiatric/Behavioral: Negative for depression. The patient is not nervous/anxious.   All other systems reviewed and are negative.        Objective:    Today's Vitals   06/08/20 0907  BP: 132/76  Pulse: 98  Temp: 98.4 F (36.9 C)  SpO2: 98%  Weight: 186 lb 8 oz (84.6 kg)  Height: 5\' 11"  (1.803 m)   Body mass index is 26.01 kg/m.  Physical Exam Vitals and nursing note reviewed.  Constitutional:      Appearance: Normal appearance. He is well-developed and normal weight.   HENT:     Head: Normocephalic and atraumatic.     Right Ear: Ear canal and external ear normal.     Left Ear: Ear canal and external ear normal.     Ears:     Comments: Cerumen present in both ear canals.     Nose: No congestion.     Right Turbinates: Not enlarged or swollen.     Left Turbinates: Not enlarged or swollen.     Right Sinus: No maxillary sinus tenderness or frontal sinus tenderness.     Left Sinus: No maxillary sinus tenderness or frontal sinus tenderness.     Mouth/Throat:     Lips: Pink. No lesions.     Pharynx: Oropharynx is clear. Uvula midline.  Eyes:     Pupils: Pupils are equal, round, and reactive to light.  Neck:     Vascular: No carotid bruit.  Cardiovascular:     Rate and Rhythm: Normal rate and regular rhythm.     Heart sounds: Normal heart sounds.  Pulmonary:     Effort: Pulmonary effort is normal.     Breath sounds: Normal breath sounds.  Abdominal:     General: Bowel sounds are normal.     Palpations: Abdomen is soft.     Tenderness: There is no abdominal tenderness.  Musculoskeletal:        General: Normal range of motion.     Cervical back: Normal range of motion and neck supple.  Skin:    General: Skin is warm and dry.     Capillary Refill: Capillary refill takes less than 2 seconds.  Neurological:  General: No focal deficit present.     Mental Status: He is alert and oriented to person, place, and time.     Cranial Nerves: No cranial nerve deficit.     Sensory: No sensory deficit.     Motor: No weakness.     Coordination: Coordination normal.     Gait: Gait normal.  Psychiatric:        Mood and Affect: Mood normal.        Behavior: Behavior normal.        Thought Content: Thought content normal.        Judgment: Judgment normal.    Advanced Directives 01/25/2017 08/13/2014 08/04/2013 07/30/2013  Does Patient Have a Medical Advance Directive? No No Patient does not have advance directive Patient does not have advance directive;Patient  would not like information  Would patient like information on creating a medical advance directive? Yes (MAU/Ambulatory/Procedural Areas - Information given) Yes - Educational materials given - -    Current Medications (verified) Outpatient Encounter Medications as of 06/08/2020  Medication Sig  . Aspirin-Acetaminophen (GOODYS BODY PAIN PO) Take 1 packet by mouth as needed (pain).  Marland Kitchen atorvastatin (LIPITOR) 80 MG tablet TAKE 1 TABLET BY MOUTH  DAILY  . ibuprofen (ADVIL,MOTRIN) 200 MG tablet Take 800 mg by mouth every 8 (eight) hours as needed.  Marland Kitchen losartan (COZAAR) 50 MG tablet Take 1 tablet (50 mg total) by mouth daily.  . pantoprazole (PROTONIX) 40 MG tablet TAKE 1 TABLET BY MOUTH  DAILY  . Psyllium (VEGETABLE LAXATIVE PO) Take 1 tablet by mouth daily as needed. [name] Brand   No facility-administered encounter medications on file as of 06/08/2020.    Allergies (verified) Other, Azolid [phenylbutazone], Diazolidurea-iodopropbutylcarb, Derrill Kay [ginkgo biloba], Cocamidopropyl betaine, Esomeprazole magnesium, and Tegopen [cloxacillin sodium]   History: Past Medical History:  Diagnosis Date  . Arthritis   . Constipation   . DDD (degenerative disc disease)   . Dysrhythmia    history svt-clean cath 2004- Had SVT "One Time."  . GERD (gastroesophageal reflux disease)   . Headache   . History of BPH   . History of kidney stones    x 1 passed  . PTSD (post-traumatic stress disorder)   . Wears dentures    full top   Past Surgical History:  Procedure Laterality Date  . APPENDECTOMY  1977  . CARDIAC CATHETERIZATION  2004  . CATARACT EXTRACTION, BILATERAL Bilateral   . CERVICAL FUSION  2011  . CHOLECYSTECTOMY  1977   open  . COLONOSCOPY    . INGUINAL HERNIA REPAIR Right 1977   right  . INGUINAL HERNIA REPAIR Left 08/04/2013   Procedure: OPEN LEFT INGUINAL HERNIA REPAIR;  Surgeon: Adin Hector, MD;  Location: [place];  Service: General;  Laterality: Left;  .  INSERTION OF MESH N/A 08/04/2013   Procedure: INSERTION OF MESH;  Surgeon: Adin Hector, MD;  Location: [place];  Service: General;  Laterality: N/A;  . KNEE ARTHROSCOPY Left   . KYPHOPLASTY N/A 08/17/2014   Procedure: Jone Baseman ONE;  Surgeon: Jovita Gamma, MD;  Location: [facility];  Service: Neurosurgery;  Laterality: N/A;  . SUPRAPUBIC PROSTATECTOMY  2012   partial for severe BPH   Family History  Problem Relation Age of Onset  . Cancer Brother        kidney  . Diabetes Brother    Social History   Socioeconomic History  . Marital status: Married    Spouse name: Not on  file  . Number of children: Not on file  . Years of education: Not on file  . Highest education level: Not on file  Occupational History  . Not on file  Tobacco Use  . Smoking status: Former Smoker    Packs/day: 1.00    Years: 30.00    Pack years: 30.00    Types: Cigarettes    Start date: 01/09/1953    Quit date: 07/30/2004    Years since quitting: 15.8  . Smokeless tobacco: Never Used  Vaping Use  . Vaping Use: Never used  Substance and Sexual Activity  . Alcohol use: No  . Drug use: No  . Sexual activity: Never  Other Topics Concern  . Not on file  Social History Narrative  . Not on file   Social Determinants of Health   Financial Resource Strain: Not on file  Food Insecurity: Not on file  Transportation Needs: Not on file  Physical Activity: Not on file  Stress: Not on file  Social Connections: Not on file    Tobacco Counseling: Patient is not a smoker     Diabetic?no     Activities of Daily Living In your present state of health, do you have any difficulty performing the following activities: 06/08/2020 02/09/2020  Hearing? N N  Vision? N N  Difficulty concentrating or making decisions? N N  Walking or climbing stairs? N N  Dressing or bathing? N N  Doing errands, shopping? N N  Some recent data might be hidden    Patient Care Team: Lorrene Reid, PA-C as PCP - General Irine Seal, MD as Attending Physician (Urology) Melida Quitter, MD as Consulting Physician (Otolaryngology)  Indicate any recent Medical Services you may have received from other than [facility] providers in the past year (date may be approximate).     Assessment:  1. Encounter for Medicare annual wellness exam Medicare wellness visit.   2. Essential hypertension Stable. Continue bp medications as previously prescribed   3. Hyperlipidemia, unspecified hyperlipidemia type Reviewed labs. Hdl just a little low but cholesterol panel, as a whole is within normal limits. Continue atorvastatin as prescribed. Repeat labs in one year.   4. Gastroesophageal reflux disease without esophagitis Stable. Continue pantoprazole as prescribed    Depression Screen PHQ 2/9 Scores 06/08/2020 02/09/2020 04/03/2019 02/25/2019 06/27/2018 02/18/2018 10/18/2017  PHQ - 2 Score 0 0 0 0 0 0 0  PHQ- 9 Score 0 0 1 0 0 0 0    Fall Risk Fall Risk  06/08/2020 02/09/2020 04/03/2019 02/25/2019 02/18/2018  Falls in the past year? 0 0 0 0 0  Number falls in past yr: 0 - 0 0 -  Injury with Fall? 0 - 0 0 -  Follow up Falls evaluation completed Falls evaluation completed Falls evaluation completed Falls evaluation completed Falls evaluation completed    [place]:  Any stairs in or around the home? No  If so, are there any without handrails? No  Home free of loose throw rugs in walkways, pet beds, electrical cords, etc? Yes  Adequate lighting in your home to reduce risk of falls? Yes   ASSISTIVE DEVICES UTILIZED TO PREVENT FALLS:  Life alert? No  Use of a cane, walker or w/c? No  Grab bars in the bathroom? No  Shower chair or bench in shower? No  Elevated toilet seat or a handicapped toilet? No   TIMED UP AND GO:  Was the test performed? Yes .  Length of time to ambulate 10 feet: 15  sec.   Gait steady and fast with assistive device  Cognitive  Function:     6CIT Screen 06/08/2020 04/03/2019  What Year? 0 points 0 points  What month? 0 points 0 points  What time? 0 points 0 points  Count back from 20 0 points 0 points  Months in reverse 0 points 0 points  Repeat phrase 0 points 0 points  Total Score 0 0    Immunizations Immunization History  Administered Date(s) Administered  . Fluad Quad(high Dose 65+) 11/01/2018  . Influenza, High Dose Seasonal PF 10/18/2017, 10/23/2019  . Influenza, Seasonal, Injecte, Preservative Fre 10/26/2008  . Influenza-Unspecified 11/12/2006, 12/23/2007  . Pneumococcal Polysaccharide-23 12/23/2007    TDAP status: Up to date  Flu Vaccine status: Up to date  Pneumococcal vaccine status: Up to date  COVID 19 vaccine status: patient states he received both Pfizer vaccines  Qualifies for Shingles Vaccine? No   Zostavax completed No   Shingrix Completed?: Yes  Screening Tests Health Maintenance  Topic Date Due  . COVID-19 Vaccine (1) Never done  . Hepatitis C Screening  Never done  . TETANUS/TDAP  Never done  . Zoster Vaccines- Shingrix (1 of 2) Never done  . PNA vac Low Risk Adult (2 of 2 - PCV13) 12/22/2008  . INFLUENZA VACCINE  08/09/2020  . HPV VACCINES  Aged Out    Health Maintenance  Health Maintenance Due  Topic Date Due  . COVID-19 Vaccine (1) Never done  . Hepatitis C Screening  Never done  . TETANUS/TDAP  Never done  . Zoster Vaccines- Shingrix (1 of 2) Never done  . PNA vac Low Risk Adult (2 of 2 - PCV13) 12/22/2008    Colorectal cancer screening: No longer required.   Lung Cancer Screening: (Low Dose CT Chest recommended if Age 64-80 years, 30 pack-year currently smoking OR have quit w/in 15years.) does not qualify.     Additional Screening:  Hepatitis C Screening: does not qualify; Completed   Vision Screening: Recommended annual ophthalmology exams for early detection of glaucoma and other disorders of the eye. Is the patient up to date with their annual  eye exam?  No  Who is the provider or what is the name of the office in which the patient attends annual eye exams?  If pt is not established with a provider, would they like to be referred to a provider to establish care? No .   Dental Screening: Recommended annual dental exams for proper oral hygiene  Community Resource Referral / Chronic Care Management: CRR required this visit?  No   CCM required this visit?  No      Plan:     I have personally reviewed and noted the following in the patient's chart:   . Medical and social history . Use of alcohol, tobacco or illicit drugs  . Current medications and supplements including opioid prescriptions. Patient is not currently taking opioid prescriptions. . Functional ability and status . Nutritional status . Physical activity . Advanced directives . List of other physicians . Hospitalizations, surgeries, and ER visits in previous 12 months . Vitals . Screenings to include cognitive, depression, and falls . Referrals and appointments  In addition, I have reviewed and discussed with patient certain preventive protocols, quality metrics, and best practice recommendations. A written personalized care plan for preventive services as well as general preventive health recommendations were provided to patient.     Ronnell Freshwater, NP  06/08/2020      

## 2020-06-09 ENCOUNTER — Ambulatory Visit: Payer: Medicare Other | Admitting: Nurse Practitioner

## 2020-07-13 ENCOUNTER — Other Ambulatory Visit: Payer: Self-pay | Admitting: Physician Assistant

## 2020-07-13 DIAGNOSIS — I10 Essential (primary) hypertension: Secondary | ICD-10-CM

## 2020-08-26 ENCOUNTER — Other Ambulatory Visit: Payer: Self-pay | Admitting: Physician Assistant

## 2020-08-26 DIAGNOSIS — K219 Gastro-esophageal reflux disease without esophagitis: Secondary | ICD-10-CM

## 2020-08-26 DIAGNOSIS — E7841 Elevated Lipoprotein(a): Secondary | ICD-10-CM

## 2020-10-06 ENCOUNTER — Other Ambulatory Visit: Payer: Self-pay | Admitting: Physician Assistant

## 2020-10-06 DIAGNOSIS — I10 Essential (primary) hypertension: Secondary | ICD-10-CM

## 2020-11-08 ENCOUNTER — Encounter: Payer: Self-pay | Admitting: Physician Assistant

## 2020-11-08 ENCOUNTER — Other Ambulatory Visit: Payer: Self-pay

## 2020-11-08 ENCOUNTER — Ambulatory Visit (INDEPENDENT_AMBULATORY_CARE_PROVIDER_SITE_OTHER): Payer: Medicare Other | Admitting: Physician Assistant

## 2020-11-08 VITALS — BP 114/65 | HR 80 | Temp 98.2°F | Ht 71.0 in | Wt 185.2 lb

## 2020-11-08 DIAGNOSIS — J0110 Acute frontal sinusitis, unspecified: Secondary | ICD-10-CM

## 2020-11-08 MED ORDER — AZITHROMYCIN 250 MG PO TABS: ORAL_TABLET | ORAL | 0 refills | Status: AC

## 2020-11-08 NOTE — Patient Instructions (Signed)

## 2020-11-08 NOTE — Progress Notes (Signed)
Acute Office Visit  Subjective:    Patient ID: Tommy Hobbs, male    DOB: [birth year], 80 y.o.   MRN: [id number]  Chief Complaint  Patient presents with   Acute Visit   Cough   Nasal Congestion   Sore Throat    HPI Patient is in today for c/o sore throat, runny nose, mild dry cough, sinus pressure, nasal congestion, had chills last night. States symptoms started last Tuesday and started feeling better last Thursday but then symptoms progressed over the weekend. Denies chest pain, shortness of breath, n/v/d or fever. States did an at-home Covid test last Thursday which resulted negative. Has been taking over-the-counter medication (cannot recall medications) and doing nasal lavage.    Past Medical History:  Diagnosis Date   Arthritis    Constipation    DDD (degenerative disc disease)    Dysrhythmia    history svt-clean cath 2004- Had SVT "One Time."   GERD (gastroesophageal reflux disease)    Headache    History of BPH    History of kidney stones    x 1 passed   PTSD (post-traumatic stress disorder)    Wears dentures    full top    Past Surgical History:  Procedure Laterality Date   APPENDECTOMY  1977   CARDIAC CATHETERIZATION  2004   CATARACT EXTRACTION, BILATERAL Bilateral    CERVICAL FUSION  2011   CHOLECYSTECTOMY  1977   open   COLONOSCOPY     INGUINAL HERNIA REPAIR Right 1977   right   INGUINAL HERNIA REPAIR Left 08/04/2013   Procedure: OPEN LEFT INGUINAL HERNIA REPAIR;  Surgeon: Adin Hector, MD;  Location: [place];  Service: General;  Laterality: Left;   INSERTION OF MESH N/A 08/04/2013   Procedure: INSERTION OF MESH;  Surgeon: Adin Hector, MD;  Location: [place];  Service: General;  Laterality: N/A;   KNEE ARTHROSCOPY Left    KYPHOPLASTY N/A 08/17/2014   Procedure: Jone Baseman ONE;  Surgeon: Jovita Gamma, MD;  Location: [facility];  Service: Neurosurgery;  Laterality: N/A;   SUPRAPUBIC PROSTATECTOMY   2012   partial for severe BPH    Family History  Problem Relation Age of Onset   Cancer Brother        kidney   Diabetes Brother     Social History   Socioeconomic History   Marital status: Married    Spouse name: Not on file   Number of children: Not on file   Years of education: Not on file   Highest education level: Not on file  Occupational History   Not on file  Tobacco Use   Smoking status: Former    Packs/day: 1.00    Years: 30.00    Pack years: 30.00    Types: Cigarettes    Start date: 01/09/1953    Quit date: 07/30/2004    Years since quitting: 16.2   Smokeless tobacco: Never  Vaping Use   Vaping Use: Never used  Substance and Sexual Activity   Alcohol use: No   Drug use: No   Sexual activity: Never  Other Topics Concern   Not on file  Social History Narrative   Not on file   Social Determinants of Health   Financial Resource Strain: Not on file  Food Insecurity: Not on file  Transportation Needs: Not on file  Physical Activity: Not on file  Stress: Not on file  Social Connections: Not on file  Intimate Partner  Violence: Not on file    Outpatient Medications Prior to Visit  Medication Sig Dispense Refill   aspirin 81 MG chewable tablet CHEW ONE TABLET BY MOUTH ONCE EVERY DAY     Aspirin-Acetaminophen (GOODYS BODY PAIN PO) Take 1 packet by mouth as needed (pain).     atorvastatin (LIPITOR) 80 MG tablet TAKE 1 TABLET BY MOUTH  DAILY 90 tablet 0   ibuprofen (ADVIL,MOTRIN) 200 MG tablet Take 800 mg by mouth every 8 (eight) hours as needed.     losartan (COZAAR) 50 MG tablet TAKE 1 TABLET BY MOUTH  DAILY 90 tablet 0   pantoprazole (PROTONIX) 40 MG tablet TAKE 1 TABLET BY MOUTH  DAILY 90 tablet 0   Psyllium (VEGETABLE LAXATIVE PO) Take 1 tablet by mouth daily as needed. [name] Brand     No facility-administered medications prior to visit.    Allergies  Allergen Reactions   Other Hives and Rash    Pt has issues with certain ingredients in  lotion.  imidazolidinyl urea, DMDM hydantoin, 2-bromo-2-nitropropane-1, diazolidinyl urea, quaternium 15, cocamidopropyl dimethyl glycine   Azolid [Phenylbutazone]    Diazolidurea-Iodopropbutylcarb    Quanterra [Ginkgo Biloba]    Cocamidopropyl Betaine Hives and Rash   Esomeprazole Magnesium Hives and Rash   Tegopen [Cloxacillin Sodium] Rash    Review of Systems Review of Systems:  A fourteen system review of systems was performed and found to be positive as per HPI.  Objective:    Physical Exam General:  Well Developed, well nourished, appropriate for stated age.  Neuro:  Alert and oriented,  extra-ocular muscles intact  HEENT:  Normocephalic, atraumatic, tenderness of frontal sinus, clear conjunctiva, normal TM's of both ears, boggy turbinates, erythematous posterior oropharynx,  neck supple,+cervical adenopathy (left submandibular gland) Skin:  no gross rash, warm, pink. Cardiac:  RRR, S1 S2 Respiratory:  CTA B/L w/o wheezing, crackles or rales, Not using accessory muscles, speaking in full sentences- unlabored. Vascular:  Ext warm, no cyanosis apprec. Psych:  No HI/SI, judgement and insight good, Euthymic mood. Full Affect.  BP 114/65   Pulse 80   Temp 98.2 F (36.8 C)   Ht '5\' 11"'  (1.803 m)   Wt 185 lb 3.2 oz (84 kg)   SpO2 98%   BMI 25.83 kg/m  Wt Readings from Last 3 Encounters:  11/08/20 185 lb 3.2 oz (84 kg)  06/08/20 186 lb 8 oz (84.6 kg)  02/09/20 192 lb 1.6 oz (87.1 kg)    Health Maintenance Due  Topic Date Due   Zoster Vaccines- Shingrix (1 of 2) Never done   Pneumonia Vaccine 66+ Years old (2 - PCV) 12/22/2008   COVID-19 Vaccine (3 - Booster for Pfizer series) 05/14/2019   INFLUENZA VACCINE  08/09/2020    There are no preventive care reminders to display for this patient.   Lab Results  Component Value Date   TSH 2.670 06/04/2020   Lab Results  Component Value Date   WBC 6.4 06/04/2020   HGB 15.4 06/04/2020   HCT 44.8 06/04/2020   MCV 98 (H)  06/04/2020   PLT 185 06/04/2020   Lab Results  Component Value Date   NA 137 06/04/2020   K 4.5 06/04/2020   CO2 22 06/04/2020   GLUCOSE 84 06/04/2020   BUN 17 06/04/2020   CREATININE 1.12 06/04/2020   BILITOT 0.6 06/04/2020   ALKPHOS 73 06/04/2020   AST 21 06/04/2020   ALT 27 06/04/2020   PROT 6.5 06/04/2020   ALBUMIN 4.3 06/04/2020  CALCIUM 9.6 06/04/2020   ANIONGAP 12 07/31/2013   EGFR 67 06/04/2020   Lab Results  Component Value Date   CHOL 120 06/04/2020   Lab Results  Component Value Date   HDL 33 (L) 06/04/2020   Lab Results  Component Value Date   LDLCALC 63 06/04/2020   Lab Results  Component Value Date   TRIG 135 06/04/2020   Lab Results  Component Value Date   CHOLHDL 3.6 06/04/2020   Lab Results  Component Value Date   HGBA1C 5.3 06/04/2020       Assessment & Plan:   Problem List Items Addressed This Visit   None Visit Diagnoses     Acute non-recurrent frontal sinusitis    -  Primary   Relevant Medications   azithromycin (ZITHROMAX) 250 MG tablet      Patient has s/sx consistent with acute sinusitis and ongoing for >5 days with worsening symptoms so will start oral antibiotic therapy with azithromycin. Recommend to continue home supportive care. Follow up if symptoms fail to improve or worsen.  Meds ordered this encounter  Medications   azithromycin (ZITHROMAX) 250 MG tablet    Sig: Take 2 tablets on day 1, then 1 tablet daily on days 2 through 5    Dispense:  6 tablet    Refill:  0    Order Specific Question:   Supervising Provider    Answer:   Beatrice Lecher D [[id number]]      Lorrene Reid, PA-C

## 2020-11-15 ENCOUNTER — Ambulatory Visit (INDEPENDENT_AMBULATORY_CARE_PROVIDER_SITE_OTHER): Payer: Medicare Other | Admitting: Nurse Practitioner

## 2020-11-15 ENCOUNTER — Encounter: Payer: Self-pay | Admitting: Nurse Practitioner

## 2020-11-15 VITALS — BP 135/72 | HR 65 | Temp 98.0°F | Ht 71.0 in | Wt 181.7 lb

## 2020-11-15 DIAGNOSIS — R062 Wheezing: Secondary | ICD-10-CM

## 2020-11-15 DIAGNOSIS — J0141 Acute recurrent pansinusitis: Secondary | ICD-10-CM | POA: Diagnosis not present

## 2020-11-15 MED ORDER — AMOXICILLIN-POT CLAVULANATE 875-125 MG PO TABS: 1.0000 | ORAL_TABLET | Freq: Two times a day (BID) | ORAL | 0 refills | Status: DC

## 2020-11-15 MED ORDER — ALBUTEROL SULFATE HFA 108 (90 BASE) MCG/ACT IN AERS: 2.0000 | INHALATION_SPRAY | Freq: Four times a day (QID) | RESPIRATORY_TRACT | 2 refills | Status: DC | PRN

## 2020-11-15 NOTE — Progress Notes (Signed)
Acute Office Visit  Subjective:    Patient ID: Tommy Hobbs, male    DOB: Jan 21, 1940, 80 y.o.   MRN: [id number]  Chief Complaint  Patient presents with   Cough    The patient was seen in office on 11/08/2020 and was treated with z-pack and  tessalon perls. States that he has not gotten any better, in fact, has gotten worse. Having shortness of breath and worsening cough.   Cough This is a recurrent problem. The current episode started more than 1 month ago. The problem has been gradually worsening. The problem occurs every few minutes. Cough characteristics: patient states when he brings anything up with cough, sputum is clear . Associated symptoms include headaches, nasal congestion, postnasal drip, rhinorrhea, a sore throat, shortness of breath and wheezing. Pertinent negatives include no chest pain, chills, fever, hemoptysis, myalgias or rash. Nothing aggravates the symptoms. He has tried prescription cough suppressant (treated with Zpack) for the symptoms. The treatment provided no relief. His past medical history is significant for environmental allergies.    Past Medical History:  Diagnosis Date   Arthritis    Constipation    DDD (degenerative disc disease)    Dysrhythmia    history svt-clean cath 2004- Had SVT "One Time."   GERD (gastroesophageal reflux disease)    Headache    History of BPH    History of kidney stones    x 1 passed   PTSD (post-traumatic stress disorder)    Wears dentures    full top    Past Surgical History:  Procedure Laterality Date   APPENDECTOMY  1977   CARDIAC CATHETERIZATION  2004   CATARACT EXTRACTION, BILATERAL Bilateral    CERVICAL FUSION  2011   CHOLECYSTECTOMY  1977   open   COLONOSCOPY     INGUINAL HERNIA REPAIR Right 1977   right   INGUINAL HERNIA REPAIR Left 08/04/2013   Procedure: OPEN LEFT INGUINAL HERNIA REPAIR;  Surgeon: Adin Hector, MD;  Location: [place];  Service: General;  Laterality: Left;    INSERTION OF MESH N/A 08/04/2013   Procedure: INSERTION OF MESH;  Surgeon: Adin Hector, MD;  Location: [place];  Service: General;  Laterality: N/A;   KNEE ARTHROSCOPY Left    KYPHOPLASTY N/A 08/17/2014   Procedure: Jone Baseman ONE;  Surgeon: Jovita Gamma, MD;  Location: [facility];  Service: Neurosurgery;  Laterality: N/A;   SUPRAPUBIC PROSTATECTOMY  2012   partial for severe BPH    Family History  Problem Relation Age of Onset   Cancer Brother        kidney   Diabetes Brother     Social History   Socioeconomic History   Marital status: Married    Spouse name: Not on file   Number of children: Not on file   Years of education: Not on file   Highest education level: Not on file  Occupational History   Not on file  Tobacco Use   Smoking status: Former    Packs/day: 1.00    Years: 30.00    Pack years: 30.00    Types: Cigarettes    Start date: 01/09/1953    Quit date: 07/30/2004    Years since quitting: 16.3   Smokeless tobacco: Never  Vaping Use   Vaping Use: Never used  Substance and Sexual Activity   Alcohol use: No   Drug use: No   Sexual activity: Never  Other Topics Concern   Not on  file  Social History Narrative   Not on file   Social Determinants of Health   Financial Resource Strain: Not on file  Food Insecurity: Not on file  Transportation Needs: Not on file  Physical Activity: Not on file  Stress: Not on file  Social Connections: Not on file  Intimate Partner Violence: Not on file    Outpatient Medications Prior to Visit  Medication Sig Dispense Refill   aspirin 81 MG chewable tablet CHEW ONE TABLET BY MOUTH ONCE EVERY DAY     Aspirin-Acetaminophen (GOODYS BODY PAIN PO) Take 1 packet by mouth as needed (pain).     atorvastatin (LIPITOR) 80 MG tablet TAKE 1 TABLET BY MOUTH  DAILY 90 tablet 0   ibuprofen (ADVIL,MOTRIN) 200 MG tablet Take 800 mg by mouth every 8 (eight) hours as needed.     losartan (COZAAR) 50 MG  tablet TAKE 1 TABLET BY MOUTH  DAILY 90 tablet 0   pantoprazole (PROTONIX) 40 MG tablet TAKE 1 TABLET BY MOUTH  DAILY 90 tablet 0   Psyllium (VEGETABLE LAXATIVE PO) Take 1 tablet by mouth daily as needed. [name] Brand     No facility-administered medications prior to visit.    Allergies  Allergen Reactions   Other Hives and Rash    Pt has issues with certain ingredients in lotion.  imidazolidinyl urea, DMDM hydantoin, 2-bromo-2-nitropropane-1, diazolidinyl urea, quaternium 15, cocamidopropyl dimethyl glycine   Azolid [Phenylbutazone]    Diazolidurea-Iodopropbutylcarb    Quanterra [Ginkgo Biloba]    Cocamidopropyl Betaine Hives and Rash   Esomeprazole Magnesium Hives and Rash   Tegopen [Cloxacillin Sodium] Rash    Review of Systems  Constitutional:  Positive for fatigue. Negative for activity change, chills and fever.  HENT:  Positive for congestion, postnasal drip, rhinorrhea and sore throat. Negative for sinus pressure, sinus pain and sneezing.   Eyes: Negative.   Respiratory:  Positive for cough, shortness of breath and wheezing. Negative for hemoptysis.   Cardiovascular:  Negative for chest pain and palpitations.  Gastrointestinal:  Negative for constipation, diarrhea, nausea and vomiting.  Endocrine: Negative for cold intolerance, heat intolerance, polydipsia and polyuria.  Genitourinary:  Negative for dysuria, frequency and urgency.  Musculoskeletal:  Negative for back pain and myalgias.  Skin:  Negative for rash.  Allergic/Immunologic: Positive for environmental allergies.  Neurological:  Positive for headaches. Negative for dizziness and weakness.  Hematological:  Positive for adenopathy.  Psychiatric/Behavioral:  The patient is not nervous/anxious.       Objective:    Physical Exam Vitals and nursing note reviewed.  Constitutional:      Appearance: Normal appearance. He is well-developed. He is ill-appearing.  HENT:     Head: Normocephalic and atraumatic.      Right Ear: Hearing, tympanic membrane, ear canal and external ear normal.     Left Ear: Hearing, tympanic membrane, ear canal and external ear normal.     Nose: Congestion and rhinorrhea present.     Mouth/Throat:     Pharynx: Posterior oropharyngeal erythema present.  Eyes:     Pupils: Pupils are equal, round, and reactive to light.  Cardiovascular:     Rate and Rhythm: Normal rate and regular rhythm.     Pulses: Normal pulses.     Heart sounds: Normal heart sounds.  Pulmonary:     Effort: Pulmonary effort is normal.     Breath sounds: Wheezing present.     Comments: Congested, non productive cough present  Abdominal:     Palpations: Abdomen  is soft.  Musculoskeletal:        General: Normal range of motion.     Cervical back: Normal range of motion and neck supple.  Lymphadenopathy:     Cervical: Cervical adenopathy present.  Skin:    General: Skin is warm and dry.     Capillary Refill: Capillary refill takes less than 2 seconds.  Neurological:     General: No focal deficit present.     Mental Status: He is alert and oriented to person, place, and time.  Psychiatric:        Mood and Affect: Mood normal.        Behavior: Behavior normal.        Thought Content: Thought content normal.        Judgment: Judgment normal.    Today's Vitals   11/15/20 1345 11/15/20 1433  BP: (!) 145/75 135/72  Pulse: 65   Temp: 98 F (36.7 C)   SpO2: 98%   Weight: 181 lb 11.2 oz (82.4 kg)   Height: $Remove'5\' 11"'AWwqigA$  (1.803 m)    Body mass index is 25.34 kg/m.   Wt Readings from Last 3 Encounters:  11/15/20 181 lb 11.2 oz (82.4 kg)  11/08/20 185 lb 3.2 oz (84 kg)  06/08/20 186 lb 8 oz (84.6 kg)    Health Maintenance Due  Topic Date Due   Zoster Vaccines- Shingrix (1 of 2) Never done   Pneumonia Vaccine 38+ Years old (2 - PCV) 12/22/2008   COVID-19 Vaccine (3 - Booster for Pfizer series) 05/14/2019   INFLUENZA VACCINE  08/09/2020    There are no preventive care reminders to display for  this patient.   Lab Results  Component Value Date   TSH 2.670 06/04/2020   Lab Results  Component Value Date   WBC 6.4 06/04/2020   HGB 15.4 06/04/2020   HCT 44.8 06/04/2020   MCV 98 (H) 06/04/2020   PLT 185 06/04/2020   Lab Results  Component Value Date   NA 137 06/04/2020   K 4.5 06/04/2020   CO2 22 06/04/2020   GLUCOSE 84 06/04/2020   BUN 17 06/04/2020   CREATININE 1.12 06/04/2020   BILITOT 0.6 06/04/2020   ALKPHOS 73 06/04/2020   AST 21 06/04/2020   ALT 27 06/04/2020   PROT 6.5 06/04/2020   ALBUMIN 4.3 06/04/2020   CALCIUM 9.6 06/04/2020   ANIONGAP 12 07/31/2013   EGFR 67 06/04/2020   Lab Results  Component Value Date   CHOL 120 06/04/2020   Lab Results  Component Value Date   HDL 33 (L) 06/04/2020   Lab Results  Component Value Date   LDLCALC 63 06/04/2020   Lab Results  Component Value Date   TRIG 135 06/04/2020   Lab Results  Component Value Date   CHOLHDL 3.6 06/04/2020   Lab Results  Component Value Date   HGBA1C 5.3 06/04/2020       Assessment & Plan:  1. Acute recurrent pansinusitis Acute sinusitis, possibly developing into bronchitis or pneumonia. Start augmentin $RemoveBeforeD'875mg'[id number]$  twice daily for next 10 days. Rest and increase fluids. Continue using OTC medication to control symptoms.  Advised him to contact the office in next 2 to 3 days if symptoms worsen or do not improve at all. He voiced understandig and agreeent. - amoxicillin-clavulanate (AUGMENTIN) 875-125 MG tablet; Take 1 tablet by mouth 2 (two) times daily.  Dispense: 20 tablet; Refill: 0  2. Wheezing Add rescue inhaler. Use 2 inhalations up to four times daily as  needed for cough/wheezing.  - albuterol (VENTOLIN HFA) 108 (90 Base) MCG/ACT inhaler; Inhale 2 puffs into the lungs every 6 (six) hours as needed for wheezing or shortness of breath.  Dispense: 1 each; Refill: 2    Problem List Items Addressed This Visit       Respiratory   Acute recurrent pansinusitis - Primary    Relevant Medications   amoxicillin-clavulanate (AUGMENTIN) 875-125 MG tablet     Other   Wheezing   Relevant Medications   albuterol (VENTOLIN HFA) 108 (90 Base) MCG/ACT inhaler     Meds ordered this encounter  Medications   amoxicillin-clavulanate (AUGMENTIN) 875-125 MG tablet    Sig: Take 1 tablet by mouth 2 (two) times daily.    Dispense:  20 tablet    Refill:  0    Patient states that he can take this medication without allergic reaction.    Order Specific Question:   Supervising Provider    Answer:   Beatrice Lecher D [[id number]]   albuterol (VENTOLIN HFA) 108 (90 Base) MCG/ACT inhaler    Sig: Inhale 2 puffs into the lungs every 6 (six) hours as needed for wheezing or shortness of breath.    Dispense:  1 each    Refill:  2    Please fill with alternative preferred by his insurance. Thanks.    Order Specific Question:   Supervising Provider    Answer:   Beatrice Lecher D [[id number]]     Ronnell Freshwater, NP

## 2020-11-16 ENCOUNTER — Other Ambulatory Visit: Payer: Self-pay | Admitting: Physician Assistant

## 2020-11-16 DIAGNOSIS — K219 Gastro-esophageal reflux disease without esophagitis: Secondary | ICD-10-CM

## 2020-11-16 DIAGNOSIS — E7841 Elevated Lipoprotein(a): Secondary | ICD-10-CM

## 2020-11-18 ENCOUNTER — Ambulatory Visit
Admission: RE | Admit: 2020-11-18 | Discharge: 2020-11-18 | Disposition: A | Discharge status: Home / Self Care | Payer: Medicare Other | Source: Ambulatory Visit | Attending: Nurse Practitioner | Admitting: Nurse Practitioner

## 2020-11-18 ENCOUNTER — Other Ambulatory Visit: Payer: Self-pay | Admitting: Physician Assistant

## 2020-11-18 ENCOUNTER — Ambulatory Visit (INDEPENDENT_AMBULATORY_CARE_PROVIDER_SITE_OTHER): Payer: Medicare Other | Admitting: Nurse Practitioner

## 2020-11-18 ENCOUNTER — Ambulatory Visit: Payer: Medicare Other | Admitting: Nurse Practitioner

## 2020-11-18 ENCOUNTER — Other Ambulatory Visit: Payer: Self-pay

## 2020-11-18 ENCOUNTER — Encounter: Payer: Self-pay | Admitting: Nurse Practitioner

## 2020-11-18 VITALS — BP 121/62 | HR 92 | Temp 99.8°F | Ht 71.0 in | Wt 183.0 lb

## 2020-11-18 DIAGNOSIS — R062 Wheezing: Secondary | ICD-10-CM

## 2020-11-18 DIAGNOSIS — J189 Pneumonia, unspecified organism: Secondary | ICD-10-CM

## 2020-11-18 DIAGNOSIS — R059 Cough, unspecified: Secondary | ICD-10-CM

## 2020-11-18 DIAGNOSIS — J0141 Acute recurrent pansinusitis: Secondary | ICD-10-CM | POA: Insufficient documentation

## 2020-11-18 DIAGNOSIS — R0602 Shortness of breath: Secondary | ICD-10-CM

## 2020-11-18 DIAGNOSIS — R079 Chest pain, unspecified: Secondary | ICD-10-CM | POA: Diagnosis not present

## 2020-11-18 MED ORDER — METHYLPREDNISOLONE 4 MG PO TBPK: ORAL_TABLET | ORAL | 0 refills | Status: DC

## 2020-11-18 MED ORDER — CEFTRIAXONE SODIUM 1 G IJ SOLR
1.0000 g | Freq: Once | INTRAMUSCULAR | Status: AC
  Administered 2020-11-18: 1 g via INTRAMUSCULAR

## 2020-11-18 MED ORDER — METHYLPREDNISOLONE SODIUM SUCC 125 MG IJ SOLR
125.0000 mg | Freq: Once | INTRAMUSCULAR | Status: AC
Start: 2020-11-18 — End: 2020-11-18
  Administered 2020-11-18: 125 mg via INTRAMUSCULAR

## 2020-11-18 NOTE — Progress Notes (Signed)
Please let the patient know that chest x-ray did not show pneumonia. No acute abnormalities noted today. Please also ask how he is feeling.  Thanks so much.   -HB

## 2020-11-18 NOTE — Progress Notes (Signed)
Acute Office Visit  Subjective:    Patient ID: Tommy Hobbs, male    DOB: 1940-07-30, 80 y.o.   MRN: [id number]  Chief Complaint  Patient presents with   Shortness of Breath    The patient was seen in office on 11/08/2020 and was treated with z-pack and  tessalon perls. When he did not get better, he was seen again on 11/15/2020. He was having shortness of breath and worsening cough. Was started with augmentin and put on rescue inhaler. Today, he states that he is feeling even worse. Overnight, his shortness of breath was severe. He states that any sort of exertion makes the shortness of breath worse. He feels like the rescue inhaler does not help at all.   Cough This is a new problem. The current episode started 1 to 4 weeks ago. The problem has been gradually worsening. The problem occurs constantly. The cough is Non-productive. Associated symptoms include headaches, nasal congestion, postnasal drip, rhinorrhea, shortness of breath and wheezing. Pertinent negatives include no chest pain, chills, fever, hemoptysis, myalgias, rash or sore throat. He has tried steroid inhaler and OTC cough suppressant (antibiotics) for the symptoms. The treatment provided no relief. There is no history of environmental allergies.    Past Medical History:  Diagnosis Date   Arthritis    Constipation    DDD (degenerative disc disease)    Dysrhythmia    history svt-clean cath 2004- Had SVT "One Time."   GERD (gastroesophageal reflux disease)    Headache    History of BPH    History of kidney stones    x 1 passed   PTSD (post-traumatic stress disorder)    Wears dentures    full top    Past Surgical History:  Procedure Laterality Date   APPENDECTOMY  1977   CARDIAC CATHETERIZATION  2004   CATARACT EXTRACTION, BILATERAL Bilateral    CERVICAL FUSION  2011   CHOLECYSTECTOMY  1977   open   COLONOSCOPY     INGUINAL HERNIA REPAIR Right 1977   right   INGUINAL HERNIA REPAIR Left 08/04/2013    Procedure: OPEN LEFT INGUINAL HERNIA REPAIR;  Surgeon: Adin Hector, MD;  Location: [place];  Service: General;  Laterality: Left;   INSERTION OF MESH N/A 08/04/2013   Procedure: INSERTION OF MESH;  Surgeon: Adin Hector, MD;  Location: [place];  Service: General;  Laterality: N/A;   KNEE ARTHROSCOPY Left    KYPHOPLASTY N/A 08/17/2014   Procedure: Jone Baseman ONE;  Surgeon: Jovita Gamma, MD;  Location: [facility];  Service: Neurosurgery;  Laterality: N/A;   SUPRAPUBIC PROSTATECTOMY  2012   partial for severe BPH    Family History  Problem Relation Age of Onset   Cancer Brother        kidney   Diabetes Brother     Social History   Socioeconomic History   Marital status: Married    Spouse name: Not on file   Number of children: Not on file   Years of education: Not on file   Highest education level: Not on file  Occupational History   Not on file  Tobacco Use   Smoking status: Former    Packs/day: 1.00    Years: 30.00    Pack years: 30.00    Types: Cigarettes    Start date: 01/09/1953    Quit date: 07/30/2004    Years since quitting: 16.3   Smokeless tobacco: Never  Vaping Use  Vaping Use: Never used  Substance and Sexual Activity   Alcohol use: No   Drug use: No   Sexual activity: Never  Other Topics Concern   Not on file  Social History Narrative   Not on file   Social Determinants of Health   Financial Resource Strain: Not on file  Food Insecurity: Not on file  Transportation Needs: Not on file  Physical Activity: Not on file  Stress: Not on file  Social Connections: Not on file  Intimate Partner Violence: Not on file    Outpatient Medications Prior to Visit  Medication Sig Dispense Refill   albuterol (VENTOLIN HFA) 108 (90 Base) MCG/ACT inhaler Inhale 2 puffs into the lungs every 6 (six) hours as needed for wheezing or shortness of breath. 1 each 2   amoxicillin-clavulanate (AUGMENTIN) 875-125 MG tablet  Take 1 tablet by mouth 2 (two) times daily. 20 tablet 0   aspirin 81 MG chewable tablet CHEW ONE TABLET BY MOUTH ONCE EVERY DAY     Aspirin-Acetaminophen (GOODYS BODY PAIN PO) Take 1 packet by mouth as needed (pain).     atorvastatin (LIPITOR) 80 MG tablet TAKE 1 TABLET BY MOUTH  DAILY 90 tablet 0   ibuprofen (ADVIL,MOTRIN) 200 MG tablet Take 800 mg by mouth every 8 (eight) hours as needed.     losartan (COZAAR) 50 MG tablet TAKE 1 TABLET BY MOUTH  DAILY 90 tablet 0   pantoprazole (PROTONIX) 40 MG tablet TAKE 1 TABLET BY MOUTH  DAILY 90 tablet 0   Psyllium (VEGETABLE LAXATIVE PO) Take 1 tablet by mouth daily as needed. [name] Brand     No facility-administered medications prior to visit.    Allergies  Allergen Reactions   Other Hives and Rash    Pt has issues with certain ingredients in lotion.  imidazolidinyl urea, DMDM hydantoin, 2-bromo-2-nitropropane-1, diazolidinyl urea, quaternium 15, cocamidopropyl dimethyl glycine   Azolid [Phenylbutazone]    Diazolidurea-Iodopropbutylcarb    Quanterra [Ginkgo Biloba]    Cocamidopropyl Betaine Hives and Rash   Esomeprazole Magnesium Hives and Rash   Tegopen [Cloxacillin Sodium] Rash    Review of Systems  Constitutional:  Positive for activity change and fatigue. Negative for chills and fever.  HENT:  Positive for congestion, postnasal drip and rhinorrhea. Negative for sinus pressure, sinus pain, sneezing and sore throat.   Eyes: Negative.   Respiratory:  Positive for cough, chest tightness, shortness of breath and wheezing. Negative for hemoptysis.   Cardiovascular:  Negative for chest pain and palpitations.  Gastrointestinal:  Negative for constipation, diarrhea, nausea and vomiting.  Endocrine: Negative for cold intolerance, heat intolerance, polydipsia and polyuria.  Genitourinary:  Negative for dysuria, frequency and urgency.  Musculoskeletal:  Negative for back pain and myalgias.  Skin:  Negative for rash.  Allergic/Immunologic:  Negative for environmental allergies.  Neurological:  Positive for headaches. Negative for dizziness and weakness.  Hematological:  Positive for adenopathy.  Psychiatric/Behavioral:  The patient is not nervous/anxious.       Objective:    Physical Exam Vitals and nursing note reviewed.  Constitutional:      Appearance: Normal appearance. He is well-developed. He is ill-appearing.  HENT:     Head: Normocephalic and atraumatic.     Nose: Congestion and rhinorrhea present.  Eyes:     Extraocular Movements: Extraocular movements intact.     Conjunctiva/sclera: Conjunctivae normal.     Pupils: Pupils are equal, round, and reactive to light.  Cardiovascular:     Rate and Rhythm: Normal  rate and regular rhythm.     Pulses: Normal pulses.     Heart sounds: Normal heart sounds.     Comments: ECG done today is within normal limits  Pulmonary:     Effort: Pulmonary effort is normal.     Breath sounds: Wheezing present.     Comments: There is wheezing heard in the left lower lung. Diminished breath sounds throughout.  Abdominal:     Palpations: Abdomen is soft.  Musculoskeletal:        General: Normal range of motion.     Cervical back: Normal range of motion and neck supple.  Lymphadenopathy:     Cervical: Cervical adenopathy present.  Skin:    General: Skin is warm and dry.     Capillary Refill: Capillary refill takes less than 2 seconds.  Neurological:     General: No focal deficit present.     Mental Status: He is alert and oriented to person, place, and time.  Psychiatric:        Mood and Affect: Mood normal.        Behavior: Behavior normal.        Thought Content: Thought content normal.        Judgment: Judgment normal.    Today's Vitals   11/18/20 1330  BP: 121/62  Pulse: 92  Temp: 99.8 F (37.7 C)  SpO2: 100%  Weight: 183 lb (83 kg)  Height: _0  (1.803 m)   Body mass index is 25.52 kg/m.   Wt Readings from Last 3 Encounters:  11/18/20 183 lb (83 kg)   11/15/20 181 lb 11.2 oz (82.4 kg)  11/08/20 185 lb 3.2 oz (84 kg)    Health Maintenance Due  Topic Date Due   Zoster Vaccines- Shingrix (1 of 2) Never done   Pneumonia Vaccine 39+ Years old (2 - PCV) 12/22/2008   COVID-19 Vaccine (3 - Booster for Pfizer series) 05/14/2019   INFLUENZA VACCINE  08/09/2020    There are no preventive care reminders to display for this patient.   Lab Results  Component Value Date   TSH 2.670 06/04/2020   Lab Results  Component Value Date   WBC 6.4 06/04/2020   HGB 15.4 06/04/2020   HCT 44.8 06/04/2020   MCV 98 (H) 06/04/2020   PLT 185 06/04/2020   Lab Results  Component Value Date   NA 137 06/04/2020   K 4.5 06/04/2020   CO2 22 06/04/2020   GLUCOSE 84 06/04/2020   BUN 17 06/04/2020   CREATININE 1.12 06/04/2020   BILITOT 0.6 06/04/2020   ALKPHOS 73 06/04/2020   AST 21 06/04/2020   ALT 27 06/04/2020   PROT 6.5 06/04/2020   ALBUMIN 4.3 06/04/2020   CALCIUM 9.6 06/04/2020   ANIONGAP 12 07/31/2013   EGFR 67 06/04/2020   Lab Results  Component Value Date   CHOL 120 06/04/2020   Lab Results  Component Value Date   HDL 33 (L) 06/04/2020   Lab Results  Component Value Date   LDLCALC 63 06/04/2020   Lab Results  Component Value Date   TRIG 135 06/04/2020   Lab Results  Component Value Date   CHOLHDL 3.6 06/04/2020   Lab Results  Component Value Date   HGBA1C 5.3 06/04/2020       Assessment & Plan:  1. Community acquired pneumonia, unspecified laterality Patient was given IM injection of Rocephin during today's office visit.  Advised to continue Augmentin twice daily until prescription was gone.  A chest  x-ray has been ordered for further evaluation. Rest and increase fluids. Continue using OTC medication to control symptoms.  He was advised to seek emergency care if shortness of breath worsened, chest pain into, or fever develops.  The patient voiced understanding and agreement with this plan. - cefTRIAXone (ROCEPHIN)  injection 1 g  2. Shortness of breath An EKG was taken during today's visit and showed normal sinus rhythm.  Patient was given a 125 mg Solu-Medrol injection during today's office visit.  He should begin a Medrol taper on Saturday morning.  He should take this as directed for 6 days.  He should continue to use rescue inhaler, 2 inhalations up to 4 times daily as needed for shortness of breath and wheezing. - methylPREDNISolone (MEDROL) 4 MG TBPK tablet; Take by mouth as directed for 6 days  Dispense: 21 tablet; Refill: 0 - methylPREDNISolone sodium succinate (SOLU-MEDROL) 125 mg/2 mL injection 125 mg - EKG 12-Lead   Problem List Items Addressed This Visit       Respiratory   Community acquired pneumonia - Primary     Other   Shortness of breath   Relevant Medications   methylPREDNISolone (MEDROL) 4 MG TBPK tablet   Other Relevant Orders   EKG 12-Lead (Completed)     Meds ordered this encounter  Medications   methylPREDNISolone (MEDROL) 4 MG TBPK tablet    Sig: Take by mouth as directed for 6 days    Dispense:  21 tablet    Refill:  0    Start prescription on 11/20/2020    Order Specific Question:   Supervising Provider    Answer:   Beatrice Lecher D [[id number]]   methylPREDNISolone sodium succinate (SOLU-MEDROL) 125 mg/2 mL injection 125 mg   cefTRIAXone (ROCEPHIN) injection 1 g    Order Specific Question:   Antibiotic Indication:    Answer:   Other Indication (list below)    Order Specific Question:   Other Indication:    Answer:   Pneumonia     Ronnell Freshwater, NP

## 2020-11-18 NOTE — Progress Notes (Signed)
Reviewed ECG with patient during the visit

## 2020-11-19 DIAGNOSIS — J189 Pneumonia, unspecified organism: Secondary | ICD-10-CM | POA: Insufficient documentation

## 2020-11-19 DIAGNOSIS — R0602 Shortness of breath: Secondary | ICD-10-CM | POA: Insufficient documentation

## 2020-11-22 ENCOUNTER — Other Ambulatory Visit: Payer: Self-pay | Admitting: Nurse Practitioner

## 2020-11-22 ENCOUNTER — Telehealth: Payer: Self-pay | Admitting: Nurse Practitioner

## 2020-11-22 DIAGNOSIS — R12 Heartburn: Secondary | ICD-10-CM

## 2020-11-22 MED ORDER — FAMOTIDINE 20 MG PO TABS: 20.0000 mg | ORAL_TABLET | Freq: Two times a day (BID) | ORAL | 1 refills | Status: DC

## 2020-11-22 NOTE — Telephone Encounter (Signed)
Please let the patient know that I sent him some pepcid to take for the indigestion. He can take this up to twicde daily if needed. As for headache, he needs to take tylenol/ibuprofen as needed and per bottle instructions. This may last a litle while due to the severity of illness. He should continue to rest and drink plenty of fluids. He needs some reassurance due to these symptoms may take longer to resolve. Thanks.

## 2020-11-22 NOTE — Telephone Encounter (Signed)
Patient called to let you know he was feeling better however he does still have some indigestion and headache.

## 2020-11-22 NOTE — Telephone Encounter (Signed)
Called pt he is advised to take Pepcid and OTC  medication as needed and follow recommendation

## 2020-12-13 ENCOUNTER — Encounter: Payer: Self-pay | Admitting: Physician Assistant

## 2020-12-13 ENCOUNTER — Ambulatory Visit (INDEPENDENT_AMBULATORY_CARE_PROVIDER_SITE_OTHER): Payer: Medicare Other | Admitting: Physician Assistant

## 2020-12-13 VITALS — BP 112/65 | HR 68 | Temp 97.6°F | Ht 71.0 in | Wt 182.0 lb

## 2020-12-13 DIAGNOSIS — E785 Hyperlipidemia, unspecified: Secondary | ICD-10-CM | POA: Diagnosis not present

## 2020-12-13 DIAGNOSIS — I10 Essential (primary) hypertension: Secondary | ICD-10-CM

## 2020-12-13 DIAGNOSIS — K219 Gastro-esophageal reflux disease without esophagitis: Secondary | ICD-10-CM | POA: Diagnosis not present

## 2020-12-13 NOTE — Assessment & Plan Note (Addendum)
-  Stable. -Continue pantoprazole 40 mg daily and can take famotidine 20 mg BID as needed. -Will continue to monitor.

## 2020-12-13 NOTE — Assessment & Plan Note (Signed)
-  Controlled. -Continue current medication regimen. Last CMP, renal function and electrolytes normal. -Will continue to monitor.

## 2020-12-13 NOTE — Progress Notes (Signed)
Established Patient Office Visit  Subjective:  Patient ID: Tommy Hobbs, male    DOB: 05-10-1940  Age: 80 y.o. MRN: [id number]  CC:  Chief Complaint  Patient presents with   Follow-up   Hypertension   Hyperlipidemia    HPI Ramello Cordial Saunders presents for follow up on hypertension, hyperlipidemia and GERD. Patient has no acute concerns.  HTN: Pt denies chest pain, palpitations, dizziness or lower extremity swelling. Taking medication as directed without side effects. Does not check BP at home. Pt tries to monitor sodium intake and reports good hydration.  HLD: Pt taking medication as directed without issues. Denies side effects including myalgias, muscle weakness or RUQ pain. Reports diet consists of grilled chicken, red meat and vegetables.   GERD: Patient reports compliance with pantoprazole. States not taking famotidine. When needed takes [employer], [employer]. Reports no specific food triggers.    Past Medical History:  Diagnosis Date   Arthritis    Constipation    DDD (degenerative disc disease)    Dysrhythmia    history svt-clean cath 2004- Had SVT "One Time."   GERD (gastroesophageal reflux disease)    Headache    History of BPH    History of kidney stones    x 1 passed   PTSD (post-traumatic stress disorder)    Wears dentures    full top    Past Surgical History:  Procedure Laterality Date   APPENDECTOMY  1977   CARDIAC CATHETERIZATION  2004   CATARACT EXTRACTION, BILATERAL Bilateral    CERVICAL FUSION  2011   CHOLECYSTECTOMY  1977   open   COLONOSCOPY     INGUINAL HERNIA REPAIR Right 1977   right   INGUINAL HERNIA REPAIR Left 08/04/2013   Procedure: OPEN LEFT INGUINAL HERNIA REPAIR;  Surgeon: Adin Hector, MD;  Location: [place];  Service: General;  Laterality: Left;   INSERTION OF MESH N/A 08/04/2013   Procedure: INSERTION OF MESH;  Surgeon: Adin Hector, MD;  Location: [place];  Service: General;  Laterality: N/A;    KNEE ARTHROSCOPY Left    KYPHOPLASTY N/A 08/17/2014   Procedure: Jone Baseman ONE;  Surgeon: Jovita Gamma, MD;  Location: [place] [facility];  Service: Neurosurgery;  Laterality: N/A;   SUPRAPUBIC PROSTATECTOMY  2012   partial for severe BPH    Family History  Problem Relation Age of Onset   Cancer Brother        kidney   Diabetes Brother     Social History   Socioeconomic History   Marital status: Married    Spouse name: Not on file   Number of children: Not on file   Years of education: Not on file   Highest education level: Not on file  Occupational History   Not on file  Tobacco Use   Smoking status: Former    Packs/day: 1.00    Years: 30.00    Pack years: 30.00    Types: Cigarettes    Start date: 01/09/1953    Quit date: 07/30/2004    Years since quitting: 16.3   Smokeless tobacco: Never  Vaping Use   Vaping Use: Never used  Substance and Sexual Activity   Alcohol use: No   Drug use: No   Sexual activity: Never  Other Topics Concern   Not on file  Social History Narrative   Not on file   Social Determinants of Health   Financial Resource Strain: Not on file  Food Insecurity: Not on  file  Transportation Needs: Not on file  Physical Activity: Not on file  Stress: Not on file  Social Connections: Not on file  Intimate Partner Violence: Not on file    Outpatient Medications Prior to Visit  Medication Sig Dispense Refill   albuterol (VENTOLIN HFA) 108 (90 Base) MCG/ACT inhaler Inhale 2 puffs into the lungs every 6 (six) hours as needed for wheezing or shortness of breath. 1 each 2   aspirin 81 MG chewable tablet CHEW ONE TABLET BY MOUTH ONCE EVERY DAY     Aspirin-Acetaminophen (GOODYS BODY PAIN PO) Take 1 packet by mouth as needed (pain).     atorvastatin (LIPITOR) 80 MG tablet TAKE 1 TABLET BY MOUTH  DAILY 90 tablet 0   famotidine (PEPCID) 20 MG tablet Take 1 tablet (20 mg total) by mouth 2 (two) times daily. 45 tablet 1   ibuprofen (ADVIL,MOTRIN) 200 MG  tablet Take 800 mg by mouth every 8 (eight) hours as needed.     losartan (COZAAR) 50 MG tablet TAKE 1 TABLET BY MOUTH  DAILY 90 tablet 0   pantoprazole (PROTONIX) 40 MG tablet TAKE 1 TABLET BY MOUTH  DAILY 90 tablet 0   Psyllium (VEGETABLE LAXATIVE PO) Take 1 tablet by mouth daily as needed. [name] Brand     amoxicillin-clavulanate (AUGMENTIN) 875-125 MG tablet Take 1 tablet by mouth 2 (two) times daily. 20 tablet 0   methylPREDNISolone (MEDROL) 4 MG TBPK tablet Take by mouth as directed for 6 days 21 tablet 0   No facility-administered medications prior to visit.    Allergies  Allergen Reactions   Other Hives and Rash    Pt has issues with certain ingredients in lotion.  imidazolidinyl urea, DMDM hydantoin, 2-bromo-2-nitropropane-1, diazolidinyl urea, quaternium 15, cocamidopropyl dimethyl glycine   Azolid [Phenylbutazone]    Diazolidurea-Iodopropbutylcarb    Quanterra [Ginkgo Biloba]    Cocamidopropyl Betaine Hives and Rash   Esomeprazole Magnesium Hives and Rash   Tegopen [Cloxacillin Sodium] Rash    ROS Review of Systems A fourteen system review of systems was performed and found to be positive as per HPI.   Objective:    Physical Exam General:  Well Developed, well nourished, appropriate for stated age.  Neuro:  Alert and oriented,  extra-ocular muscles intact  HEENT:  Normocephalic, atraumatic, neck supple, no carotid bruits appreciated  Skin:  no gross rash, warm, pink. Cardiac:  RRR, S1 S2 Respiratory: CTA B/L, Not using accessory muscles, speaking in full sentences- unlabored. Vascular:  Ext warm, no cyanosis apprec.; cap RF less 2 sec. No edema  Psych:  No HI/SI, judgement and insight good, Euthymic mood. Full Affect.  BP 112/65   Pulse 68   Temp 97.6 F (36.4 C)   Ht '5\' 11"'  (1.803 m)   Wt 182 lb (82.6 kg)   SpO2 98%   BMI 25.38 kg/m  Wt Readings from Last 3 Encounters:  12/13/20 182 lb (82.6 kg)  11/18/20 183 lb (83 kg)  11/15/20 181 lb 11.2 oz (82.4  kg)     Health Maintenance Due  Topic Date Due   Zoster Vaccines- Shingrix (1 of 2) Never done   Pneumonia Vaccine 35+ Years old (2 - PCV) 12/22/2008   COVID-19 Vaccine (3 - Booster for Pfizer series) 05/14/2019   INFLUENZA VACCINE  08/09/2020    There are no preventive care reminders to display for this patient.  Lab Results  Component Value Date   TSH 2.670 06/04/2020   Lab Results  Component Value  Date   WBC 6.4 06/04/2020   HGB 15.4 06/04/2020   HCT 44.8 06/04/2020   MCV 98 (H) 06/04/2020   PLT 185 06/04/2020   Lab Results  Component Value Date   NA 137 06/04/2020   K 4.5 06/04/2020   CO2 22 06/04/2020   GLUCOSE 84 06/04/2020   BUN 17 06/04/2020   CREATININE 1.12 06/04/2020   BILITOT 0.6 06/04/2020   ALKPHOS 73 06/04/2020   AST 21 06/04/2020   ALT 27 06/04/2020   PROT 6.5 06/04/2020   ALBUMIN 4.3 06/04/2020   CALCIUM 9.6 06/04/2020   ANIONGAP 12 07/31/2013   EGFR 67 06/04/2020   Lab Results  Component Value Date   CHOL 120 06/04/2020   Lab Results  Component Value Date   HDL 33 (L) 06/04/2020   Lab Results  Component Value Date   LDLCALC 63 06/04/2020   Lab Results  Component Value Date   TRIG 135 06/04/2020   Lab Results  Component Value Date   CHOLHDL 3.6 06/04/2020   Lab Results  Component Value Date   HGBA1C 5.3 06/04/2020      Assessment & Plan:   Problem List Items Addressed This Visit       Cardiovascular and Mediastinum   Essential hypertension - Primary    -Controlled. -Continue current medication regimen. Last CMP, renal function and electrolytes normal. -Will continue to monitor.        Digestive   GERD (gastroesophageal reflux disease) (Chronic)    -Stable. -Continue pantoprazole 40 mg daily and can take famotidine 20 mg BID as needed. -Will continue to monitor.        Other   Hyperlipidemia    -Stable. -Continue current medication regimen. -Discussed low fat diet. -Will repeat lipid panel and hepatic  function with [facility].       No orders of the defined types were placed in this encounter.   Follow-up: Return in about 6 months (around 06/13/2021) for [facility] and [facility] a week prior .    Lorrene Reid, PA-C

## 2020-12-13 NOTE — Assessment & Plan Note (Signed)
-  Stable. -Continue current medication regimen. -Discussed low fat diet. -Will repeat lipid panel and hepatic function with [facility].

## 2020-12-27 ENCOUNTER — Other Ambulatory Visit: Payer: Self-pay | Admitting: Physician Assistant

## 2020-12-27 DIAGNOSIS — I10 Essential (primary) hypertension: Secondary | ICD-10-CM

## 2021-02-10 ENCOUNTER — Other Ambulatory Visit: Payer: Self-pay | Admitting: Physician Assistant

## 2021-02-10 DIAGNOSIS — E7841 Elevated Lipoprotein(a): Secondary | ICD-10-CM

## 2021-04-29 ENCOUNTER — Other Ambulatory Visit: Payer: Self-pay | Admitting: Physician Assistant

## 2021-04-29 DIAGNOSIS — K219 Gastro-esophageal reflux disease without esophagitis: Secondary | ICD-10-CM

## 2021-04-29 DIAGNOSIS — I10 Essential (primary) hypertension: Secondary | ICD-10-CM

## 2021-05-04 ENCOUNTER — Other Ambulatory Visit: Payer: Self-pay

## 2021-05-04 DIAGNOSIS — K219 Gastro-esophageal reflux disease without esophagitis: Secondary | ICD-10-CM

## 2021-05-04 MED ORDER — PANTOPRAZOLE SODIUM 40 MG PO TBEC: 40.0000 mg | DELAYED_RELEASE_TABLET | Freq: Every day | ORAL | 1 refills | Status: DC

## 2021-06-01 DIAGNOSIS — M1711 Unilateral primary osteoarthritis, right knee: Secondary | ICD-10-CM | POA: Diagnosis not present

## 2021-06-01 DIAGNOSIS — M1712 Unilateral primary osteoarthritis, left knee: Secondary | ICD-10-CM | POA: Diagnosis not present

## 2021-06-01 DIAGNOSIS — M170 Bilateral primary osteoarthritis of knee: Secondary | ICD-10-CM

## 2021-06-01 DIAGNOSIS — G8929 Other chronic pain: Secondary | ICD-10-CM | POA: Diagnosis not present

## 2021-06-02 ENCOUNTER — Telehealth: Payer: Self-pay

## 2021-06-02 ENCOUNTER — Ambulatory Visit: Payer: Medicare Other | Admitting: Orthopedic Surgery

## 2021-06-02 ENCOUNTER — Ambulatory Visit (INDEPENDENT_AMBULATORY_CARE_PROVIDER_SITE_OTHER): Payer: Medicare Other

## 2021-06-02 ENCOUNTER — Encounter: Payer: Self-pay | Admitting: Orthopedic Surgery

## 2021-06-02 ENCOUNTER — Ambulatory Visit: Payer: Self-pay

## 2021-06-02 VITALS — Ht 71.0 in | Wt 182.0 lb

## 2021-06-02 DIAGNOSIS — G8929 Other chronic pain: Secondary | ICD-10-CM | POA: Diagnosis not present

## 2021-06-02 DIAGNOSIS — M25562 Pain in left knee: Secondary | ICD-10-CM

## 2021-06-02 DIAGNOSIS — M25561 Pain in right knee: Secondary | ICD-10-CM | POA: Diagnosis not present

## 2021-06-02 DIAGNOSIS — M170 Bilateral primary osteoarthritis of knee: Secondary | ICD-10-CM

## 2021-06-02 DIAGNOSIS — M1711 Unilateral primary osteoarthritis, right knee: Secondary | ICD-10-CM | POA: Diagnosis not present

## 2021-06-02 MED ORDER — BUPIVACAINE HCL 0.25 % IJ SOLN
4.0000 mL | INTRAMUSCULAR | Status: AC | PRN
  Administered 2021-06-01: 4 mL via INTRA_ARTICULAR

## 2021-06-02 MED ORDER — LIDOCAINE HCL 1 % IJ SOLN
5.0000 mL | INTRAMUSCULAR | Status: AC | PRN
  Administered 2021-06-01: 5 mL

## 2021-06-02 MED ORDER — METHYLPREDNISOLONE ACETATE 40 MG/ML IJ SUSP
40.0000 mg | INTRAMUSCULAR | Status: AC | PRN
  Administered 2021-06-01: 40 mg via INTRA_ARTICULAR

## 2021-06-02 NOTE — Telephone Encounter (Signed)
Bilateral gel knee injections

## 2021-06-02 NOTE — Progress Notes (Signed)
Office Visit Note   Patient: Tommy Hobbs           Date of Birth: 1940/11/09           MRN: [id number] Visit Date: 06/02/2021 Requested by: Tommy Reid, PA-C [place],  [place] [id number] PCP: Tommy Reid, PA-C  Subjective: Chief Complaint  Patient presents with   Left Knee - Pain   Right Knee - Pain    HPI: Tommy Hobbs is a 81 year old patient with bilateral knee pain.  Has a history of left knee arthroscopy 20 years ago.  Denies any swelling or injury.  States that steps are difficult.  Has not had any right knee surgery.  Denies any back pain with radiation locking or groin symptoms.  Takes ibuprofen without much help.  Does describe diminished standing and walking endurance as well as occasional night pain.  The pain does wake him from sleep at night.              ROS: All systems reviewed are negative as they relate to the chief complaint within the history of present illness.  Patient denies  fevers or chills.   Assessment & Plan: Visit Diagnoses:  1. Chronic pain of both knees     Plan: Impression is bilateral knee arthritis.  Plan is bilateral knee cortisone injections today with gel injections in 6 weeks.  Patient wants to avoid surgical intervention if at all possible.  Overall he is done well with knee arthritis for many years.  I think these injections could diminish his pain enough to make it more livable for him.  We will see him back in 6 weeks for bilateral gel injections.  Follow-Up Instructions: Return in about 6 weeks (around 07/14/2021).   Orders:  Orders Placed This Encounter  Procedures   XR KNEE 3 VIEW LEFT   XR KNEE 3 VIEW RIGHT   No orders of the defined types were placed in this encounter.     Procedures: Large Joint Inj: R knee on 06/01/2021 10:17 AM Indications: diagnostic evaluation, joint swelling and pain Details: 18 G 1.5 in needle, superolateral approach  Arthrogram: No  Medications: 5 mL lidocaine 1 %; 40 mg  methylPREDNISolone acetate 40 MG/ML; 4 mL bupivacaine 0.25 % Outcome: tolerated well, no immediate complications Procedure, treatment alternatives, risks and benefits explained, specific risks discussed. Consent was given by the patient. Immediately prior to procedure a time out was called to verify the correct patient, procedure, equipment, support staff and site/side marked as required. Patient was prepped and draped in the usual sterile fashion.    Large Joint Inj: L knee on 06/01/2021 10:18 AM Indications: diagnostic evaluation, joint swelling and pain Details: 18 G 1.5 in needle, superolateral approach  Arthrogram: No  Medications: 5 mL lidocaine 1 %; 40 mg methylPREDNISolone acetate 40 MG/ML; 4 mL bupivacaine 0.25 % Outcome: tolerated well, no immediate complications Procedure, treatment alternatives, risks and benefits explained, specific risks discussed. Consent was given by the patient. Immediately prior to procedure a time out was called to verify the correct patient, procedure, equipment, support staff and site/side marked as required. Patient was prepped and draped in the usual sterile fashion.      Clinical Data: No additional findings.  Objective: Vital Signs: Ht '5\' 11"'$  (1.803 m)   Wt 182 lb (82.6 kg)   BMI 25.38 kg/m   Physical Exam:   Constitutional: Patient appears well-developed HEENT:  Head: Normocephalic Eyes:EOM are normal Neck: Normal range  of motion Cardiovascular: Normal rate Pulmonary/chest: Effort normal Neurologic: Patient is alert Skin: Skin is warm Psychiatric: Patient has normal mood and affect   Ortho Exam: Ortho exam demonstrates slight varus alignment bilateral lower extremities.  No groin pain with internal/external Tatian of the leg.  No masses lymphadenopathy or skin changes noted in either knee region.  Extensor mechanism intact bilaterally.  Range of motion is about 5-1 15 both knees.  Collateral and cruciate ligaments are  stable.  Specialty Comments:  No specialty comments available.  Imaging: XR KNEE 3 VIEW LEFT  Result Date: 06/02/2021 AP lateral merchant radiographs left knee reviewed.  Severe tricompartmental arthritis is present worse in the medial and patellofemoral compartments.  Slight varus alignment present.  No acute fracture  XR KNEE 3 VIEW RIGHT  Result Date: 06/02/2021 AP lateral merchant radiographs right knee reviewed.  Severe tricompartmental arthritis is present worse in the medial and patellofemoral compartments.  Slight varus alignment present.  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   Low HDL (under 40) 05/02/2017   Acute maxillary sinusitis 01/30/2017   Cough in adult 01/30/2017   GERD (gastroesophageal reflux disease) 01/25/2017   h/o Dysrhythmia 01/25/2017   Family history of diabetes mellitus in brother 01/25/2017   Family history of renal cancer- brother 01/25/2017   History of BPH 01/25/2017   DDD (degenerative disc disease) 01/25/2017   h/o PTSD (post-traumatic stress disorder)- served in [place] 01/25/2017   h/o Compression fracture of L1 lumbar vertebra ([place]) 08/17/2014   Left inguinal hernia 07/17/2013   Past Medical History:  Diagnosis Date   Arthritis    Constipation    DDD (degenerative disc disease)    Dysrhythmia    history svt-clean cath 2004- Had SVT "One Time."   GERD (gastroesophageal reflux disease)    Headache    History of BPH    History of kidney stones    x 1 passed   PTSD (post-traumatic stress  disorder)    Wears dentures    full top    Family History  Problem Relation Age of Onset   Cancer Brother        kidney   Diabetes Brother     Past Surgical History:  Procedure Laterality Date   APPENDECTOMY  1977   CARDIAC CATHETERIZATION  2004   CATARACT EXTRACTION, BILATERAL Bilateral    CERVICAL FUSION  2011   CHOLECYSTECTOMY  1977   open   COLONOSCOPY     INGUINAL HERNIA REPAIR Right 1977   right   INGUINAL HERNIA REPAIR Left 08/04/2013   Procedure: OPEN LEFT INGUINAL HERNIA REPAIR;  Surgeon: Adin Hector, MD;  Location: [facility];  Service: General;  Laterality: Left;   INSERTION OF MESH N/A 08/04/2013   Procedure: INSERTION OF MESH;  Surgeon: Adin Hector, MD;  Location: [facility];  Service: General;  Laterality: N/A;   KNEE ARTHROSCOPY Left    KYPHOPLASTY N/A 08/17/2014   Procedure: Jone Baseman ONE;  Surgeon: Jovita Gamma, MD;  Location: [facility];  Service: Neurosurgery;  Laterality: N/A;  SUPRAPUBIC PROSTATECTOMY  2012   partial for severe BPH   Social History   Occupational History   Not on file  Tobacco Use   Smoking status: Former    Packs/day: 1.00    Years: 30.00    Pack years: 30.00    Types: Cigarettes    Start date: 01/09/1953    Quit date: 07/30/2004    Years since quitting: 16.8   Smokeless tobacco: Never  Vaping Use   Vaping Use: Never used  Substance and Sexual Activity   Alcohol use: No   Drug use: No   Sexual activity: Never

## 2021-06-08 ENCOUNTER — Other Ambulatory Visit: Payer: Self-pay | Admitting: Physician Assistant

## 2021-06-08 DIAGNOSIS — I10 Essential (primary) hypertension: Secondary | ICD-10-CM

## 2021-06-08 DIAGNOSIS — I499 Cardiac arrhythmia, unspecified: Secondary | ICD-10-CM

## 2021-06-08 DIAGNOSIS — Z0000 Encounter for general adult medical examination without abnormal findings: Secondary | ICD-10-CM

## 2021-06-08 DIAGNOSIS — E7841 Elevated Lipoprotein(a): Secondary | ICD-10-CM

## 2021-06-08 DIAGNOSIS — Z833 Family history of diabetes mellitus: Secondary | ICD-10-CM

## 2021-06-08 DIAGNOSIS — E785 Hyperlipidemia, unspecified: Secondary | ICD-10-CM

## 2021-06-09 ENCOUNTER — Other Ambulatory Visit: Payer: Medicare Other

## 2021-06-09 DIAGNOSIS — E785 Hyperlipidemia, unspecified: Secondary | ICD-10-CM

## 2021-06-09 DIAGNOSIS — E7841 Elevated Lipoprotein(a): Secondary | ICD-10-CM

## 2021-06-09 DIAGNOSIS — Z0000 Encounter for general adult medical examination without abnormal findings: Secondary | ICD-10-CM

## 2021-06-09 DIAGNOSIS — I499 Cardiac arrhythmia, unspecified: Secondary | ICD-10-CM | POA: Diagnosis not present

## 2021-06-09 DIAGNOSIS — I10 Essential (primary) hypertension: Secondary | ICD-10-CM | POA: Diagnosis not present

## 2021-06-09 DIAGNOSIS — Z833 Family history of diabetes mellitus: Secondary | ICD-10-CM

## 2021-06-10 LAB — CBC
Hematocrit: 47.6 % (ref 37.5–51.0)
Hemoglobin: 16.2 g/dL (ref 13.0–17.7)
MCH: 33.5 pg — ABNORMAL HIGH (ref 26.6–33.0)
MCHC: 34 g/dL (ref 31.5–35.7)
MCV: 99 fL — ABNORMAL HIGH (ref 79–97)
Platelets: 205 10*3/uL (ref 150–450)
RBC: 4.83 x10E6/uL (ref 4.14–5.80)
RDW: 12.2 % (ref 11.6–15.4)
WBC: 7.1 10*3/uL (ref 3.4–10.8)

## 2021-06-10 LAB — COMPREHENSIVE METABOLIC PANEL
ALT: 33 IU/L (ref 0–44)
AST: 26 IU/L (ref 0–40)
Albumin/Globulin Ratio: 2.3 — ABNORMAL HIGH (ref 1.2–2.2)
Albumin: 4.5 g/dL (ref 3.7–4.7)
Alkaline Phosphatase: 86 IU/L (ref 44–121)
BUN/Creatinine Ratio: 16 (ref 10–24)
BUN: 17 mg/dL (ref 8–27)
Bilirubin Total: 0.7 mg/dL (ref 0.0–1.2)
CO2: 24 mmol/L (ref 20–29)
Calcium: 9.5 mg/dL (ref 8.6–10.2)
Chloride: 100 mmol/L (ref 96–106)
Creatinine, Ser: 1.08 mg/dL (ref 0.76–1.27)
Globulin, Total: 2 g/dL (ref 1.5–4.5)
Glucose: 90 mg/dL (ref 70–99)
Potassium: 4.7 mmol/L (ref 3.5–5.2)
Sodium: 135 mmol/L (ref 134–144)
Total Protein: 6.5 g/dL (ref 6.0–8.5)
eGFR: 70 mL/min/{1.73_m2} (ref >59)

## 2021-06-10 LAB — LIPID PANEL
Chol/HDL Ratio: 3.2 ratio (ref 0.0–5.0)
Cholesterol, Total: 127 mg/dL (ref 100–199)
HDL: 40 mg/dL (ref >39)
LDL Chol Calc (NIH): 68 mg/dL (ref 0–99)
Triglycerides: 104 mg/dL (ref 0–149)
VLDL Cholesterol Cal: 19 mg/dL (ref 5–40)

## 2021-06-10 LAB — TSH: TSH: 2.35 u[IU]/mL (ref 0.450–4.500)

## 2021-06-10 LAB — HEMOGLOBIN A1C
Est. average glucose Bld gHb Est-mCnc: 108 mg/dL
Hgb A1c MFr Bld: 5.4 % (ref 4.8–5.6)

## 2021-06-15 NOTE — Patient Instructions (Signed)
Preventive Care 65 Years and Older, Male Preventive care refers to lifestyle choices and visits with your health care provider that can promote health and wellness. Preventive care visits are also called wellness exams. What can I expect for my preventive care visit? Counseling During your preventive care visit, your health care provider may ask about your: Medical history, including: Past medical problems. Family medical history. History of falls. Current health, including: Emotional well-being. Home life and relationship well-being. Sexual activity. Memory and ability to understand (cognition). Lifestyle, including: Alcohol, nicotine or tobacco, and drug use. Access to firearms. Diet, exercise, and sleep habits. Work and work environment. Sunscreen use. Safety issues such as seatbelt and bike helmet use. Physical exam Your health care provider will check your: Height and weight. These may be used to calculate your BMI (body mass index). BMI is a measurement that tells if you are at a healthy weight. Waist circumference. This measures the distance around your waistline. This measurement also tells if you are at a healthy weight and may help predict your risk of certain diseases, such as type 2 diabetes and high blood pressure. Heart rate and blood pressure. Body temperature. Skin for abnormal spots. What immunizations do I need?  Vaccines are usually given at various ages, according to a schedule. Your health care provider will recommend vaccines for you based on your age, medical history, and lifestyle or other factors, such as travel or where you work. What tests do I need? Screening Your health care provider may recommend screening tests for certain conditions. This may include: Lipid and cholesterol levels. Diabetes screening. This is done by checking your blood sugar (glucose) after you have not eaten for a while (fasting). Hepatitis C test. Hepatitis B test. HIV (human  immunodeficiency virus) test. STI (sexually transmitted infection) testing, if you are at risk. Lung cancer screening. Colorectal cancer screening. Prostate cancer screening. Abdominal aortic aneurysm (AAA) screening. You may need this if you are a current or former smoker. Talk with your health care provider about your test results, treatment options, and if necessary, the need for more tests. Follow these instructions at home: Eating and drinking  Eat a diet that includes fresh fruits and vegetables, whole grains, lean protein, and low-fat dairy products. Limit your intake of foods with high amounts of sugar, saturated fats, and salt. Take vitamin and mineral supplements as recommended by your health care provider. Do not drink alcohol if your health care provider tells you not to drink. If you drink alcohol: Limit how much you have to 0-2 drinks a day. Know how much alcohol is in your drink. In the [place] one drink equals one 12 oz bottle of beer (355 mL), one 5 oz glass of wine (148 mL), or one 1 oz glass of hard liquor (44 mL). Lifestyle Brush your teeth every morning and night with fluoride toothpaste. Floss one time each day. Exercise for at least 30 minutes 5 or more days each week. Do not use any products that contain nicotine or tobacco. These products include cigarettes, chewing tobacco, and vaping devices, such as e-cigarettes. If you need help quitting, ask your health care provider. Do not use drugs. If you are sexually active, practice safe sex. Use a condom or other form of protection to prevent STIs. Take aspirin only as told by your health care provider. Make sure that you understand how much to take and what form to take. Work with your health care provider to find out whether it is safe   and beneficial for you to take aspirin daily. Ask your health care provider if you need to take a cholesterol-lowering medicine (statin). Find healthy ways to manage stress, such  as: Meditation, yoga, or listening to music. Journaling. Talking to a trusted person. Spending time with friends and family. Safety Always wear your seat belt while driving or riding in a vehicle. Do not drive: If you have been drinking alcohol. Do not ride with someone who has been drinking. When you are tired or distracted. While texting. If you have been using any mind-altering substances or drugs. Wear a helmet and other protective equipment during sports activities. If you have firearms in your house, make sure you follow all gun safety procedures. Minimize exposure to UV radiation to reduce your risk of skin cancer. What's next? Visit your health care provider once a year for an annual wellness visit. Ask your health care provider how often you should have your eyes and teeth checked. Stay up to date on all vaccines. This information is not intended to replace advice given to you by your health care provider. Make sure you discuss any questions you have with your health care provider. Document Revised: 06/23/2020 Document Reviewed: 06/23/2020 Elsevier Patient Education  2023 Elsevier Inc.  

## 2021-06-16 ENCOUNTER — Ambulatory Visit (INDEPENDENT_AMBULATORY_CARE_PROVIDER_SITE_OTHER): Payer: Medicare Other | Admitting: Physician Assistant

## 2021-06-16 ENCOUNTER — Encounter: Payer: Self-pay | Admitting: Physician Assistant

## 2021-06-16 VITALS — Ht 71.0 in | Wt 182.0 lb

## 2021-06-16 DIAGNOSIS — N400 Enlarged prostate without lower urinary tract symptoms: Secondary | ICD-10-CM | POA: Insufficient documentation

## 2021-06-16 DIAGNOSIS — R030 Elevated blood-pressure reading, without diagnosis of hypertension: Secondary | ICD-10-CM | POA: Insufficient documentation

## 2021-06-16 DIAGNOSIS — M1990 Unspecified osteoarthritis, unspecified site: Secondary | ICD-10-CM | POA: Insufficient documentation

## 2021-06-16 DIAGNOSIS — R972 Elevated prostate specific antigen [PSA]: Secondary | ICD-10-CM | POA: Insufficient documentation

## 2021-06-16 DIAGNOSIS — E559 Vitamin D deficiency, unspecified: Secondary | ICD-10-CM | POA: Insufficient documentation

## 2021-06-16 DIAGNOSIS — R519 Headache, unspecified: Secondary | ICD-10-CM | POA: Insufficient documentation

## 2021-06-16 DIAGNOSIS — N411 Chronic prostatitis: Secondary | ICD-10-CM | POA: Insufficient documentation

## 2021-06-16 DIAGNOSIS — Z0000 Encounter for general adult medical examination without abnormal findings: Secondary | ICD-10-CM | POA: Diagnosis not present

## 2021-06-16 DIAGNOSIS — R350 Frequency of micturition: Secondary | ICD-10-CM | POA: Insufficient documentation

## 2021-06-16 DIAGNOSIS — D126 Benign neoplasm of colon, unspecified: Secondary | ICD-10-CM | POA: Insufficient documentation

## 2021-06-16 DIAGNOSIS — K439 Ventral hernia without obstruction or gangrene: Secondary | ICD-10-CM | POA: Insufficient documentation

## 2021-06-16 NOTE — Progress Notes (Signed)
Subjective:   Tommy Hobbs is a 81 y.o. male who presents for Medicare Annual/Subsequent preventive examination.  Review of Systems    Refer to PCP  I connected with  Tommy Hobbs on 06/16/21 by an audio only telemedicine application and verified that I am speaking with the correct person using two identifiers.   I discussed the limitations, risks, security and privacy concerns of performing an evaluation and management service by telephone and the availability of in person appointments. I also discussed with the patient that there may be a patient responsible charge related to this service. The patient expressed understanding and verbally consented to this telephonic visit.  Location of Patient: Home Location of Provider: Office  List any persons and their role that are participating in the visit with the patient.       Objective:    Today's Vitals   06/16/21 0936  Weight: 182 lb (82.6 kg)  Height: '5\' 11"'$  (1.803 m)   Body mass index is 25.38 kg/m.     01/25/2017    4:01 PM 08/13/2014   10:24 AM 08/04/2013    9:36 AM 07/30/2013   10:58 AM  Advanced Directives  Does Patient Have a Medical Advance Directive? No No Patient does not have advance directive Patient does not have advance directive;Patient would not like information  Would patient like information on creating a medical advance directive? Yes (MAU/Ambulatory/Procedural Areas - Information given) Yes - Educational materials given      Current Medications (verified) Outpatient Encounter Medications as of 06/16/2021  Medication Sig   albuterol (VENTOLIN HFA) 108 (90 Base) MCG/ACT inhaler Inhale 2 puffs into the lungs every 6 (six) hours as needed for wheezing or shortness of breath.   Aspirin-Acetaminophen (GOODYS BODY PAIN PO) Take 1 packet by mouth as needed (pain).   atorvastatin (LIPITOR) 80 MG tablet TAKE 1 TABLET BY MOUTH  DAILY   famotidine (PEPCID) 20 MG tablet Take 1 tablet (20 mg total) by mouth 2 (two)  times daily.   losartan (COZAAR) 50 MG tablet TAKE 1 TABLET BY MOUTH DAILY   pantoprazole (PROTONIX) 40 MG tablet Take 1 tablet (40 mg total) by mouth daily.   Psyllium (VEGETABLE LAXATIVE PO) Take 1 tablet by mouth daily as needed. [name] Brand   [DISCONTINUED] aspirin 81 MG chewable tablet CHEW ONE TABLET BY MOUTH ONCE EVERY DAY (Patient not taking: Reported on 06/16/2021)   [DISCONTINUED] ibuprofen (ADVIL,MOTRIN) 200 MG tablet Take 800 mg by mouth every 8 (eight) hours as needed. (Patient not taking: Reported on 06/16/2021)   No facility-administered encounter medications on file as of 06/16/2021.    Allergies (verified) Other, Azolid [phenylbutazone], Diazolidurea-iodopropbutylcarb, Derrill Kay [ginkgo biloba], Cocamidopropyl betaine, Esomeprazole magnesium, and Tegopen [cloxacillin sodium]   History: Past Medical History:  Diagnosis Date   Arthritis    Constipation    DDD (degenerative disc disease)    Dysrhythmia    history svt-clean cath 2004- Had SVT "One Time."   GERD (gastroesophageal reflux disease)    Headache    History of BPH    History of kidney stones    x 1 passed   PTSD (post-traumatic stress disorder)    Wears dentures    full top   Past Surgical History:  Procedure Laterality Date   APPENDECTOMY  1977   CARDIAC CATHETERIZATION  2004   CATARACT EXTRACTION, BILATERAL Bilateral    CERVICAL FUSION  2011   CHOLECYSTECTOMY  1977   open   COLONOSCOPY  INGUINAL HERNIA REPAIR Right 1977   right   INGUINAL HERNIA REPAIR Left 08/04/2013   Procedure: OPEN LEFT INGUINAL HERNIA REPAIR;  Surgeon: Adin Hector, MD;  Location: [facility];  Service: General;  Laterality: Left;   INSERTION OF MESH N/A 08/04/2013   Procedure: INSERTION OF MESH;  Surgeon: Adin Hector, MD;  Location: [facility];  Service: General;  Laterality: N/A;   KNEE ARTHROSCOPY Left    KYPHOPLASTY N/A 08/17/2014   Procedure: Jone Baseman ONE;  Surgeon: Jovita Gamma, MD;  Location: [facility];  Service: Neurosurgery;  Laterality: N/A;   SUPRAPUBIC PROSTATECTOMY  2012   partial for severe BPH   Family History  Problem Relation Age of Onset   Cancer Brother        kidney   Diabetes Brother    Social History   Socioeconomic History   Marital status: Married    Spouse name: Not on file   Number of children: Not on file   Years of education: Not on file   Highest education level: Not on file  Occupational History   Not on file  Tobacco Use   Smoking status: Former    Packs/day: 1.00    Years: 30.00    Total pack years: 30.00    Types: Cigarettes    Start date: 01/09/1953    Quit date: 07/30/2004    Years since quitting: 16.8   Smokeless tobacco: Never  Vaping Use   Vaping Use: Never used  Substance and Sexual Activity   Alcohol use: No   Drug use: No   Sexual activity: Never  Other Topics Concern   Not on file  Social History Narrative   Not on file   Social Determinants of Health   Financial Resource Strain: Not on file  Food Insecurity: Not on file  Transportation Needs: Not on file  Physical Activity: Not on file  Stress: Not on file  Social Connections: Not on file    Tobacco Counseling Counseling given: Not Answered   Clinical Intake:                 Diabetic?No         Activities of Daily Living    06/16/2021    9:38 AM 12/13/2020   10:28 AM  In your present state of health, do you have any difficulty performing the following activities:  Hearing? 0 0  Vision? 0 0  Difficulty concentrating or making decisions? 0 0  Walking or climbing stairs? 0 0  Dressing or bathing? 0 0  Doing errands, shopping? 0 0    Patient Care Team: Lorrene Reid, PA-C as PCP - General Irine Seal, MD as Attending Physician (Urology) Melida Quitter, MD as Consulting Physician (Otolaryngology)  Indicate any recent Medical Services you may have received from other than [facility] providers in the past year (date  may be approximate).     Assessment:   This is a routine wellness examination for Tommy Hobbs.  Hearing/Vision screen No results found.  Dietary issues and exercise activities discussed:     Goals Addressed   None   Depression Screen    06/16/2021    9:38 AM 12/13/2020   10:28 AM 11/08/2020   11:19 AM 06/08/2020    9:08 AM 02/09/2020   10:44 AM 04/03/2019    1:33 PM 02/25/2019    3:04 PM  PHQ 2/9 Scores  PHQ - 2 Score 0 0 0 0 0 0 0  PHQ-  9 Score 0 0 0 0 0 1 0    Fall Risk    06/16/2021    9:38 AM 12/13/2020   10:27 AM 11/08/2020   11:19 AM 06/08/2020    9:07 AM 02/09/2020   10:44 AM  Fall Risk   Falls in the past year? 0 0 0 0 0  Number falls in past yr: 0 0 0 0   Injury with Fall? 0 0 0 0   Risk for fall due to : No Fall Risks No Fall Risks No Fall Risks    Follow up Falls evaluation completed Falls evaluation completed Falls evaluation completed Falls evaluation completed Falls evaluation completed    [place]:  Any stairs in or around the home? No  If so, are there any without handrails? No  Home free of loose throw rugs in walkways, pet beds, electrical cords, etc? Yes  Adequate lighting in your home to reduce risk of falls? Yes   ASSISTIVE DEVICES UTILIZED TO PREVENT FALLS:  Life alert? No  Use of a cane, walker or w/c? No  Grab bars in the bathroom? No  Shower chair or bench in shower? No  Elevated toilet seat or a handicapped toilet? No   TIMED UP AND GO:  Was the test performed? No .  Length of time to ambulate 10 feet: TELEHEALTH  sec.     Cognitive Function:        06/16/2021    9:39 AM 06/08/2020    9:09 AM 04/03/2019    1:30 PM  6CIT Screen  What Year? 0 points 0 points 0 points  What month? 0 points 0 points 0 points  What time? 0 points 0 points 0 points  Count back from 20 0 points 0 points 0 points  Months in reverse 0 points 0 points 0 points  Repeat phrase 0 points 0 points 0 points  Total Score 0 points  0 points 0 points    Immunizations Immunization History  Administered Date(s) Administered   Fluad Quad(high Dose 65+) 11/01/2018   Influenza, High Dose Seasonal PF 10/18/2017, 10/23/2019   Influenza, Seasonal, Injecte, Preservative Fre 10/26/2008   Influenza-Unspecified 11/12/2006, 12/23/2007   PFIZER(Purple Top)SARS-COV-2 Vaccination 02/22/2019, 03/19/2019   Pneumococcal Polysaccharide-23 12/23/2007   Tdap 05/08/2019    TDAP status: Up to date  Flu Vaccine status: Declined, Education has been provided regarding the importance of this vaccine but patient still declined. Advised may receive this vaccine at local pharmacy or Health Dept. Aware to provide a copy of the vaccination record if obtained from local pharmacy or Health Dept. Verbalized acceptance and understanding.  Pneumococcal vaccine status: Up to date  Covid-19 vaccine status: Completed vaccines  Qualifies for Shingles Vaccine? Yes   Zostavax completed Yes   Patient states he had this done at a pharmacy and is going to request records.  Shingrix Completed?: Yes  Screening Tests Health Maintenance  Topic Date Due   Zoster Vaccines- Shingrix (1 of 2) Never done   Pneumonia Vaccine 7+ Years old (2 - PCV) 12/22/2008   COVID-19 Vaccine (3 - Pfizer series) 05/14/2019   INFLUENZA VACCINE  08/09/2021   TETANUS/TDAP  05/07/2029   HPV VACCINES  Aged Out   Hepatitis C Screening  Discontinued    Health Maintenance  Health Maintenance Due  Topic Date Due   Zoster Vaccines- Shingrix (1 of 2) Never done   Pneumonia Vaccine 68+ Years old (2 - PCV) 12/22/2008   COVID-19  Vaccine (3 - Pfizer series) 05/14/2019    Colorectal cancer screening: No longer required.   Lung Cancer Screening: (Low Dose CT Chest recommended if Age 26-80 years, 30 pack-year currently smoking OR have quit w/in 15years.) does not qualify.   Lung Cancer Screening Referral:   Additional Screening:  Hepatitis C Screening: does qualify; patient  declined  Vision Screening: Recommended annual ophthalmology exams for early detection of glaucoma and other disorders of the eye. Is the patient up to date with their annual eye exam?  Yes Who is the provider or what is the name of the office in which the patient attends annual eye exams? [facility] If pt is not established with a provider, would they like to be referred to a provider to establish care? No .   Dental Screening: Recommended annual dental exams for proper oral hygiene  Community Resource Referral / Chronic Care Management: CRR required this visit?  No   CCM required this visit?  No      Plan:     I have personally reviewed and noted the following in the patient's chart:   Medical and social history Use of alcohol, tobacco or illicit drugs  Current medications and supplements including opioid prescriptions. Patient is not currently taking opioid prescriptions. Functional ability and status Nutritional status Physical activity Advanced directives List of other physicians Hospitalizations, surgeries, and ER visits in previous 12 months Vitals Screenings to include cognitive, depression, and falls Referrals and appointments  In addition, I have reviewed and discussed with patient certain preventive protocols, quality metrics, and best practice recommendations. A written personalized care plan for preventive services as well as general preventive health recommendations were provided to patient.     [place]   06/16/2021   Nurse Notes: non-face to face 20 mintues   Mr. Nolt , Thank you for taking time to come for your Medicare Wellness Visit. I appreciate your ongoing commitment to your health goals. Please review the following plan we discussed and let me know if I can assist you in the future.   These are the goals we discussed:  Goals   None     This is a list of the screening recommended for you and due dates:  Health Maintenance  Topic  Date Due   Zoster (Shingles) Vaccine (1 of 2) Never done   Pneumonia Vaccine (2 - PCV) 12/22/2008   COVID-19 Vaccine (3 - Pfizer series) 05/14/2019   Flu Shot  08/09/2021   Tetanus Vaccine  05/07/2029   HPV Vaccine  Aged Out   Hepatitis C Screening: USPSTF Recommendation to screen - Ages 18-79 yo.  Discontinued

## 2021-06-17 NOTE — Telephone Encounter (Signed)
Noted  

## 2021-06-21 ENCOUNTER — Telehealth: Payer: Self-pay

## 2021-06-21 NOTE — Telephone Encounter (Signed)
VOB submitted for Durolane, bilateral knee. BV pending. 

## 2021-06-30 ENCOUNTER — Other Ambulatory Visit: Payer: Self-pay | Admitting: Physician Assistant

## 2021-06-30 DIAGNOSIS — E7841 Elevated Lipoprotein(a): Secondary | ICD-10-CM

## 2021-07-12 ENCOUNTER — Other Ambulatory Visit: Payer: Self-pay | Admitting: Physician Assistant

## 2021-07-12 DIAGNOSIS — K219 Gastro-esophageal reflux disease without esophagitis: Secondary | ICD-10-CM

## 2021-07-26 ENCOUNTER — Telehealth: Payer: Self-pay | Admitting: Orthopedic Surgery

## 2021-07-26 NOTE — Telephone Encounter (Signed)
Patient checking the status of the gel injection auth approval, that was submitted a month ago per patient.     Patient request a call back ASAP

## 2021-07-27 ENCOUNTER — Other Ambulatory Visit: Payer: Self-pay

## 2021-07-27 DIAGNOSIS — M170 Bilateral primary osteoarthritis of knee: Secondary | ICD-10-CM

## 2021-07-27 NOTE — Telephone Encounter (Signed)
Talked with patient and appointment has been scheduled for gel injection.  

## 2021-08-01 ENCOUNTER — Ambulatory Visit: Payer: Medicare Other | Admitting: Orthopedic Surgery

## 2021-08-01 DIAGNOSIS — M1711 Unilateral primary osteoarthritis, right knee: Secondary | ICD-10-CM

## 2021-08-01 DIAGNOSIS — M170 Bilateral primary osteoarthritis of knee: Secondary | ICD-10-CM | POA: Diagnosis not present

## 2021-08-01 DIAGNOSIS — M1712 Unilateral primary osteoarthritis, left knee: Secondary | ICD-10-CM | POA: Diagnosis not present

## 2021-08-07 ENCOUNTER — Encounter: Payer: Self-pay | Admitting: Orthopedic Surgery

## 2021-08-07 DIAGNOSIS — M170 Bilateral primary osteoarthritis of knee: Secondary | ICD-10-CM | POA: Diagnosis not present

## 2021-08-07 DIAGNOSIS — M1712 Unilateral primary osteoarthritis, left knee: Secondary | ICD-10-CM

## 2021-08-07 MED ORDER — SODIUM HYALURONATE 60 MG/3ML IX PRSY
60.0000 mg | PREFILLED_SYRINGE | INTRA_ARTICULAR | Status: AC | PRN
  Administered 2021-08-07: 60 mg via INTRA_ARTICULAR

## 2021-08-07 MED ORDER — SODIUM HYALURONATE 60 MG/3ML IX PRSY
60.0000 mg | PREFILLED_SYRINGE | INTRA_ARTICULAR | Status: AC | PRN
  Administered 2021-08-01: 60 mg via INTRA_ARTICULAR

## 2021-08-07 MED ORDER — LIDOCAINE HCL 1 % IJ SOLN
5.0000 mL | INTRAMUSCULAR | Status: AC | PRN
  Administered 2021-08-07: 5 mL

## 2021-08-07 NOTE — Progress Notes (Signed)
   Procedure Note  Patient: Tommy Hobbs             Date of Birth: 09-May-1940           MRN: [id number]             Visit Date: 08/01/2021  Procedures: Visit Diagnoses:  1. Primary osteoarthritis of both knees     Large Joint Inj: R knee on 08/01/2021 7:42 AM Indications: diagnostic evaluation, joint swelling and pain Details: 18 G 1.5 in needle, superolateral approach  Arthrogram: No  Medications: 60 mg Sodium Hyaluronate 60 MG/3ML Outcome: tolerated well, no immediate complications Procedure, treatment alternatives, risks and benefits explained, specific risks discussed. Consent was given by the patient. Immediately prior to procedure a time out was called to verify the correct patient, procedure, equipment, support staff and site/side marked as required. Patient was prepped and draped in the usual sterile fashion.    Large Joint Inj: L knee on 08/07/2021 7:42 AM Indications: diagnostic evaluation, joint swelling and pain Details: 18 G 1.5 in needle, superolateral approach  Arthrogram: No  Medications: 5 mL lidocaine 1 %; 60 mg Sodium Hyaluronate 60 MG/3ML Outcome: tolerated well, no immediate complications Procedure, treatment alternatives, risks and benefits explained, specific risks discussed. Consent was given by the patient. Immediately prior to procedure a time out was called to verify the correct patient, procedure, equipment, support staff and site/side marked as required. Patient was prepped and draped in the usual sterile fashion.

## 2021-09-10 ENCOUNTER — Other Ambulatory Visit: Payer: Self-pay | Admitting: Physician Assistant

## 2021-09-10 DIAGNOSIS — I10 Essential (primary) hypertension: Secondary | ICD-10-CM

## 2021-11-16 ENCOUNTER — Other Ambulatory Visit: Payer: Self-pay | Admitting: Physician Assistant

## 2021-11-16 DIAGNOSIS — I10 Essential (primary) hypertension: Secondary | ICD-10-CM

## 2022-03-06 ENCOUNTER — Telehealth: Payer: Self-pay

## 2022-03-06 DIAGNOSIS — I10 Essential (primary) hypertension: Secondary | ICD-10-CM

## 2022-03-06 NOTE — Telephone Encounter (Signed)
L.O.V: 06/16/21  N.O.V: not scheduled  L.R.F: 09/14/21 Losartan 90 tab 0 refill.  OV required

## 2022-03-07 MED ORDER — LOSARTAN POTASSIUM 50 MG PO TABS: 50.0000 mg | ORAL_TABLET | Freq: Every day | ORAL | 0 refills | Status: DC

## 2022-03-07 NOTE — Addendum Note (Signed)
Addended by: Gemma Payor on: 03/07/2022 08:57 AM   Modules accepted: Orders

## 2022-03-07 NOTE — Telephone Encounter (Signed)
90 day sent to [facility]

## 2022-03-07 NOTE — Addendum Note (Signed)
Addended by: Gemma Payor on: 03/07/2022 09:00 AM   Modules accepted: Orders

## 2022-03-07 NOTE — Telephone Encounter (Signed)
Pt was unable to schedule an appointment at this time. As he just admitted his brother into Hospice in [place]. Pt states that he will call and make an appt as soon as things get back to normal.  Pt was hoping to get at least one month while he is going through this time.  Please advise

## 2022-03-07 NOTE — Telephone Encounter (Signed)
Spoke with patient who states he prefers mail order. Prescription resent to [facility]

## 2022-06-01 ENCOUNTER — Ambulatory Visit (INDEPENDENT_AMBULATORY_CARE_PROVIDER_SITE_OTHER): Payer: Medicare Other | Admitting: Family Medicine

## 2022-06-01 ENCOUNTER — Encounter: Payer: Self-pay | Admitting: Family Medicine

## 2022-06-01 VITALS — BP 171/81 | HR 75 | Temp 97.8°F | Resp 18 | Ht 71.0 in | Wt 181.0 lb

## 2022-06-01 DIAGNOSIS — J209 Acute bronchitis, unspecified: Secondary | ICD-10-CM

## 2022-06-01 DIAGNOSIS — R051 Acute cough: Secondary | ICD-10-CM

## 2022-06-01 MED ORDER — BENZONATATE 100 MG PO CAPS
100.0000 mg | ORAL_CAPSULE | Freq: Three times a day (TID) | ORAL | 0 refills | Status: DC | PRN
Start: 2022-06-01 — End: 2022-09-27

## 2022-06-01 NOTE — Progress Notes (Signed)
Acute Office Visit  Subjective:     Patient ID: Tommy Hobbs, male    DOB: [birth year], 82 y.o.   MRN: [id number]  Chief Complaint  Patient presents with   Cough    HPI Patient is in today for cough. His cough initially started about 2 weeks ago along with sore throat, rhinorrhea, congestion.  His wife was sick with something similar about 3 weeks ago and he believes that he caught it from her.  His sore throat and runny nose have resolved since that time, but he continues to have a lingering cough.  It is typically dry, though at times he does cough up clear to white mucus.  Denies fever, chills, fatigue.  Cough is his only remaining symptom.  He has not tried any over-the-counter medications.  Review of Systems  Constitutional:  Negative for chills, fever and malaise/fatigue.  HENT:  Negative for congestion, ear discharge, hearing loss, sinus pain and sore throat.   Eyes:  Negative for pain and discharge.  Respiratory:  Positive for cough. Negative for hemoptysis, sputum production, shortness of breath and wheezing.   Cardiovascular:  Negative for chest pain and palpitations.  Neurological:  Negative for dizziness and headaches.     Objective:    BP (!) 171/81 (BP Location: Left Arm, Patient Position: Sitting, Cuff Size: Normal)   Pulse 75   Temp 97.8 F (36.6 C) (Oral)   Resp 18   Ht 5\' 11"  (1.803 m)   Wt 181 lb (82.1 kg)   SpO2 96%   BMI 25.24 kg/m   Physical Exam Constitutional:      General: He is not in acute distress.    Appearance: Normal appearance.  HENT:     Head: Normocephalic and atraumatic.     Right Ear: Tympanic membrane, ear canal and external ear normal.     Left Ear: Tympanic membrane, ear canal and external ear normal.     Ears:     Comments: Cerumen bilaterally, did not limit view of TM    Nose: Nose normal. No congestion or rhinorrhea.     Mouth/Throat:     Mouth: Mucous membranes are moist.     Pharynx: No oropharyngeal exudate or  posterior oropharyngeal erythema.  Eyes:     Extraocular Movements: Extraocular movements intact.     Conjunctiva/sclera: Conjunctivae normal.     Pupils: Pupils are equal, round, and reactive to light.  Cardiovascular:     Rate and Rhythm: Normal rate and regular rhythm.     Pulses: Normal pulses.     Heart sounds: No murmur heard.    No friction rub. No gallop.  Pulmonary:     Effort: Pulmonary effort is normal.     Breath sounds: Normal breath sounds. No wheezing, rhonchi or rales.  Musculoskeletal:     Cervical back: Normal range of motion.  Skin:    General: Skin is warm and dry.  Neurological:     Mental Status: He is alert and oriented to person, place, and time.  Psychiatric:        Mood and Affect: Mood normal.      Assessment & Plan:  Acute cough -     Benzonatate; Take 1 capsule (100 mg total) by mouth 3 (three) times daily as needed for cough.  Dispense: 30 capsule; Refill: 0  Acute bronchitis, unspecified organism -     Benzonatate; Take 1 capsule (100 mg total) by mouth 3 (three) times daily as needed for cough.  Dispense: 30 capsule; Refill: 0  Lungs CTA bilaterally on exam, afebrile, isolated cough symptomatically.  Suspect bronchitis, course of Tessalon Perles to alleviate cough.  We discussed that if the cough does not get better in about a week to let me know.  Patient verbalized understanding and is agreeable with this plan.  Return if symptoms worsen or fail to improve.  Melida Quitter, PA

## 2022-07-10 ENCOUNTER — Other Ambulatory Visit: Payer: Self-pay | Admitting: Family Medicine

## 2022-07-10 DIAGNOSIS — I10 Essential (primary) hypertension: Secondary | ICD-10-CM

## 2022-07-18 ENCOUNTER — Telehealth: Payer: Self-pay

## 2022-07-18 DIAGNOSIS — E7841 Elevated Lipoprotein(a): Secondary | ICD-10-CM

## 2022-07-18 NOTE — Telephone Encounter (Signed)
Office visit required for refills.   

## 2022-08-03 ENCOUNTER — Other Ambulatory Visit: Payer: Self-pay | Admitting: *Deleted

## 2022-08-03 DIAGNOSIS — E7841 Elevated Lipoprotein(a): Secondary | ICD-10-CM

## 2022-08-03 MED ORDER — ATORVASTATIN CALCIUM 80 MG PO TABS
80.0000 mg | ORAL_TABLET | Freq: Every day | ORAL | 0 refills | Status: DC
Start: 2022-08-03 — End: 2022-09-27

## 2022-08-15 ENCOUNTER — Other Ambulatory Visit: Payer: Self-pay

## 2022-08-15 DIAGNOSIS — K219 Gastro-esophageal reflux disease without esophagitis: Secondary | ICD-10-CM

## 2022-08-15 MED ORDER — PANTOPRAZOLE SODIUM 40 MG PO TBEC
40.0000 mg | DELAYED_RELEASE_TABLET | Freq: Every day | ORAL | 2 refills | Status: DC
Start: 2022-08-15 — End: 2023-04-25

## 2022-09-27 ENCOUNTER — Ambulatory Visit (INDEPENDENT_AMBULATORY_CARE_PROVIDER_SITE_OTHER): Payer: Medicare Other | Admitting: Family Medicine

## 2022-09-27 ENCOUNTER — Encounter: Payer: Self-pay | Admitting: Family Medicine

## 2022-09-27 VITALS — BP 115/73 | HR 70 | Resp 18 | Ht 71.0 in | Wt 182.0 lb

## 2022-09-27 DIAGNOSIS — E782 Mixed hyperlipidemia: Secondary | ICD-10-CM | POA: Diagnosis not present

## 2022-09-27 DIAGNOSIS — Z125 Encounter for screening for malignant neoplasm of prostate: Secondary | ICD-10-CM

## 2022-09-27 DIAGNOSIS — E7841 Elevated Lipoprotein(a): Secondary | ICD-10-CM | POA: Diagnosis not present

## 2022-09-27 DIAGNOSIS — I10 Essential (primary) hypertension: Secondary | ICD-10-CM | POA: Diagnosis not present

## 2022-09-27 DIAGNOSIS — Z131 Encounter for screening for diabetes mellitus: Secondary | ICD-10-CM

## 2022-09-27 MED ORDER — ATORVASTATIN CALCIUM 80 MG PO TABS: 80.0000 mg | ORAL_TABLET | Freq: Every day | ORAL | 1 refills | Status: DC

## 2022-09-27 MED ORDER — LOSARTAN POTASSIUM 50 MG PO TABS: 50.0000 mg | ORAL_TABLET | Freq: Every day | ORAL | 1 refills | Status: DC

## 2022-09-27 NOTE — Assessment & Plan Note (Signed)
BP goal <140/90.  Stable, at goal.  Continue losartan 50 mg daily.  Repeating CMP for renal function and electrolytes today.  Will continue to monitor.

## 2022-09-27 NOTE — Progress Notes (Signed)
Established Patient Office Visit  Subjective   Patient ID: Tommy Hobbs, male    DOB: 08-15-1940  Age: 82 y.o. MRN: [id number]  Chief Complaint  Patient presents with   Hypertension    HPI Tommy Hobbs is a 82 y.o. male presenting today for follow up of hypertension, hyperlipidemia. Hypertension: Patient here for follow-up of elevated blood pressure. He is exercising and is adherent to low salt diet.   Pt denies chest pain, SOB, dizziness, edema, syncope, fatigue or heart palpitations. Taking losartan, reports excellent compliance with treatment. Denies side effects. Home BP 120-138/70-85 in general. Hyperlipidemia: tolerating atorvastatin well with no myalgias or significant side effects. Currently consuming a low fat and low sodium diet.  The ASCVD Risk score (Arnett DK, et al., 2019) failed to calculate for the following reasons:   The 2019 ASCVD risk score is only valid for ages 76 to 16  Outpatient Medications Prior to Visit  Medication Sig   Aspirin-Acetaminophen (GOODYS BODY PAIN PO) Take 1 packet by mouth as needed (pain).   pantoprazole (PROTONIX) 40 MG tablet Take 1 tablet (40 mg total) by mouth daily.   Psyllium (VEGETABLE LAXATIVE PO) Take 1 tablet by mouth daily as needed. [name] Brand   [DISCONTINUED] albuterol (VENTOLIN HFA) 108 (90 Base) MCG/ACT inhaler Inhale 2 puffs into the lungs every 6 (six) hours as needed for wheezing or shortness of breath.   [DISCONTINUED] atorvastatin (LIPITOR) 80 MG tablet Take 1 tablet (80 mg total) by mouth daily.   [DISCONTINUED] benzonatate (TESSALON) 100 MG capsule Take 1 capsule (100 mg total) by mouth 3 (three) times daily as needed for cough.   [DISCONTINUED] famotidine (PEPCID) 20 MG tablet Take 1 tablet (20 mg total) by mouth 2 (two) times daily.   [DISCONTINUED] losartan (COZAAR) 50 MG tablet Take 1 tablet (50 mg total) by mouth daily.   No facility-administered medications prior to visit.    ROS Negative unless  otherwise noted in HPI   Objective:     BP 115/73 (BP Location: Left Arm, Patient Position: Sitting, Cuff Size: Normal)   Pulse 70   Resp 18   Ht 5\' 11"  (1.803 m)   Wt 182 lb (82.6 kg)   SpO2 99%   BMI 25.38 kg/m   Physical Exam Constitutional:      General: He is not in acute distress.    Appearance: Normal appearance.  HENT:     Head: Normocephalic and atraumatic.  Cardiovascular:     Rate and Rhythm: Normal rate and regular rhythm.     Heart sounds: Normal heart sounds. No murmur heard.    No friction rub. No gallop.  Pulmonary:     Effort: Pulmonary effort is normal. No respiratory distress.     Breath sounds: Normal breath sounds. No wheezing, rhonchi or rales.  Skin:    General: Skin is warm and dry.  Neurological:     Mental Status: He is alert and oriented to person, place, and time.  Psychiatric:        Mood and Affect: Mood normal.     Assessment & Plan:  Essential hypertension Assessment & Plan: BP goal <140/90.  Stable, at goal.  Continue losartan 50 mg daily.  Repeating CMP for renal function and electrolytes today.  Will continue to monitor.  Orders: -     Comprehensive metabolic panel; Future -     CBC with Differential/Platelet; Future -     Losartan Potassium; Take 1 tablet (50 mg total) by  mouth daily.  Dispense: 100 tablet; Refill: 1  Mixed hyperlipidemia Assessment & Plan: Last lipid panel: LDL 68, HDL 40, triglycerides 104 on 06/09/2021.  Repeating lipid panel today, will adjust management as needed.  Continue low-fat diet and routine physical activity.  Orders: -     Comprehensive metabolic panel; Future -     Lipid panel; Future -     CBC with Differential/Platelet; Future  Elevated lipoprotein(a) -     Atorvastatin Calcium; Take 1 tablet (80 mg total) by mouth daily.  Dispense: 100 tablet; Refill: 1    Return in about 6 months (around 03/27/2023) for follow-up for HTN, HLD, fasting blood work 1 week before.    Melida Quitter, PA

## 2022-09-27 NOTE — Assessment & Plan Note (Signed)
Last lipid panel: LDL 68, HDL 40, triglycerides 104 on 06/09/2021.  Repeating lipid panel today, will adjust management as needed.  Continue low-fat diet and routine physical activity.

## 2022-09-27 NOTE — Patient Instructions (Addendum)
Keep up the good work! Your blood pressures are fantastic.  I do recommend updating your pneumonia and shingles vaccines.    The pneumonia vaccine protects you from the 20 most common causes of pneumonia and greatly reduces the risk of having complications or needing to go to the hospital if you were to get pneumonia.  Our immune systems continue to get weaker as we get older, so it is recommended to offer this additional protection to everyone starting at age 5.  The good news is that it is a single dose and you will never need any boosters ever again!  Likewise, the shingles vaccine protects you from getting shingles or having any chronic complications from getting shingles.  It is recommended for all adults starting at age 65.  This is a 2 dose series with no additional boosters after that.  If you do decide that you are interested in either of these, you can get them at your local pharmacy!

## 2022-09-28 ENCOUNTER — Other Ambulatory Visit: Payer: Self-pay | Admitting: Family Medicine

## 2022-09-28 DIAGNOSIS — E7841 Elevated Lipoprotein(a): Secondary | ICD-10-CM

## 2022-10-09 ENCOUNTER — Other Ambulatory Visit: Payer: Medicare Other

## 2022-10-09 DIAGNOSIS — Z131 Encounter for screening for diabetes mellitus: Secondary | ICD-10-CM

## 2022-10-09 DIAGNOSIS — E7841 Elevated Lipoprotein(a): Secondary | ICD-10-CM

## 2022-10-09 DIAGNOSIS — E782 Mixed hyperlipidemia: Secondary | ICD-10-CM

## 2022-10-09 DIAGNOSIS — I10 Essential (primary) hypertension: Secondary | ICD-10-CM

## 2022-10-10 LAB — CBC WITH DIFFERENTIAL/PLATELET
Basophils Absolute: 0 10*3/uL (ref 0.0–0.2)
Basos: 0 %
EOS (ABSOLUTE): 0.4 10*3/uL (ref 0.0–0.4)
Eos: 6 %
Hematocrit: 45.7 % (ref 37.5–51.0)
Hemoglobin: 16 g/dL (ref 13.0–17.7)
Immature Grans (Abs): 0 10*3/uL (ref 0.0–0.1)
Immature Granulocytes: 0 %
Lymphocytes Absolute: 2.1 10*3/uL (ref 0.7–3.1)
Lymphs: 34 %
MCH: 34.3 pg — ABNORMAL HIGH (ref 26.6–33.0)
MCHC: 35 g/dL (ref 31.5–35.7)
MCV: 98 fL — ABNORMAL HIGH (ref 79–97)
Monocytes Absolute: 0.5 10*3/uL (ref 0.1–0.9)
Monocytes: 8 %
Neutrophils Absolute: 3.2 10*3/uL (ref 1.4–7.0)
Neutrophils: 52 %
Platelets: 188 10*3/uL (ref 150–450)
RBC: 4.67 x10E6/uL (ref 4.14–5.80)
RDW: 11.7 % (ref 11.6–15.4)
WBC: 6.3 10*3/uL (ref 3.4–10.8)

## 2022-10-10 LAB — COMPREHENSIVE METABOLIC PANEL
ALT: 36 [IU]/L (ref 0–44)
AST: 27 [IU]/L (ref 0–40)
Albumin: 4.6 g/dL (ref 3.8–4.8)
Alkaline Phosphatase: 98 [IU]/L (ref 44–121)
BUN/Creatinine Ratio: 12 (ref 10–24)
BUN: 12 mg/dL (ref 8–27)
Bilirubin Total: 1 mg/dL (ref 0.0–1.2)
CO2: 23 mmol/L (ref 20–29)
Calcium: 9.6 mg/dL (ref 8.6–10.2)
Chloride: 98 mmol/L (ref 96–106)
Creatinine, Ser: 1 mg/dL (ref 0.76–1.27)
Globulin, Total: 2.1 g/dL (ref 1.5–4.5)
Glucose: 84 mg/dL (ref 70–99)
Potassium: 4.2 mmol/L (ref 3.5–5.2)
Sodium: 137 mmol/L (ref 134–144)
Total Protein: 6.7 g/dL (ref 6.0–8.5)
eGFR: 76 mL/min/{1.73_m2} (ref >59)

## 2022-10-10 LAB — LIPID PANEL
Chol/HDL Ratio: 3.3 {ratio} (ref 0.0–5.0)
Cholesterol, Total: 126 mg/dL (ref 100–199)
HDL: 38 mg/dL — ABNORMAL LOW (ref >39)
LDL Chol Calc (NIH): 67 mg/dL (ref 0–99)
Triglycerides: 115 mg/dL (ref 0–149)
VLDL Cholesterol Cal: 21 mg/dL (ref 5–40)

## 2022-10-10 LAB — HEMOGLOBIN A1C
Est. average glucose Bld gHb Est-mCnc: 114 mg/dL
Hgb A1c MFr Bld: 5.6 % (ref 4.8–5.6)

## 2022-10-15 ENCOUNTER — Other Ambulatory Visit: Payer: Self-pay | Admitting: Family Medicine

## 2022-10-15 DIAGNOSIS — I10 Essential (primary) hypertension: Secondary | ICD-10-CM

## 2022-12-12 ENCOUNTER — Encounter: Payer: Self-pay | Admitting: Family Medicine

## 2022-12-12 ENCOUNTER — Ambulatory Visit (INDEPENDENT_AMBULATORY_CARE_PROVIDER_SITE_OTHER): Payer: Medicare Other | Admitting: Family Medicine

## 2022-12-12 ENCOUNTER — Telehealth: Payer: Self-pay

## 2022-12-12 VITALS — BP 166/94 | HR 76 | Resp 18 | Ht 71.0 in | Wt 183.0 lb

## 2022-12-12 DIAGNOSIS — R21 Rash and other nonspecific skin eruption: Secondary | ICD-10-CM | POA: Diagnosis not present

## 2022-12-12 DIAGNOSIS — T7840XD Allergy, unspecified, subsequent encounter: Secondary | ICD-10-CM

## 2022-12-12 MED ORDER — PREDNISONE 10 MG PO TABS
ORAL_TABLET | ORAL | 0 refills | Status: DC
Start: 2022-12-12 — End: 2022-12-27

## 2022-12-12 MED ORDER — TACROLIMUS 0.1 % EX OINT
TOPICAL_OINTMENT | Freq: Two times a day (BID) | CUTANEOUS | 0 refills | Status: DC
Start: 2022-12-12 — End: 2022-12-27

## 2022-12-12 NOTE — Telephone Encounter (Signed)
tacrolimus (PROTOPIC) 0.1 % ointment is not covered by insurance pharmacy is wanting to know if provider want to complete a PA or switch the medication?  Please contact pharmacy and clarify.

## 2022-12-12 NOTE — Patient Instructions (Signed)
I will send in a new cream for you to use and a medicine to help your immune system calm down.

## 2022-12-12 NOTE — Telephone Encounter (Signed)
Lets do a PA

## 2022-12-12 NOTE — Telephone Encounter (Signed)
Copied from CRM [id number]. Topic: Clinical - Medication Question >> Dec 12, 2022 11:08 AM Hendricks Limes wrote: Reason for CRM: Tacrolimos ointment is not covered by the insurance. Pharmacy is calling to see if Tommy Hobbs wants to do a different ointment or a prior authorization.

## 2022-12-12 NOTE — Telephone Encounter (Signed)
He has already been using a topical corticosteroid like the ones that they recommended, and is also going to be doing an oral corticosteroid taper.  Lets do a prior authorization for tacrolimus as a calcineurin inhibitor, it has a different mechanism of action than corticosteroids and will avoid a cumulative effect of having multiple steroids.

## 2022-12-12 NOTE — Telephone Encounter (Signed)
[facility] pharmacy states the preferred formularies are;   Bethamethasone Fluocinonide 0.05% Hydrocortisone Desonide

## 2022-12-12 NOTE — Telephone Encounter (Signed)
PA was approved but out of pocket cost is $178.77.

## 2022-12-12 NOTE — Progress Notes (Signed)
Acute Office Visit  Subjective:     Patient ID: Tommy Hobbs, male    DOB: 04-07-1940, 82 y.o.   MRN: [id number]  Chief Complaint  Patient presents with   Rash    HPI Patient is in today for a rash. Patient complains of rash involving the abdomen, bilateral ankle, back, bilateral ear, face, bilateral foot, and bilateral extremities . Rash started 2 weeks ago. Appearance of rash at onset: Color of lesion(s): pink, Texture of lesion(s): flat. Rash has changed over time and has spread to almost his entire body.  Initial distribution: abdomen.  Discomfort associated with rash: is pruritic and occasionally painful .  Associated symptoms: none. Denies: abdominal pain, arthralgia, congestion, cough, decrease in appetite, decrease in energy level, fever, headache, irritability, myalgia, nausea, sore throat, and vomiting. Patient has not had previous evaluation of rash. Patient has not had previous treatment.  Patient has not had contacts with similar rash. Patient has not identified precipitant. Patient has not had new exposures that he knows of (soaps, lotions, laundry detergents, foods, medications, plants, insects or animals.).  He has not had any new medications recently.  He does have significant allergies to common ingredients in many soaps, lotions, detergents and has had a similar rash several times in the past.  It has been 15-20 years since he saw an allergy specialist.  He has tried some leftover triamcinolone cream that he had at home with minimal improvement in the rash.  ROS See HPI    Objective:    BP (!) 166/94 (BP Location: Left Arm, Patient Position: Sitting, Cuff Size: Normal)   Pulse 76   Resp 18   Ht 5\' 11"  (1.803 m)   Wt 183 lb (83 kg)   SpO2 97%   BMI 25.52 kg/m   Physical Exam Constitutional:      General: He is not in acute distress.    Appearance: Normal appearance.  HENT:     Head: Normocephalic and atraumatic.  Cardiovascular:     Rate and Rhythm: Normal  rate and regular rhythm.     Heart sounds: Normal heart sounds. No murmur heard.    No friction rub. No gallop.  Pulmonary:     Effort: Pulmonary effort is normal. No respiratory distress.     Breath sounds: Normal breath sounds. No wheezing, rhonchi or rales.  Skin:    General: Skin is warm and dry.     Coloration: Skin is not cyanotic, jaundiced or pale.     Findings: Rash (Erythematous, papular, peeling in some places.  Distribution over abdomen, chest, back, bilateral arms and legs, bilateral temples, bilateral ears) present.  Neurological:     Mental Status: He is alert and oriented to person, place, and time.  Psychiatric:        Mood and Affect: Mood normal.      Assessment & Plan:  Rash and nonspecific skin eruption -     Tacrolimus; Apply topically 2 (two) times daily.  Dispense: 100 g; Refill: 0 -     predniSONE; Take 5 tablets (50 mg total) by mouth daily with breakfast for 7 days, THEN 3 tablets (30 mg total) daily with breakfast for 5 days, THEN 2 tablets (20 mg total) daily with breakfast for 1 day, THEN 1.5 tablets (15 mg total) daily with breakfast for 1 day, THEN 1 tablet (10 mg total) daily with breakfast for 1 day, THEN 0.5 tablets (5 mg total) daily with breakfast for 1 day.  Dispense: 55  tablet; Refill: 0 -     Ambulatory referral to Allergy  Allergy, subsequent encounter -     Tacrolimus; Apply topically 2 (two) times daily.  Dispense: 100 g; Refill: 0 -     predniSONE; Take 5 tablets (50 mg total) by mouth daily with breakfast for 7 days, THEN 3 tablets (30 mg total) daily with breakfast for 5 days, THEN 2 tablets (20 mg total) daily with breakfast for 1 day, THEN 1.5 tablets (15 mg total) daily with breakfast for 1 day, THEN 1 tablet (10 mg total) daily with breakfast for 1 day, THEN 0.5 tablets (5 mg total) daily with breakfast for 1 day.  Dispense: 55 tablet; Refill: 0 -     Ambulatory referral to Allergy  Encourage patient to continue looking for a potential  trigger of this rash.  Given widespread distribution, start oral prednisone taper.  In addition to taper, start tacrolimus ointment with separate mechanism of action.  Also provided referral to allergist for reevaluation to determine if changes in management are necessary or if management of flares is adequate.  Return in about 2 weeks (around 12/26/2022), or if symptoms worsen or fail to improve, for follow-up for rash, HTN.  Melida Quitter, PA

## 2022-12-13 ENCOUNTER — Telehealth: Payer: Self-pay | Admitting: Family Medicine

## 2022-12-13 NOTE — Telephone Encounter (Signed)
Please contact to schedule an AWV by the end of the year!

## 2022-12-13 NOTE — Telephone Encounter (Signed)
Thank you for the update.  If the out-of-pocket cost is too high, then I would recommend an over-the-counter calamine lotion or eczema lotion for increased moisture which will help with the itching.

## 2022-12-13 NOTE — Telephone Encounter (Signed)
Message sent to AWV team to schedule.

## 2022-12-13 NOTE — Telephone Encounter (Signed)
Patient has been notified of prescription status as well as provider's recommendations. Patient states that the Prednisone is helping and he will continue to use Calamine lotion.  All questions and concerns have been addressed.

## 2022-12-27 ENCOUNTER — Ambulatory Visit (INDEPENDENT_AMBULATORY_CARE_PROVIDER_SITE_OTHER): Payer: Medicare Other | Admitting: Family Medicine

## 2022-12-27 ENCOUNTER — Encounter: Payer: Self-pay | Admitting: Family Medicine

## 2022-12-27 VITALS — BP 120/70 | HR 76 | Resp 18 | Ht 71.0 in | Wt 182.0 lb

## 2022-12-27 DIAGNOSIS — L239 Allergic contact dermatitis, unspecified cause: Secondary | ICD-10-CM | POA: Insufficient documentation

## 2022-12-27 NOTE — Progress Notes (Signed)
Established Patient Office Visit  Subjective   Patient ID: Tommy Hobbs, male    DOB: [birth year]  Age: 82 y.o. MRN: [id number]  Chief Complaint  Patient presents with   Rash    HPI Tommy Hobbs is a 82 y.o. male presenting today for follow up of rash.  At last appointment, complained of 2-week widespread pruritic rash involving abdomen, bilateral ankles, ears, face, feet, extremities, back.  He has a known history of significant allergies to common ingredients found in many soaps, lotions, detergents with multiple similar rashes in the past.  He had been using leftover triamcinolone cream with minimal improvement.  At that time, recommended identifying trigger of rash, prednisone course, and tacrolimus cream, insurance would not cover the cream but prednisone course improved rash but did not completely resolve.  Referral to allergist was also made, he has not scheduled an appointment yet.  He has not identified a trigger that may have caused this flare.  Outpatient Medications Prior to Visit  Medication Sig   Aspirin-Acetaminophen (GOODYS BODY PAIN PO) Take 1 packet by mouth as needed (pain).   atorvastatin (LIPITOR) 80 MG tablet Take 1 tablet (80 mg total) by mouth daily.   losartan (COZAAR) 50 MG tablet TAKE 1 TABLET BY MOUTH DAILY   pantoprazole (PROTONIX) 40 MG tablet Take 1 tablet (40 mg total) by mouth daily.   Psyllium (VEGETABLE LAXATIVE PO) Take 1 tablet by mouth daily as needed. [name]   [DISCONTINUED] predniSONE (DELTASONE) 10 MG tablet Take 5 tablets (50 mg total) by mouth daily with breakfast for 7 days, THEN 3 tablets (30 mg total) daily with breakfast for 5 days, THEN 2 tablets (20 mg total) daily with breakfast for 1 day, THEN 1.5 tablets (15 mg total) daily with breakfast for 1 day, THEN 1 tablet (10 mg total) daily with breakfast for 1 day, THEN 0.5 tablets (5 mg total) daily with breakfast for 1 day.   [DISCONTINUED] tacrolimus (PROTOPIC) 0.1 % ointment Apply  topically 2 (two) times daily.   No facility-administered medications prior to visit.   Allergies  Allergen Reactions   Other Hives and Rash    Pt has issues with certain ingredients in lotion.  imidazolidinyl urea, DMDM hydantoin, 2-bromo-2-nitropropane-1, diazolidinyl urea, quaternium 15, cocamidopropyl dimethyl glycine   Azolid [Phenylbutazone]    Diazolidurea-Iodopropbutylcarb    Quanterra [Ginkgo Biloba]    Cocamidopropyl Betaine Hives and Rash   Esomeprazole Magnesium Hives and Rash   Tegopen [Cloxacillin Sodium] Rash     ROS Negative unless otherwise noted in HPI   Objective:     BP 120/70 (BP Location: Left Arm, Patient Position: Sitting, Cuff Size: Normal)   Pulse 76   Resp 18   Ht 5\' 11"  (1.803 m)   Wt 182 lb (82.6 kg)   SpO2 97%   BMI 25.38 kg/m   Physical Exam Vitals reviewed.  Constitutional:      General: He is not in acute distress.    Appearance: Normal appearance. He is not ill-appearing.  HENT:     Head: Normocephalic and atraumatic.     Nose: Nose normal.  Eyes:     Conjunctiva/sclera: Conjunctivae normal.  Pulmonary:     Effort: Pulmonary effort is normal.  Musculoskeletal:     Cervical back: Normal range of motion.  Skin:    Coloration: Skin is not jaundiced or pale.     Findings: No bruising.  Neurological:     Mental Status: He is alert and oriented  to person, place, and time.  Psychiatric:        Mood and Affect: Mood normal.      Assessment & Plan:  Allergic dermatitis Assessment & Plan: Known allergens, and in lotions/soaps/detergents: imidazolidinyl urea, DMDM hydantoin, 2-bromo-2-nitropropane-1, diazolidinyl urea, quaternium 15, cocamidopropyl dimethyl glycine.  Advised patient to go home and look at the ingredients lists on any soaps, lotions, laundry detergents, etc. that he has any contact with to identify potential triggers.  Without identifying trigger and avoiding, rash will continue to recur.  Also advised to call the  allergist to schedule appointment as soon as possible.     Return with Allergy specialist as soon as possible.    Melida Quitter, PA

## 2022-12-27 NOTE — Patient Instructions (Addendum)
Contact the allergy specialist to schedule an appointment.  Their phone number is [phone].  When you get home, looked through any soap, laundry detergent, lotion that you use.  If it has one of the following ingredients, these are the ones that we have on record that you have an allergy to: imidazolidinyl urea, DMDM hydantoin, 2-bromo-2-nitropropane-1, diazolidinyl urea, quaternium 15, cocamidopropyl dimethyl glycine

## 2022-12-27 NOTE — Assessment & Plan Note (Signed)
Known allergens, and in lotions/soaps/detergents: imidazolidinyl urea, DMDM hydantoin, 2-bromo-2-nitropropane-1, diazolidinyl urea, quaternium 15, cocamidopropyl dimethyl glycine.  Advised patient to go home and look at the ingredients lists on any soaps, lotions, laundry detergents, etc. that he has any contact with to identify potential triggers.  Without identifying trigger and avoiding, rash will continue to recur.  Also advised to call the allergist to schedule appointment as soon as possible.

## 2023-01-01 ENCOUNTER — Ambulatory Visit (INDEPENDENT_AMBULATORY_CARE_PROVIDER_SITE_OTHER): Payer: Medicare Other | Admitting: Family Medicine

## 2023-01-01 VITALS — BP 116/72 | HR 89 | Ht 71.0 in | Wt 184.0 lb

## 2023-01-01 DIAGNOSIS — L239 Allergic contact dermatitis, unspecified cause: Secondary | ICD-10-CM | POA: Diagnosis not present

## 2023-01-01 MED ORDER — TRIAMCINOLONE ACETONIDE 0.1 % EX OINT: 1.0000 | TOPICAL_OINTMENT | Freq: Two times a day (BID) | CUTANEOUS | 0 refills | Status: DC

## 2023-01-01 NOTE — Patient Instructions (Addendum)
It was nice to see you today,  We addressed the following topics today: -I am sending in a prescription for the triamcinolone ointment.  This is stronger than the triamcinolone cream which I believe you are taking now.  Use it once a day. - If you need something for itch and you have already put on the triamcinolone for the day, you can try over-the-counter Sarna.  Make sure you read the active and inactive ingredients list in regards to your allergies.  Have a great day,  Frederic Jericho, MD

## 2023-01-01 NOTE — Progress Notes (Signed)
Patient came in requesting additional oral steroids for his rash.  I have not seen the patient before and did not see any pictures of his rash so we schedule him as a nurse visit so that I can evaluate the  rash.  On exam it appears mild.  Some erythema on the knees.  Some mild areas on the anterior torso.  Discussed side effects associated with continued oral steroid use.  He appears to be using a old bottle of triamcinolone topical, which I believed to be the cream.  He describes it as white.  Advised him I can send in the ointment which is a higher potency.  Also recommended he use over-the-counter Sarna.  Has allergies to lotion ingredients so advised him to look at the inactive ingredients in [place] before he uses it.  Does not appear that the allergies he has listed are included in the inactive ingredients of Sarna from what I can tell.

## 2023-01-17 DIAGNOSIS — M25511 Pain in right shoulder: Secondary | ICD-10-CM | POA: Diagnosis not present

## 2023-01-18 DIAGNOSIS — M25511 Pain in right shoulder: Secondary | ICD-10-CM | POA: Diagnosis not present

## 2023-01-22 ENCOUNTER — Other Ambulatory Visit: Payer: Self-pay | Admitting: Family Medicine

## 2023-01-22 DIAGNOSIS — E7841 Elevated Lipoprotein(a): Secondary | ICD-10-CM

## 2023-01-23 ENCOUNTER — Ambulatory Visit: Payer: Medicare Other | Admitting: Allergy & Immunology

## 2023-01-23 ENCOUNTER — Encounter: Payer: Self-pay | Admitting: Allergy & Immunology

## 2023-01-23 ENCOUNTER — Other Ambulatory Visit: Payer: Self-pay

## 2023-01-23 VITALS — BP 148/76 | HR 69 | Temp 98.2°F | Resp 16 | Ht 67.72 in | Wt 183.9 lb

## 2023-01-23 DIAGNOSIS — L299 Pruritus, unspecified: Secondary | ICD-10-CM

## 2023-01-23 DIAGNOSIS — L253 Unspecified contact dermatitis due to other chemical products: Secondary | ICD-10-CM

## 2023-01-23 DIAGNOSIS — R21 Rash and other nonspecific skin eruption: Secondary | ICD-10-CM | POA: Diagnosis not present

## 2023-01-23 MED ORDER — EPICERAM EX EMUL: 1.0000 | Freq: Two times a day (BID) | CUTANEOUS | 5 refills | Status: AC | PRN

## 2023-01-23 NOTE — Progress Notes (Addendum)
 NEW PATIENT  Date of Service/Encounter:  01/23/23  Consult requested by: Wallace Joesph LABOR, PA   Assessment:   Rash - getting some labs today  Intense pruritus    Contact dermatitis - evidently with a history of positive reaction to multiple triggers (imidazolidinyl urea, DMDM hydantoin, 2-bromo-2-nitropropane-1, diazolidinyl urea, quaternium 15, cocamidopropyl dimethyl glycine)  Plan/Recommendations:   1. Rash - We are going to get some labs to rule out weird causes of itching. - This might help figure out the trigger and would help guide our treatment options.  - We will call you in 1-2 weeks with the results of the testing.  - Schedule yourself for contact dermatitis testing with patches. - These are placed on a Monday and then read on a Wednesday and Friday. - This will clue us  into whether this is related to some other exposure in your life.  - In the meantime, start Zyrtec (cetirizine) 10mg  twice daily to stay ahead of the itching.  - Try Epiceram twice daily as needed to see if this helps. - We might need to an injectable medication to stay ahead of the itching/rash.   2. Return in about 1 week (around 01/30/2023) for [facility] TESTING. You can have the follow up appointment with Dr. Iva or a Nurse Practicioner (our Nurse Practitioners are excellent and always have Physician oversight!).     This note in its entirety was forwarded to the Provider who requested this consultation.  Subjective:   Tommy Hobbs is a 83 y.o. male presenting today for evaluation of  Chief Complaint  Patient presents with   Rash    Tommy Hobbs has a history of the following: Patient Active Problem List   Diagnosis Date Noted   Allergic dermatitis 12/27/2022   Benign neoplasm of colon 06/16/2021   Elevated PSA 06/16/2021   Hypertrophy of prostate without urinary obstruction and other lower urinary tract symptoms (LUTS) 06/16/2021   Osteoarthritis 06/16/2021   Prostatitis,  chronic 06/16/2021   Ventral hernia 06/16/2021   Vitamin D  deficiency 06/16/2021   Overweight (BMI 25.0-29.9) 08/30/2017   Essential hypertension 08/30/2017   Mixed hyperlipidemia 05/02/2017   GERD (gastroesophageal reflux disease) 01/25/2017   h/o Dysrhythmia 01/25/2017   History of BPH 01/25/2017   DDD (degenerative disc disease) 01/25/2017   h/o PTSD (post-traumatic stress disorder)- served in [place] 01/25/2017   Left inguinal hernia 07/17/2013   Other ill-defined and unknown causes of morbidity and mortality 01/09/1969    History obtained from: chart review and patient.  Discussed the use of AI scribe software for clinical note transcription with the patient and/or guardian, who gave verbal consent to proceed.  Tommy Hobbs was referred by Wallace Joesph LABOR, PA.     Tommy Hobbs is a 83 y.o. male presenting for an evaluation of itching and a rash.  Tommy Hobbs reports that he has a month-long history of generalized pruritus and dry skin. The itching is described as severe and affects the entire body. He has tried various over-the-counter remedies and moisturizers, including Sarna, but reports minimal relief. He also uses a humidifier regularly to manage the dry skin.  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 He has not seen a dermatologist for the current issue. The patient has also been prescribed triamcinolone  ointment, which he reports as not being particularly effective.  He was on a course of prednisone , which provided some relief, but acknowledges the limitations of long-term use.  This was in the month of December 2024. There have been no new exposures or changes in the patient's environment, except for a new dog acquired a year ago. The patient denies any gastrointestinal issues or other systemic symptoms associated with the skin condition.  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 The patient lives with his spouse, who does not share the same skin issues.     He had what sounds like patch testing done 20+ years ago that was reactive to imidazolidinyl urea, DMDM hydantoin, 2-bromo-2-nitropropane-1, diazolidinyl urea, quaternium 15, cocamidopropyl dimethyl glycine. This was done from an physician in the [place] area, but he does not remember the name.   Otherwise, there is no history of other atopic diseases, including drug allergies, stinging insect allergies, or contact dermatitis. There is no significant infectious history. Vaccinations are up to date.    Past Medical History: Patient Active Problem List   Diagnosis Date Noted   Allergic dermatitis 12/27/2022   Benign neoplasm of colon 06/16/2021   Elevated PSA 06/16/2021   Hypertrophy of prostate without urinary obstruction and other lower urinary tract symptoms (LUTS) 06/16/2021   Osteoarthritis 06/16/2021   Prostatitis, chronic 06/16/2021   Ventral hernia 06/16/2021   Vitamin D  deficiency 06/16/2021   Overweight (BMI 25.0-29.9) 08/30/2017   Essential hypertension 08/30/2017   Mixed hyperlipidemia 05/02/2017   GERD (gastroesophageal reflux disease) 01/25/2017   h/o Dysrhythmia 01/25/2017   History of BPH 01/25/2017   DDD (degenerative disc disease) 01/25/2017   h/o PTSD (post-traumatic stress disorder)- served in [place] 01/25/2017   Left inguinal hernia 07/17/2013   Other ill-defined and unknown causes of morbidity and mortality 01/09/1969    Medication List:  Allergies as of 01/23/2023       Reactions   Other Hives, Rash   Pt has issues with certain ingredients in lotion. imidazolidinyl urea, DMDM hydantoin, 2-bromo-2-nitropropane-1, diazolidinyl urea, quaternium 15, cocamidopropyl dimethyl glycine   Azolid [phenylbutazone]    Diazolidurea-iodopropbutylcarb    Quanterra  [ginkgo Biloba]    Cocamidopropyl Betaine Hives, Rash   Esomeprazole Magnesium  Hives, Rash   Tegopen [cloxacillin Sodium] Rash        Medication List        Accurate as of January 23, 2023  1:16 PM. If you have any questions, ask your nurse or doctor.          atorvastatin  80 MG tablet Commonly known as: LIPITOR TAKE 1 TABLET BY MOUTH DAILY   GOODYS BODY PAIN PO Take 1 packet by mouth as needed (pain).   losartan  50 MG tablet Commonly known as: COZAAR  TAKE 1 TABLET BY MOUTH DAILY   pantoprazole  40 MG tablet Commonly known as: PROTONIX  Take 1 tablet (40 mg total) by mouth daily.   triamcinolone  ointment 0.1 % Commonly known as: KENALOG  Apply 1 Application topically 2 (two) times daily.   VEGETABLE LAXATIVE PO Take 1 tablet by mouth daily as needed. [name] Brand        Birth History: non-contributory  Developmental History: non-contributory  Past Surgical History: Past Surgical History:  Procedure Laterality Date   APPENDECTOMY  1977   CARDIAC CATHETERIZATION  2004   CATARACT EXTRACTION, BILATERAL Bilateral    CERVICAL FUSION  2011   CHOLECYSTECTOMY  1977   open   COLONOSCOPY     INGUINAL HERNIA REPAIR Right 1977   right  INGUINAL HERNIA REPAIR Left 08/04/2013   Procedure: OPEN LEFT INGUINAL HERNIA REPAIR;  Surgeon: Elon CHRISTELLA Pacini, MD;  Location: [facility];  Service: General;  Laterality: Left;   INSERTION OF MESH N/A 08/04/2013   Procedure: INSERTION OF MESH;  Surgeon: Elon CHRISTELLA Pacini, MD;  Location: [facility];  Service: General;  Laterality: N/A;   KNEE ARTHROSCOPY Left    KYPHOPLASTY N/A 08/17/2014   Procedure: CLEVE ONE;  Surgeon: Lamar Peaches, MD;  Location: [facility];  Service: Neurosurgery;  Laterality: N/A;   SUPRAPUBIC PROSTATECTOMY  2012   partial for severe BPH     Family History: Family History  Problem Relation Age of Onset   Cancer Brother        kidney   Diabetes Brother     Allergic rhinitis Neg Hx    Angioedema Neg Hx    Asthma Neg Hx    Eczema Neg Hx    Urticaria Neg Hx      Social History: Tommy Hobbs lives at home with his wife and a dog named Clayborne who has been with them for one year or so. Tommy Hobbs  He lives in a house that is 82 years old.  There is carpeting and wood in the main living areas and carpeting in the bedroom.  They have electric heating and central cooling with a heat pump as well.  There are 2 dogs inside of the home.  There are dust mite covers on the bed as well as the pillows.  There is no tobacco exposure.  He is currently retired.  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   Review of systems otherwise negative other than that mentioned in the HPI.    Objective:   Blood pressure (!) 148/76, pulse 69, temperature 98.2 F (36.8 C), temperature source Temporal, resp. rate 16, height 5' 7.72 (1.72 m), weight 183 lb 14.4 oz (83.4 kg). Body mass index is 28.2 kg/m.     Physical Exam Vitals reviewed.  Constitutional:      Appearance: He is well-developed.  HENT:     Head: Normocephalic and atraumatic.     Right Ear: Tympanic membrane, ear canal and external ear normal. No drainage, swelling or tenderness. Tympanic membrane is not injected, scarred, erythematous, retracted or bulging.     Left Ear: Tympanic membrane, ear canal and external ear normal. No drainage, swelling or tenderness. Tympanic membrane is not injected, scarred, erythematous, retracted or bulging.     Nose: No nasal deformity, septal deviation, mucosal edema or rhinorrhea.     Right Turbinates: Enlarged and swollen.     Left Turbinates: Enlarged and swollen.     Right Sinus: No maxillary sinus tenderness or frontal sinus tenderness.     Left Sinus: No maxillary sinus tenderness or frontal sinus tenderness.     Mouth/Throat:     Mouth: Mucous membranes are not pale and not  dry.     Pharynx: Uvula midline.  Eyes:     General:        Right eye: No discharge.        Left eye: No discharge.     Conjunctiva/sclera: Conjunctivae normal.     Right eye: Right conjunctiva is not injected. No chemosis.    Left eye: Left conjunctiva is not injected. No chemosis.    Pupils: Pupils are equal, round, and reactive to light.  Cardiovascular:  Rate and Rhythm: Normal rate and regular rhythm.     Heart sounds: Normal heart sounds.  Pulmonary:     Effort: Pulmonary effort is normal. No tachypnea, accessory muscle usage or respiratory distress.     Breath sounds: Normal breath sounds. No wheezing, rhonchi or rales.  Chest:     Chest wall: No tenderness.  Abdominal:     Tenderness: There is no abdominal tenderness. There is no guarding or rebound.  Lymphadenopathy:     Head:     Right side of head: No submandibular, tonsillar or occipital adenopathy.     Left side of head: No submandibular, tonsillar or occipital adenopathy.     Cervical: No cervical adenopathy.  Skin:    General: Skin is warm.     Capillary Refill: Capillary refill takes less than 2 seconds.     Coloration: Skin is not pale.     Findings: Rash present. No abrasion, erythema or petechiae. Rash is not papular, urticarial or vesicular.     Comments: Faint rash over the abdomen and chest. There are some excoriations noted.  Neurological:     Mental Status: He is alert.  Psychiatric:        Behavior: Behavior is cooperative.         Diagnostic studies: labs sent instead           Marty Shaggy, MD [facility] 

## 2023-01-23 NOTE — Addendum Note (Signed)
 Addended by: Alfonse Spruce on: 01/23/2023 01:26 PM   Modules accepted: Orders

## 2023-01-23 NOTE — Patient Instructions (Addendum)
 1. Rash - We are going to get some labs to rule out weird causes of itching. - This might help figure out the trigger and would help guide our treatment options.  - We will call you in 1-2 weeks with the results of the testing.  - Schedule yourself for contact dermatitis testing with patches. - These are placed on a Monday and then read on a Wednesday and Friday. - This will clue us  into whether this is related to some other exposure in your life.  - In the meantime, start Zyrtec (cetirizine) 10mg  twice daily to stay ahead of the itching.  - Try Epiceram twice daily as needed to see if this helps. - We might need to an injectable medication to stay ahead of the itching/rash.   2. Return in about 1 week (around 01/30/2023) for [facility] TESTING. You can have the follow up appointment with Dr. Iva or a Nurse Practicioner (our Nurse Practitioners are excellent and always have Physician oversight!).    Please inform us  of any Emergency Department visits, hospitalizations, or changes in symptoms. Call us  before going to the ED for breathing or allergy symptoms since we might be able to fit you in for a sick visit. Feel free to contact us  anytime with any questions, problems, or concerns.  It was a pleasure to meet you today!  Websites that have reliable patient information: 1. American Academy of Asthma, Allergy, and Immunology: www.aaaai.org 2. Food Allergy Research and Education (FARE): foodallergy.org 3. Mothers of Asthmatics: http://www.asthmacommunitynetwork.org 4. American College of Allergy, Asthma, and Immunology: www.acaai.org      "Like" us  on Facebook and Instagram for our latest updates!      A healthy democracy works best when [employer] participate! Make sure you are registered to vote! If you have moved or changed any of your contact information, you will need to get this updated before voting! Scan the QR codes below to learn more!        True Test looks for the  following sensitivities:

## 2023-01-26 LAB — ALPHA-GAL PANEL
Allergen Lamb IgE: <0.1 kU/L
Beef IgE: <0.1 kU/L
IgE (Immunoglobulin E), Serum: 12 [IU]/mL (ref 6–495)
O215-IgE Alpha-Gal: <0.1 kU/L
Pork IgE: <0.1 kU/L

## 2023-02-02 LAB — PROTEIN ELECTROPHORESIS, SERUM, WITH REFLEX
A/G Ratio: 1.9 — ABNORMAL HIGH (ref 0.7–1.7)
Albumin ELP: 4.4 g/dL (ref 2.9–4.4)
Alpha 1: 0.2 g/dL (ref 0.0–0.4)
Alpha 2: 0.8 g/dL (ref 0.4–1.0)
Beta: 0.9 g/dL (ref 0.7–1.3)
Gamma Globulin: 0.4 g/dL (ref 0.4–1.8)
Globulin, Total: 2.3 g/dL (ref 2.2–3.9)

## 2023-02-02 LAB — ALLERGENS W/COMP RFLX AREA 2
Alternaria Alternata IgE: <0.1 kU/L
Aspergillus Fumigatus IgE: <0.1 kU/L
Bermuda Grass IgE: <0.1 kU/L
Cedar, Mountain IgE: <0.1 kU/L
Cladosporium Herbarum IgE: <0.1 kU/L
Cockroach, German IgE: <0.1 kU/L
Common Silver Birch IgE: <0.1 kU/L
Cottonwood IgE: <0.1 kU/L
D Farinae IgE: <0.1 kU/L
D Pteronyssinus IgE: <0.1 kU/L
E001-IgE Cat Dander: <0.1 kU/L
E005-IgE Dog Dander: <0.1 kU/L
Elm, American IgE: <0.1 kU/L
IgE (Immunoglobulin E), Serum: 13 [IU]/mL (ref 6–495)
Johnson Grass IgE: <0.1 kU/L
Maple/Box Elder IgE: <0.1 kU/L
Mouse Urine IgE: <0.1 kU/L
Oak, White IgE: <0.1 kU/L
Pecan, Hickory IgE: <0.1 kU/L
Penicillium Chrysogen IgE: <0.1 kU/L
Pigweed, Rough IgE: <0.1 kU/L
Ragweed, Short IgE: <0.1 kU/L
Sheep Sorrel IgE Qn: <0.1 kU/L
Timothy Grass IgE: <0.1 kU/L
White Mulberry IgE: <0.1 kU/L

## 2023-02-02 LAB — CMP14+EGFR
ALT: 32 [IU]/L (ref 0–44)
AST: 25 [IU]/L (ref 0–40)
Albumin: 4.5 g/dL (ref 3.7–4.7)
Alkaline Phosphatase: 111 [IU]/L (ref 44–121)
BUN/Creatinine Ratio: 15 (ref 10–24)
BUN: 18 mg/dL (ref 8–27)
Bilirubin Total: 0.7 mg/dL (ref 0.0–1.2)
CO2: 21 mmol/L (ref 20–29)
Calcium: 9.3 mg/dL (ref 8.6–10.2)
Chloride: 101 mmol/L (ref 96–106)
Creatinine, Ser: 1.19 mg/dL (ref 0.76–1.27)
Globulin, Total: 2.2 g/dL (ref 1.5–4.5)
Glucose: 83 mg/dL (ref 70–99)
Potassium: 3.8 mmol/L (ref 3.5–5.2)
Sodium: 139 mmol/L (ref 134–144)
Total Protein: 6.7 g/dL (ref 6.0–8.5)
eGFR: 61 mL/min/{1.73_m2} (ref >59)

## 2023-02-02 LAB — TRYPTASE: Tryptase: 10.5 ug/L (ref 2.2–13.2)

## 2023-02-02 LAB — C-REACTIVE PROTEIN: CRP: 3 mg/L (ref 0–10)

## 2023-02-02 LAB — SEDIMENTATION RATE: Sed Rate: 2 mm/h (ref 0–30)

## 2023-02-02 LAB — THYROID ANTIBODIES
Thyroglobulin Antibody: <1 [IU]/mL (ref 0.0–0.9)
Thyroperoxidase Ab SerPl-aCnc: <9 [IU]/mL (ref 0–34)

## 2023-02-02 LAB — CHRONIC URTICARIA: cu index: 2.6 (ref <10)

## 2023-02-02 LAB — ANTINUCLEAR ANTIBODIES, IFA: ANA Titer 1: NEGATIVE

## 2023-02-05 ENCOUNTER — Encounter: Payer: Self-pay | Admitting: Family Medicine

## 2023-02-05 ENCOUNTER — Ambulatory Visit (INDEPENDENT_AMBULATORY_CARE_PROVIDER_SITE_OTHER): Payer: Medicare Other | Admitting: Family Medicine

## 2023-02-05 DIAGNOSIS — L259 Unspecified contact dermatitis, unspecified cause: Secondary | ICD-10-CM | POA: Insufficient documentation

## 2023-02-05 DIAGNOSIS — L235 Allergic contact dermatitis due to other chemical products: Secondary | ICD-10-CM | POA: Diagnosis not present

## 2023-02-05 NOTE — Progress Notes (Signed)
Follow-up Note  RE: Tommy Hobbs MRN: [id number] DOB: May 19, 1940 Date of Office Visit: 02/05/2023  Primary care provider: Melida Quitter, PA Referring provider: Melida Quitter, PA   Tommy Hobbs returns to the office today for the patch test placement, given suspected history of contact dermatitis.    Diagnostics: True Test patches placed.   Plan:   Allergic contact dermatitis - Instructions provided on care of the patches for the next 48 hours. Tommy Hobbs was instructed to avoid showering for the next 48 hours. Tommy Hobbs will follow up in 48 hours and 96 hours for patch readings.    Call the clinic if this treatment plan is not working well for you  Follow up in 2 days or sooner if needed.  Thank you for the opportunity to care for this patient.  Please do not hesitate to contact me with questions.  Thermon Leyland, FNP [facility]

## 2023-02-05 NOTE — Patient Instructions (Signed)
Diagnostics: True Test patches placed.   Plan:   Allergic contact dermatitis - Instructions provided on care of the patches for the next 48 hours. Tommy Hobbs was instructed to avoid showering for the next 48 hours. Tommy Hobbs will follow up in 48 hours and 96 hours for patch readings.    Call the clinic if this treatment plan is not working well for you  Follow up in 2 days or sooner if needed.

## 2023-02-06 NOTE — Progress Notes (Unsigned)
   Follow Up Note  RE: BRYDON SPAHR MRN: [id number] DOB: Jun 05, 1940 Date of Office Visit: 02/07/2023  Referring provider: Melida Quitter, PA Primary care provider: Melida Quitter, PA  History of Present Illness: I had the pleasure of seeing Tommy Hobbs for a follow up visit at the [facility] on 02/07/2023. He is a 83 y.o. male, who is being followed for dermatitis. Today he is here for initial patch test interpretation, given suspected history of contact dermatitis.   Diagnostics:  TRUE TEST 48 hour reading:   T.R.U.E. Test - 02/07/23 0900       Test Information   Time Antigen Placed 1030    Manufacturer ALK Abello    Location Back    Number of Test 36    Reading Interval Day 3;Day 5    Panel Panel 1;Panel 2;Panel 3      Panel 1   1. Nickel Sulfate 0    2. Wool Alcohols 0    3. Neomycin Sulfate 0    4. Potassium Dichromate 0    5. Caine Mix 0    6. Fragrance Mix 0    7. Colophony 0    8. Paraben Mix 0    9. Negative Control 0    10. Balsam of [place] 0    11. Ethylenediamine Dihydrochloride 0    12. Cobalt Dichloride 0      Panel 2   13. p-tert Butylphenol Formaldehyde Resin 0    14. Epoxy Resin 0    15. Carba Mix 0    16.  Black Rubber Mix 0    17. Cl+ Me-Isothiazolinone 2    18. Quaternium-15 2    19. Methyldibromo Glutaronitrile 0    20. p-Phenylenediamine 0    21. Formaldehyde 2    22. Mercapto Mix 0    23. Thimerosal 0    24. Thiuram Mix 0      Panel 3   25. Diazolidinyl Urea --   +/-   26. Quinoline Mix 0    27. Tixocortol-21-Pivalate 1    28. Gold Sodium Thiosulfate 0    29. Imidazolidinyl Urea 1    30. Budesonide 0    31. Hydrocortisone-17-Butyrate 0    32. Mercaptobenzothiazole 0    33. Bacitracin 0    34. Parthenolide 0    35. Disperse Blue 106 0    36. 2-Bromo-2-Nitropropane-1,3-diol --   +/-             Assessment and Plan: Starr is a 83 y.o. male with: Allergic contact dermatitis due to other  agents TRUE Patches removed and 48 hour reading was positive to: Cl+ Me-Isothiazolinone Quaternium-15 Formaldehyde Tixocortol-21-Pivalate Imidazolidinyl Urea Borderline positive to: Diazolidinyl Urea 2-Bromo-2-Nitropropane-1,3-diol The patient has been provided detailed information regarding the substances he is sensitive to, as well as products containing the substances.  Meticulous avoidance of these substances is recommended.   Return in about 2 days (around 02/09/2023) for Patch reading.  It was my pleasure to see Tommy Hobbs today and participate in his care. Please feel free to contact me with any questions or concerns.  Sincerely,  Wyline Mood, DO Allergy & Immunology  [facility] office: [phone] [facility] office: [phone] [place] office: [phone]

## 2023-02-07 ENCOUNTER — Ambulatory Visit (INDEPENDENT_AMBULATORY_CARE_PROVIDER_SITE_OTHER): Payer: Medicare Other | Admitting: Allergy

## 2023-02-07 ENCOUNTER — Encounter: Payer: Self-pay | Admitting: Allergy

## 2023-02-07 DIAGNOSIS — L2389 Allergic contact dermatitis due to other agents: Secondary | ICD-10-CM

## 2023-02-07 NOTE — Patient Instructions (Signed)
Patch testing positive to: Cl-Me isothiazolinone Quaternium  Formaldehyde Diazolidinyl urea Tixocrotol Imidazolidinyl urea 2-bromo-2-nitroppropane

## 2023-02-09 ENCOUNTER — Encounter: Payer: Self-pay | Admitting: Allergy

## 2023-02-09 ENCOUNTER — Ambulatory Visit (INDEPENDENT_AMBULATORY_CARE_PROVIDER_SITE_OTHER): Payer: Medicare Other | Admitting: Allergy

## 2023-02-09 DIAGNOSIS — L2389 Allergic contact dermatitis due to other agents: Secondary | ICD-10-CM | POA: Diagnosis not present

## 2023-02-09 NOTE — Progress Notes (Signed)
Follow Up Note  RE: QUINTUS PREMO MRN: [id number] DOB: [birth year] Date of Office Visit: 02/09/2023  Referring provider: Melida Quitter, PA Primary care provider: Melida Quitter, PA  History of Present Illness: I had the pleasure of seeing Tommy Hobbs for a follow up visit at the [facility] on 02/09/2023. He is a 83 y.o. male, who is being followed for dermatitis. Today he is here for final patch test interpretation, given suspected history of contact dermatitis.  He is accompanied today by his spouse who provided/contributed to the history.   Diagnostics:  TRUE TEST 96 hour reading:   T.R.U.E. Test - 02/09/23 1000       Test Information   Time Antigen Placed 1038    Manufacturer ALK Abello    Location Back    Number of Test 36    Reading Interval Day 5    Panel Panel 1;Panel 2;Panel 3      Panel 1   1. Nickel Sulfate 0    2. Wool Alcohols 0    3. Neomycin Sulfate 0    4. Potassium Dichromate 0    5. Caine Mix 0    6. Fragrance Mix 0    7. Colophony 0    8. Paraben Mix 0    9. Negative Control 0    10. Balsam of [place] 0    11. Ethylenediamine Dihydrochloride 0    12. Cobalt Dichloride --   +/-     Panel 2   13. p-tert Butylphenol Formaldehyde Resin 0    14. Epoxy Resin 0    15. Carba Mix 0    16.  Black Rubber Mix 0    17. Cl+ Me-Isothiazolinone 1    18. Quaternium-15 1    19. Methyldibromo Glutaronitrile 0    20. p-Phenylenediamine 0    21. Formaldehyde 2    22. Mercapto Mix 0    23. Thimerosal --   +/-   24. Thiuram Mix 0   +/-     Panel 3   25. Diazolidinyl Urea 1    26. Quinoline Mix 0    27. Tixocortol-21-Pivalate 1    28. Gold Sodium Thiosulfate 0    29. Imidazolidinyl Urea --   +/-   30. Budesonide 0    31. Hydrocortisone-17-Butyrate 0    32. Mercaptobenzothiazole 0    33. Bacitracin 0    34. Parthenolide 0    35. Disperse Blue 106 0    36. 2-Bromo-2-Nitropropane-1,3-diol --   +/-             Assessment and  Plan: Dario is a 83 y.o. male with: Allergic contact dermatitis due to other agents Final TRUE patch test reading done today. Positive to: Cl+ Me-Isothiazolinone Quaternium-15 Formaldehyde Diazolidinyl Urea Tixocortol-21-Pivalate Borderline to: Cobalt Dichloride Thimerosal  Thiuram Mix Imidazolidinyl Urea 2-Bromo-2-Nitropropane-1,3-diol Avoid items that we gave a handout on. Email sent - regarding safe list. Only use items on the list.  Per Dr. Ellouise Newer last note - In the meantime, start Zyrtec (cetirizine) 10mg  twice daily to stay ahead of the itching.   Return in about 2 months (around 04/09/2023).  It was my pleasure to see Gearold today and participate in his care. Please feel free to contact me with any questions or concerns.  Sincerely,  Wyline Mood, DO Allergy & Immunology  [facility] office: [phone] [facility] office: [phone] [place] office: [phone]

## 2023-02-09 NOTE — Patient Instructions (Addendum)
Final patch test reading done today. Avoid items that we gave a handout on. Email sent - regarding safe list. Only use items on the list.  Per Dr. Ellouise Newer last note  - In the meantime, start Zyrtec (cetirizine) 10mg  twice daily to stay ahead of the itching.   Schedule follow up with Dr. Dellis Anes in 2 months.

## 2023-02-15 ENCOUNTER — Encounter: Payer: Self-pay | Admitting: Family Medicine

## 2023-02-17 ENCOUNTER — Encounter: Payer: Self-pay | Admitting: Family Medicine

## 2023-02-19 ENCOUNTER — Telehealth: Payer: Self-pay | Admitting: Allergy & Immunology

## 2023-02-19 NOTE — Telephone Encounter (Signed)
 Pt is requesting a call back about his itching.

## 2023-02-19 NOTE — Telephone Encounter (Signed)
 Noted! Thank you

## 2023-02-19 NOTE — Telephone Encounter (Signed)
 Patient called stating he is having severe itching and taking zyrtec twice a day is not working. Patient stated he wants to do shots for the itching. I put him on the schedule to see [name] for his itching. Patient stated Dr. Idolina Maker never spoke to him about starting shots, but wanted to be seen as soon as possible for his itching concern.

## 2023-02-19 NOTE — Patient Instructions (Addendum)
1. Rash/possible atopic dermatitis -  Environmental allergies, SPEP, thryroid antibodies, Tryptase, ESR, CMP, CRP, CUI ANA, and alpha gal unrevealing - continue  Zyrtec (cetirizine) 10mg  twice daily to stay ahead of the itching.  - Try Epiceram twice daily as needed to see if this helps. - Discussed Dupixent and nemluvio. He would like to start Dupixent today. Sample given in office - Dupixent consent signed -Tammy, our biologics coordinator, will be in contact you about getting approval with your insurance.  Please make sure to answer her phone calls.  If you are not able to get in touch with her her phone number is [phone] -I will order a chest x-ray due to your itching.We will call you with results. -declines dermatology referral for biopsy  Discussed Adverse Reactions including: Ophthalmic: Conjunctivitis (10%), eye pruritus (1%)  Rare side effects: Dermatologic: Herpes simplex infection (2%) Gastrointestinal: Oral herpes (4%) Immunologic: Antibody development (7%; neutralizing: 2%) Local: Injection site reaction (10%) allergic reaction, serum sickness, other eye problems  Discussed that of these side effects, ophthalmic is most common and to let us know if this develops with referral to ophthalmology  Discussed pt should avoid the use of live vaccines when using dupiluma  2.Allergic contact dermatitis due to other agents I will resend the email containing information that is free of the items yo showed up positive to. Please let us know if you are not able to get this email Final TRUE patch test reading done on 02/09/23. Positive to: Cl+ Me-Isothiazolinone Quaternium-15 Formaldehyde Diazolidinyl Urea Tixocortol-21-Pivalate Borderline to: Cobalt Dichloride Thimerosal  Thiuram Mix Imidazolidinyl Urea 2-Bromo-2-Nitropropane-1,3-diol Avoid items that we gave a handout on.  Schedule a follow up appointment in 4-6 weeks with Dr. Dellis Anes or sooner if needed

## 2023-02-20 ENCOUNTER — Encounter: Payer: Self-pay | Admitting: Family

## 2023-02-20 ENCOUNTER — Ambulatory Visit: Payer: Medicare Other | Admitting: Family

## 2023-02-20 ENCOUNTER — Other Ambulatory Visit: Payer: Self-pay

## 2023-02-20 VITALS — BP 120/60 | HR 73 | Temp 98.5°F | Ht 67.72 in | Wt 183.0 lb

## 2023-02-20 DIAGNOSIS — L299 Pruritus, unspecified: Secondary | ICD-10-CM | POA: Diagnosis not present

## 2023-02-20 DIAGNOSIS — L2089 Other atopic dermatitis: Secondary | ICD-10-CM

## 2023-02-20 DIAGNOSIS — L209 Atopic dermatitis, unspecified: Secondary | ICD-10-CM

## 2023-02-20 DIAGNOSIS — R21 Rash and other nonspecific skin eruption: Secondary | ICD-10-CM

## 2023-02-20 DIAGNOSIS — L2389 Allergic contact dermatitis due to other agents: Secondary | ICD-10-CM

## 2023-02-20 MED ORDER — DUPILUMAB 300 MG/2ML ~~LOC~~ SOSY
600.0000 mg | PREFILLED_SYRINGE | Freq: Once | SUBCUTANEOUS | Status: AC
  Administered 2023-02-20: 600 mg via SUBCUTANEOUS

## 2023-02-20 NOTE — Progress Notes (Signed)
[address] [place] [address] Dept: [phone]  FOLLOW UP NOTE  Patient ID: Tommy Hobbs, male    DOB: Oct 23, 1940  Age: 83 y.o. MRN: [id number] Date of Office Visit: 02/20/2023  Assessment  Chief Complaint: Eczema and Pruritus  HPI Tommy Hobbs is an 83 year old male who presents today for follow-up of allergic contact dermatitis due to other agents, rash, and other pruritic conditions.  He was last seen on February 09, 2023 by Dr. Selena Batten for patch test reading results..  He denies any new diagnosis or surgeries since his last office visit.  His wife is here with him today and helps provide history.  They deny any new diagnosis or surgery since his last office visit.  Allergic contact dermatitis: He reports that he never received an email containing items that are free of the materials he was allergic to on the patch test.  Instructed that we would resend the email and to let us know if he does not get it.  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  He reports that this surgery was many years ago.  Rash/pruritic condition: He reports that he continues to itch.  The itching is all over his body.  The itch started around 1 December.  He thinks that it started with itching and not a rash.  The areas can itch and burn.  He is not able to sleep.  No one else in the household has a rash.  He denies fevers, chills, joint pain, weight loss, and recent travel.  He reports that he is up-to-date on his cancer screenings.  He reports nothing really helps the itching.  He has tried triamcinolone, Sarna, Zyrtec twice a day and Benadryl.  He is not on any new medications and was not sick prior to this starting.  He denies any recent insect stings/bites.  He denies any new foods.  Discussed a possible referral to dermatology for biopsy and he declines at this time and mentions that he thought we were dermatology.  He is currently not using any lotion on his skin  due to being allergic to it all.  He is interested in starting an injection today.   Drug Allergies:  Allergies  Allergen Reactions   Other Hives and Rash    Pt has issues with certain ingredients in lotion.  imidazolidinyl urea, DMDM hydantoin, 2-bromo-2-nitropropane-1, diazolidinyl urea, quaternium 15, cocamidopropyl dimethyl glycine   Azolid [Phenylbutazone]    Diazolidurea-Iodopropbutylcarb    Quanterra [Ginkgo Biloba]    Cocamidopropyl Betaine Hives and Rash   Esomeprazole Magnesium Hives and Rash   Tegopen [Cloxacillin Sodium] Rash    Review of Systems: Negative except as per HPI   Physical Exam: BP 120/60   Pulse 73   Temp 98.5 F (36.9 C)   Ht 5' 7.72" (1.72 m)   Wt 183 lb (83 kg)   SpO2 99%   BMI 28.06 kg/m    Physical Exam Exam conducted with a chaperone present (wife present).  Constitutional:      Appearance: Normal appearance.  HENT:     Head: Normocephalic and atraumatic.     Comments: Pharynx normal, eyes normal, ears normal, nose normal    Right Ear: Tympanic membrane, ear canal and external ear normal.     Left Ear: Tympanic membrane, ear canal and external ear normal.     Nose: Nose normal.     Mouth/Throat:  Mouth: Mucous membranes are moist.     Pharynx: Oropharynx is clear.  Eyes:     Conjunctiva/sclera: Conjunctivae normal.  Cardiovascular:     Rate and Rhythm: Regular rhythm.     Heart sounds: Normal heart sounds.  Pulmonary:     Effort: Pulmonary effort is normal.     Breath sounds: Normal breath sounds.     Comments: Lungs clear to auscultation Musculoskeletal:     Cervical back: Neck supple.  Skin:    General: Skin is warm.     Comments: Erythema with small scabbing noted on bilateral forearms.  No oozing or bleeding noted.  Erythema noted on left lower and upper leg.  Neurological:     Mental Status: He is alert and oriented to person, place, and time.  Psychiatric:        Mood and Affect: Mood normal.        Behavior:  Behavior normal.        Thought Content: Thought content normal.        Judgment: Judgment normal.     Diagnostics:  None  Assessment and Plan: 1. Other atopic dermatitis   2. Allergic contact dermatitis due to other agents   3. Rash   4. Pruritic condition     Meds ordered this encounter  Medications   dupilumab (DUPIXENT) prefilled syringe 600 mg    Patient Instructions  1. Rash/possible atopic dermatitis -  Environmental allergies, SPEP, thryroid antibodies, Tryptase, ESR, CMP, CRP, CUI ANA, and alpha gal unrevealing - continue  Zyrtec (cetirizine) 10mg  twice daily to stay ahead of the itching.  - Try Epiceram twice daily as needed to see if this helps. - Discussed Dupixent and nemluvio. He would like to start Dupixent today. Sample given in office - Dupixent consent signed -Tammy, our biologics coordinator, will be in contact you about getting approval with your insurance.  Please make sure to answer her phone calls.  If you are not able to get in touch with her her phone number is [phone] -I will order a chest x-ray due to your itching.We will call you with results. -declines dermatology referral for biopsy  Discussed Adverse Reactions including: Ophthalmic: Conjunctivitis (10%), eye pruritus (1%)  Rare side effects: Dermatologic: Herpes simplex infection (2%) Gastrointestinal: Oral herpes (4%) Immunologic: Antibody development (7%; neutralizing: 2%) Local: Injection site reaction (10%) allergic reaction, serum sickness, other eye problems  Discussed that of these side effects, ophthalmic is most common and to let us know if this develops with referral to ophthalmology  Discussed pt should avoid the use of live vaccines when using dupiluma  2.Allergic contact dermatitis due to other agents I will resend the email containing information that is free of the items yo showed up positive to. Please let us know if you are not able to get this email Final TRUE patch  test reading done on 02/09/23. Positive to: Cl+ Me-Isothiazolinone Quaternium-15 Formaldehyde Diazolidinyl Urea Tixocortol-21-Pivalate Borderline to: Cobalt Dichloride Thimerosal  Thiuram Mix Imidazolidinyl Urea 2-Bromo-2-Nitropropane-1,3-diol Avoid items that we gave a handout on.  Schedule a follow up appointment in 4-6 weeks with Dr. Dellis Anes or sooner if needed       Return in about 6 weeks (around 04/03/2023), or if symptoms worsen or fail to improve.    Thank you for the opportunity to care for this patient.  Please do not hesitate to contact me with questions.  Nehemiah Settle, FNP [facility]

## 2023-02-20 NOTE — Progress Notes (Signed)
Immunotherapy   Patient Details  Name: Tommy Hobbs MRN: [id number] Date of Birth: 12/04/1940  02/20/2023  Drue Flirt Mahany started injections for  Dupixent   Frequency: Every 2 Weeks Epi-Pen: Not Required Consent signed and patient instructions given. Patient started Dupixent today and received Sample 600mg  loading dose. 300mg  in the LUA and RUA. Patient waited 15 minutes in office and did not experience any issues.   Lynniah Janoski Fernandez-Vernon 02/20/2023, 3:03 PM

## 2023-02-21 ENCOUNTER — Ambulatory Visit
Admission: RE | Admit: 2023-02-21 | Discharge: 2023-02-21 | Disposition: A | Discharge status: Home / Self Care | Payer: Medicare Other | Source: Ambulatory Visit | Attending: Family

## 2023-02-21 DIAGNOSIS — L299 Pruritus, unspecified: Secondary | ICD-10-CM | POA: Diagnosis not present

## 2023-02-21 NOTE — Progress Notes (Signed)
Please let Tommy Hobbs know that his chest x-ray shows no active cardiopulmonary disease. This is good news!

## 2023-02-27 ENCOUNTER — Telehealth: Payer: Self-pay | Admitting: *Deleted

## 2023-02-27 NOTE — Telephone Encounter (Signed)
 Thanks [employer]

## 2023-02-27 NOTE — Telephone Encounter (Signed)
 Called patient and discussed affordability with patient for Dupixent. Due to his income I believe he will qualify for PAP from [employer] [facility] and will send application for him to sign and return to me

## 2023-02-27 NOTE — Telephone Encounter (Signed)
-----   Message from Nehemiah Settle sent at 02/20/2023  2:35 PM EST ----- Patient  is interested in starting Dupixent  for atopic dermatitis

## 2023-02-28 ENCOUNTER — Telehealth: Payer: Self-pay

## 2023-02-28 NOTE — Telephone Encounter (Signed)
 This is being handle by Tammy from the [facility] at [facility] [address] Patient has been advised to call them

## 2023-02-28 NOTE — Telephone Encounter (Signed)
 Copied from CRM [id number]. Topic: Clinical - Medication Question >> Feb 27, 2023  3:02 PM Carlatta H wrote: Reason for CRM: Called patient and discussed affordability with patient for Dupixent. Due to his income I believe he will qualify for PAP from [facility] [facility] and will send application for him to sign and return to me//Please call patient back nurse tammy

## 2023-03-15 MED ORDER — DUPIXENT 300 MG/2ML ~~LOC~~ SOSY: 300.0000 mg | PREFILLED_SYRINGE | SUBCUTANEOUS | 11 refills | Status: DC

## 2023-03-15 NOTE — Addendum Note (Signed)
 Addended by: Devoria Glassing on: 03/15/2023 04:21 PM   Modules accepted: Orders

## 2023-03-19 NOTE — Telephone Encounter (Signed)
 Called patient and advised PAP approval and he will bring in next dose for admin instruction once he receives same

## 2023-03-22 ENCOUNTER — Other Ambulatory Visit: Payer: Medicare Other

## 2023-03-22 DIAGNOSIS — I10 Essential (primary) hypertension: Secondary | ICD-10-CM

## 2023-03-22 DIAGNOSIS — E782 Mixed hyperlipidemia: Secondary | ICD-10-CM | POA: Diagnosis not present

## 2023-03-23 LAB — CBC WITH DIFFERENTIAL/PLATELET
Basophils Absolute: 0 10*3/uL (ref 0.0–0.2)
Basos: 0 %
EOS (ABSOLUTE): 1.7 10*3/uL — ABNORMAL HIGH (ref 0.0–0.4)
Eos: 19 %
Hematocrit: 44 % (ref 37.5–51.0)
Hemoglobin: 14.8 g/dL (ref 13.0–17.7)
Immature Grans (Abs): 0 10*3/uL (ref 0.0–0.1)
Immature Granulocytes: 0 %
Lymphocytes Absolute: 2.2 10*3/uL (ref 0.7–3.1)
Lymphs: 24 %
MCH: 33.7 pg — ABNORMAL HIGH (ref 26.6–33.0)
MCHC: 33.6 g/dL (ref 31.5–35.7)
MCV: 100 fL — ABNORMAL HIGH (ref 79–97)
Monocytes Absolute: 0.7 10*3/uL (ref 0.1–0.9)
Monocytes: 7 %
Neutrophils Absolute: 4.5 10*3/uL (ref 1.4–7.0)
Neutrophils: 50 %
Platelets: 215 10*3/uL (ref 150–450)
RBC: 4.39 x10E6/uL (ref 4.14–5.80)
RDW: 11.6 % (ref 11.6–15.4)
WBC: 9.1 10*3/uL (ref 3.4–10.8)

## 2023-03-23 LAB — COMPREHENSIVE METABOLIC PANEL
ALT: 24 IU/L (ref 0–44)
AST: 22 IU/L (ref 0–40)
Albumin: 4.2 g/dL (ref 3.7–4.7)
Alkaline Phosphatase: 123 IU/L — ABNORMAL HIGH (ref 44–121)
BUN/Creatinine Ratio: 14 (ref 10–24)
BUN: 19 mg/dL (ref 8–27)
Bilirubin Total: 1 mg/dL (ref 0.0–1.2)
CO2: 22 mmol/L (ref 20–29)
Calcium: 9.4 mg/dL (ref 8.6–10.2)
Chloride: 102 mmol/L (ref 96–106)
Creatinine, Ser: 1.34 mg/dL — ABNORMAL HIGH (ref 0.76–1.27)
Globulin, Total: 2.2 g/dL (ref 1.5–4.5)
Glucose: 84 mg/dL (ref 70–99)
Potassium: 3.8 mmol/L (ref 3.5–5.2)
Sodium: 139 mmol/L (ref 134–144)
Total Protein: 6.4 g/dL (ref 6.0–8.5)
eGFR: 53 mL/min/{1.73_m2} — ABNORMAL LOW (ref >59)

## 2023-03-23 LAB — LIPID PANEL
Chol/HDL Ratio: 4 ratio (ref 0.0–5.0)
Cholesterol, Total: 141 mg/dL (ref 100–199)
HDL: 35 mg/dL — ABNORMAL LOW (ref >39)
LDL Chol Calc (NIH): 83 mg/dL (ref 0–99)
Triglycerides: 131 mg/dL (ref 0–149)
VLDL Cholesterol Cal: 23 mg/dL (ref 5–40)

## 2023-03-26 ENCOUNTER — Ambulatory Visit: Admitting: *Deleted

## 2023-03-26 DIAGNOSIS — L2089 Other atopic dermatitis: Secondary | ICD-10-CM

## 2023-03-26 MED ORDER — DUPILUMAB 300 MG/2ML ~~LOC~~ SOSY
300.0000 mg | PREFILLED_SYRINGE | Freq: Once | SUBCUTANEOUS | Status: AC
  Administered 2023-03-26: 300 mg via SUBCUTANEOUS

## 2023-03-27 ENCOUNTER — Ambulatory Visit: Payer: Medicare Other | Admitting: Family Medicine

## 2023-03-27 ENCOUNTER — Encounter: Payer: Self-pay | Admitting: Family Medicine

## 2023-03-27 ENCOUNTER — Ambulatory Visit (INDEPENDENT_AMBULATORY_CARE_PROVIDER_SITE_OTHER): Admitting: Family Medicine

## 2023-03-27 VITALS — BP 122/65 | HR 72 | Ht 67.72 in | Wt 181.0 lb

## 2023-03-27 DIAGNOSIS — E782 Mixed hyperlipidemia: Secondary | ICD-10-CM | POA: Diagnosis not present

## 2023-03-27 DIAGNOSIS — L239 Allergic contact dermatitis, unspecified cause: Secondary | ICD-10-CM | POA: Diagnosis not present

## 2023-03-27 DIAGNOSIS — D721 Eosinophilia, unspecified: Secondary | ICD-10-CM | POA: Diagnosis not present

## 2023-03-27 DIAGNOSIS — R7989 Other specified abnormal findings of blood chemistry: Secondary | ICD-10-CM | POA: Diagnosis not present

## 2023-03-27 DIAGNOSIS — I10 Essential (primary) hypertension: Secondary | ICD-10-CM

## 2023-03-27 NOTE — Progress Notes (Signed)
   Established Patient Office Visit  Subjective   Patient ID: Tommy Hobbs, male    DOB: [birth year]  Age: 83 y.o. MRN: [id number]  Chief Complaint  Patient presents with   Medical Management of Chronic Issues    HPI We discussed the patient lab work today, specifically his elevated creatinine, and his eosinophilia.  Patient recently saw the allergist and did a patch skin test and said he was "allergic to everything".  He started Dupixent yesterday.  Still has significant itching but uses triamcinolone and Sarna with menthol.  Patient still taking losartan, atorvastatin, pantoprazole.   The ASCVD Risk score (Arnett DK, et al., 2019) failed to calculate for the following reasons:   The 2019 ASCVD risk score is only valid for ages 44 to 13  Health Maintenance Due  Topic Date Due   Zoster Vaccines- Shingrix (1 of 2) Never done   COVID-19 Vaccine (3 - 2024-25 season) 09/10/2022      Objective:     BP 122/65   Pulse 72   Ht 5' 7.72" (1.72 m)   Wt 181 lb (82.1 kg)   SpO2 100%   BMI 27.75 kg/m    Physical Exam General: Alert, oriented CV: Regular rate and rhythm Pulmonary: Lungs clear bilaterally Skin: Mild diffuse erythema, faint pink in color, over the legs and arms.  Persistent itching during the exam.   No results found for any visits on 03/27/23.      Assessment & Plan:   Essential hypertension Assessment & Plan: At goal, kidney function normal.  Continue losartan 50 mg.   Elevated serum creatinine -     Basic metabolic panel -     Cystatin C  Eosinophilia, unspecified type -     CBC with Differential/Platelet  Allergic dermatitis Assessment & Plan: Has seen allergy specialist and recently started Dupixent yesterday.  Continue with topicals as needed.  Recent eosinophilia seen, will repeat CBC with differential.  Recent SPEP was normal   Mixed hyperlipidemia Assessment & Plan: Continue max dose atorvastatin.  Cholesterol at  goal.      Return in about 6 months (around 09/27/2023) for HTN, hld.    Sandre Kitty, MD

## 2023-03-27 NOTE — Assessment & Plan Note (Signed)
 Continue max dose atorvastatin.  Cholesterol at goal.

## 2023-03-27 NOTE — Assessment & Plan Note (Signed)
 Has seen allergy specialist and recently started Dupixent yesterday.  Continue with topicals as needed.  Recent eosinophilia seen, will repeat CBC with differential.  Recent SPEP was normal

## 2023-03-27 NOTE — Assessment & Plan Note (Signed)
 At goal, kidney function normal.  Continue losartan 50 mg.

## 2023-03-27 NOTE — Patient Instructions (Signed)
 It was nice to see you today,  We addressed the following topics today: -I am going to recheck your kidney function and CBC.  I will let you know the results when I get them and if there is any further testing that needs to be done afterwards - We will see back in 6 months or sooner if you need to see [place] for an acute issue.  Have a great day,  Frederic Jericho, MD

## 2023-03-28 LAB — CBC WITH DIFFERENTIAL/PLATELET
Basophils Absolute: 0 10*3/uL (ref 0.0–0.2)
Basos: 0 %
EOS (ABSOLUTE): 1 10*3/uL — ABNORMAL HIGH (ref 0.0–0.4)
Eos: 11 %
Hematocrit: 43.6 % (ref 37.5–51.0)
Hemoglobin: 14.8 g/dL (ref 13.0–17.7)
Immature Grans (Abs): 0 10*3/uL (ref 0.0–0.1)
Immature Granulocytes: 0 %
Lymphocytes Absolute: 2.1 10*3/uL (ref 0.7–3.1)
Lymphs: 23 %
MCH: 33.6 pg — ABNORMAL HIGH (ref 26.6–33.0)
MCHC: 33.9 g/dL (ref 31.5–35.7)
MCV: 99 fL — ABNORMAL HIGH (ref 79–97)
Monocytes Absolute: 0.8 10*3/uL (ref 0.1–0.9)
Monocytes: 8 %
Neutrophils Absolute: 5.4 10*3/uL (ref 1.4–7.0)
Neutrophils: 58 %
Platelets: 204 10*3/uL (ref 150–450)
RBC: 4.4 x10E6/uL (ref 4.14–5.80)
RDW: 11.7 % (ref 11.6–15.4)
WBC: 9.4 10*3/uL (ref 3.4–10.8)

## 2023-03-28 LAB — CYSTATIN C: CYSTATIN C: 1.28 mg/L — ABNORMAL HIGH (ref 0.87–1.12)

## 2023-03-28 LAB — BASIC METABOLIC PANEL
BUN/Creatinine Ratio: 12 (ref 10–24)
BUN: 15 mg/dL (ref 8–27)
CO2: 22 mmol/L (ref 20–29)
Calcium: 9.5 mg/dL (ref 8.6–10.2)
Chloride: 101 mmol/L (ref 96–106)
Creatinine, Ser: 1.24 mg/dL (ref 0.76–1.27)
Glucose: 96 mg/dL (ref 70–99)
Potassium: 3.9 mmol/L (ref 3.5–5.2)
Sodium: 139 mmol/L (ref 134–144)
eGFR: 58 mL/min/{1.73_m2} — ABNORMAL LOW (ref >59)

## 2023-04-10 ENCOUNTER — Encounter: Payer: Self-pay | Admitting: Allergy & Immunology

## 2023-04-10 ENCOUNTER — Ambulatory Visit (INDEPENDENT_AMBULATORY_CARE_PROVIDER_SITE_OTHER): Admitting: Allergy & Immunology

## 2023-04-10 ENCOUNTER — Ambulatory Visit: Payer: Medicare Other | Admitting: Allergy & Immunology

## 2023-04-10 ENCOUNTER — Other Ambulatory Visit: Payer: Self-pay

## 2023-04-10 VITALS — BP 136/68 | HR 76 | Temp 98.3°F | Resp 12

## 2023-04-10 DIAGNOSIS — M795 Residual foreign body in soft tissue: Secondary | ICD-10-CM

## 2023-04-10 NOTE — Progress Notes (Signed)
 FOLLOW UP  Date of Service/Encounter:  04/10/23   Assessment:   Pruritus and rash - improved with Dupixent  Foreign body (FB) in soft tissue - checking AXR to be sure  Contact dermatitis to multiple items (Cl+ Me-Isothiazolinone, Quaternium-15, Formaldehyde, Diazolidinyl Urea, Tixocortol-21-Pivalate, Cobalt Dichloride, Thimerosal, Thiuram Mix, Imidazolidinyl Urea, and 2-Bromo-2-Nitropropane-1,3-diol)  Plan/Recommendations:   1. Rash/possible atopic dermatitis - We will continue with the Dupixent as you were doing before.  - Continue with the Zyrtec (cetirizine) 10mg  twice daily to stay ahead of the itching.  - I am glad that we are getting this medication covered.  - We are going to order an abdominal X-ray to see if there is a needle in there.    2.Allergic contact dermatitis due to other agents Final TRUE patch test reading done on 02/09/23: Positive to: Cl+ Me-Isothiazolinone Quaternium-15 Formaldehyde Diazolidinyl Urea Tixocortol-21-Pivalate Borderline to: Cobalt Dichloride Thimerosal  Thiuram Mix Imidazolidinyl Urea 2-Bromo-2-Nitropropane-1,3-diol Avoid items above.    Return in about 3 months (around 07/10/2023). You can have the follow up appointment with Dr. Dellis Anes or a Nurse Practicioner (our Nurse Practitioners are excellent and always have Physician oversight!).   Subjective:   Tommy Hobbs is a 83 y.o. 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   Allergic dermatitis 12/27/2022   Benign neoplasm of colon 06/16/2021   Elevated PSA 06/16/2021   Hypertrophy of prostate without urinary obstruction and other lower urinary tract symptoms (LUTS) 06/16/2021   Osteoarthritis 06/16/2021   Prostatitis, chronic 06/16/2021   Ventral hernia  06/16/2021   Vitamin D deficiency 06/16/2021   Overweight (BMI 25.0-29.9) 08/30/2017   Essential hypertension 08/30/2017   Mixed hyperlipidemia 05/02/2017   GERD (gastroesophageal reflux disease) 01/25/2017   h/o Dysrhythmia 01/25/2017   History of BPH 01/25/2017   DDD (degenerative disc disease) 01/25/2017   h/o PTSD (post-traumatic stress disorder)- served in [place] War 01/25/2017   Left inguinal hernia 07/17/2013   Other ill-defined and unknown causes of morbidity and mortality 01/09/1969    History obtained from: chart review and patient and his wife .  Discussed the use of AI scribe software for clinical note transcription with the patient and/or guardian, who gave verbal consent to proceed.  Tommy Hobbs is a 83 y.o. male presenting for a follow up visit.  He was last seen in February 2025.  At that time, he saw Nehemiah Settle, one of our nurse practitioners.  At that time, he was still itching quite a bit on Zyrtec twice a day.  He had already had a thorough evaluation that included environmental allergy testing, SPEP, thyroid antibodies, tryptase, inflammatory markers, metabolic panel, chronic urticaria index, ANA, and alpha gal which were all normal.  He did decide to start Dupixent and got a sample in the office.  He was approved for it by our biologics coordinator.  For his contact dermatitis, he continued to avoid all of his positive triggers.  Since last visit, he has done much better.  During his second self-administered Dupixent injection at home, the needle bent, and he attempted to straighten it before completing the injection. After the injection, he was unable to locate the needle and is concerned it may be in his abdomen. No tenderness is reported at the injection site.  He actually has not other concern, his wife however is very concerned.  He has been on Dupixent, which he administers at home, and reports improvement in his condition since starting the medication. His leg  symptoms are not as severe as before, although he still experiences some issues. The medication is covered through an alternative program as his insurance does not cover it.  Overall, he is sleeping better without as much itching.  His wife is very happy with how he is doing.  He has a history of multiple allergies, including a noted allergy to titanium, identified through patch testing. He has a titanium plate in his neck with eight screws. 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  No tenderness at the injection site, although he mentions having 'knots all over' his body. He is hard of hearing, which sometimes affects communication.  Otherwise, there have been no changes to his past medical history, surgical history, family history, or social history.    Review of systems otherwise negative other than that mentioned in the HPI.    Objective:   Blood pressure 136/68, pulse 76, temperature 98.3 F (36.8 C), temperature source Temporal, resp. rate 12, SpO2 96%. There is no height or weight on file to calculate BMI.    Physical Exam Vitals reviewed.  Constitutional:      Appearance: He is well-developed.  HENT:     Head: Normocephalic and atraumatic.     Right Ear: Tympanic membrane, ear canal and external ear normal. No drainage, swelling or tenderness. Tympanic membrane is not injected, scarred, erythematous, retracted or bulging.     Left Ear: Tympanic membrane, ear canal and external ear normal. No drainage, swelling or tenderness. Tympanic membrane is not injected, scarred, erythematous, retracted or bulging.     Nose: No nasal deformity, septal deviation, mucosal edema or rhinorrhea.     Right Turbinates: Enlarged and swollen.     Left Turbinates: Enlarged and swollen.     Right Sinus: No maxillary sinus tenderness or frontal sinus tenderness.     Left Sinus: No maxillary  sinus tenderness or frontal sinus tenderness.     Mouth/Throat:     Mouth: Mucous membranes are not pale and not dry.     Pharynx: Uvula midline.  Eyes:     General:        Right eye: No discharge.        Left eye: No discharge.     Conjunctiva/sclera: Conjunctivae normal.     Right eye: Right conjunctiva is not injected. No chemosis.    Left eye: Left conjunctiva is not injected. No chemosis.    Pupils: Pupils are equal, round, and reactive to light.  Cardiovascular:     Rate and Rhythm: Normal rate and regular rhythm.     Heart sounds: Normal heart sounds.  Pulmonary:     Effort: Pulmonary effort is normal. No tachypnea, accessory muscle usage or respiratory distress.     Breath sounds: Normal breath sounds. No wheezing, rhonchi or rales.  Chest:     Chest wall: No tenderness.  Abdominal:     Tenderness: There is no abdominal tenderness. There is no guarding or rebound.     Comments: He does have a very tiny red area around where his injection took place.  No tenderness appreciated around it.  I do not appreciate any foreign bodies within the area.  No bleeding or oozing  noted.  Lymphadenopathy:     Head:     Right side of head: No submandibular, tonsillar or occipital adenopathy.     Left side of head: No submandibular, tonsillar or occipital adenopathy.     Cervical: No cervical adenopathy.  Skin:    General: Skin is warm.     Capillary Refill: Capillary refill takes less than 2 seconds.     Coloration: Skin is not pale.     Findings: No abrasion, erythema, lesion, petechiae or rash. Rash is not papular, urticarial or vesicular.     Comments: Skin looks much better.  Neurological:     Mental Status: He is alert.  Psychiatric:        Behavior: Behavior is cooperative.      Diagnostic studies:  none      Malachi Bonds, MD  [facility]

## 2023-04-10 NOTE — Patient Instructions (Addendum)
 1. Rash/possible atopic dermatitis - We will continue with the Dupixent as you were doing before.  - Continue with the Zyrtec (cetirizine) 10mg  twice daily to stay ahead of the itching.  - I am glad that we are getting this medication covered.  - We are going to order an abdominal X-ray to see if there is a needle in there.    2.Allergic contact dermatitis due to other agents Final TRUE patch test reading done on 02/09/23: Positive to: Cl+ Me-Isothiazolinone Quaternium-15 Formaldehyde Diazolidinyl Urea Tixocortol-21-Pivalate Borderline to: Cobalt Dichloride Thimerosal  Thiuram Mix Imidazolidinyl Urea 2-Bromo-2-Nitropropane-1,3-diol Avoid items above.    Return in about 3 months (around 07/10/2023). You can have the follow up appointment with Dr. Dellis Anes or a Nurse Practicioner (our Nurse Practitioners are excellent and always have Physician oversight!).    Please inform us of any Emergency Department visits, hospitalizations, or changes in symptoms. Call us before going to the ED for breathing or allergy symptoms since we might be able to fit you in for a sick visit. Feel free to contact us anytime with any questions, problems, or concerns.  It was a pleasure to see you and your family again today!  Websites that have reliable patient information: 1. American Academy of Asthma, Allergy, and Immunology: www.aaaai.org 2. Food Allergy Research and Education (FARE): foodallergy.org 3. Mothers of Asthmatics: http://www.asthmacommunitynetwork.org 4. American College of Allergy, Asthma, and Immunology: www.acaai.org      "Like" [place] on Facebook and Instagram for our latest updates!      A healthy democracy works best when [employer] participate! Make sure you are registered to vote! If you have moved or changed any of your contact information, you will need to get this updated before voting! Scan the QR codes below to learn more!

## 2023-04-11 ENCOUNTER — Ambulatory Visit
Admission: RE | Admit: 2023-04-11 | Discharge: 2023-04-11 | Disposition: A | Discharge status: Home / Self Care | Source: Ambulatory Visit | Attending: Allergy & Immunology

## 2023-04-11 DIAGNOSIS — Z03823 Encounter for observation for suspected inserted (injected) foreign body ruled out: Secondary | ICD-10-CM | POA: Diagnosis not present

## 2023-04-11 DIAGNOSIS — M795 Residual foreign body in soft tissue: Secondary | ICD-10-CM

## 2023-04-13 ENCOUNTER — Telehealth: Payer: Self-pay | Admitting: Allergy & Immunology

## 2023-04-13 ENCOUNTER — Telehealth: Payer: Self-pay

## 2023-04-13 NOTE — Telephone Encounter (Signed)
 Spoke with patient--DOB verified--informed him that Dr. Dellis Anes has not reviewed his X-Ray. He was told that someone from the office will call him with the results once it has been read. Verbalized understanding.

## 2023-04-13 NOTE — Telephone Encounter (Signed)
 Patient called and stated that ge=he would like a call back to go over his xray results. Patients call back number is [phone]

## 2023-04-23 ENCOUNTER — Other Ambulatory Visit: Payer: Self-pay | Admitting: Family Medicine

## 2023-04-23 DIAGNOSIS — K219 Gastro-esophageal reflux disease without esophagitis: Secondary | ICD-10-CM

## 2023-05-24 ENCOUNTER — Ambulatory Visit: Payer: Self-pay

## 2023-05-24 ENCOUNTER — Telehealth: Payer: Self-pay

## 2023-05-24 ENCOUNTER — Other Ambulatory Visit: Payer: Self-pay

## 2023-05-24 ENCOUNTER — Emergency Department (HOSPITAL_COMMUNITY): Admission: EM | Admit: 2023-05-24 | Discharge: 2023-05-24 | Disposition: A | Discharge status: Home / Self Care

## 2023-05-24 DIAGNOSIS — R471 Dysarthria and anarthria: Secondary | ICD-10-CM | POA: Diagnosis not present

## 2023-05-24 DIAGNOSIS — R3915 Urgency of urination: Secondary | ICD-10-CM | POA: Insufficient documentation

## 2023-05-24 DIAGNOSIS — I10 Essential (primary) hypertension: Secondary | ICD-10-CM | POA: Insufficient documentation

## 2023-05-24 DIAGNOSIS — R350 Frequency of micturition: Secondary | ICD-10-CM | POA: Insufficient documentation

## 2023-05-24 DIAGNOSIS — Z79899 Other long term (current) drug therapy: Secondary | ICD-10-CM | POA: Diagnosis not present

## 2023-05-24 DIAGNOSIS — R4701 Aphasia: Secondary | ICD-10-CM | POA: Diagnosis present

## 2023-05-24 LAB — CBC WITH DIFFERENTIAL/PLATELET
Abs Immature Granulocytes: 0.01 10*3/uL (ref 0.00–0.07)
Basophils Absolute: 0 10*3/uL (ref 0.0–0.1)
Basophils Relative: 0 %
Eosinophils Absolute: 0.6 10*3/uL — ABNORMAL HIGH (ref 0.0–0.5)
Eosinophils Relative: 9 %
HCT: 42.6 % (ref 39.0–52.0)
Hemoglobin: 14.3 g/dL (ref 13.0–17.0)
Immature Granulocytes: 0 %
Lymphocytes Relative: 35 %
Lymphs Abs: 2.4 10*3/uL (ref 0.7–4.0)
MCH: 32.8 pg (ref 26.0–34.0)
MCHC: 33.6 g/dL (ref 30.0–36.0)
MCV: 97.7 fL (ref 80.0–100.0)
Monocytes Absolute: 0.7 10*3/uL (ref 0.1–1.0)
Monocytes Relative: 10 %
Neutro Abs: 3.1 10*3/uL (ref 1.7–7.7)
Neutrophils Relative %: 46 %
Platelets: 167 10*3/uL (ref 150–400)
RBC: 4.36 MIL/uL (ref 4.22–5.81)
RDW: 11.9 % (ref 11.5–15.5)
WBC: 6.8 10*3/uL (ref 4.0–10.5)
nRBC: 0 % (ref 0.0–0.2)

## 2023-05-24 LAB — URINALYSIS, ROUTINE W REFLEX MICROSCOPIC
Bilirubin Urine: NEGATIVE
Glucose, UA: NEGATIVE mg/dL
Ketones, ur: 5 mg/dL — AB
Nitrite: NEGATIVE
Protein, ur: 100 mg/dL — AB
Specific Gravity, Urine: 1.02 (ref 1.005–1.030)
WBC, UA: >50 WBC/hpf (ref 0–5)
pH: 6 (ref 5.0–8.0)

## 2023-05-24 LAB — BASIC METABOLIC PANEL WITH GFR
Anion gap: 7 (ref 5–15)
BUN: 24 mg/dL — ABNORMAL HIGH (ref 8–23)
CO2: 25 mmol/L (ref 22–32)
Calcium: 8.7 mg/dL — ABNORMAL LOW (ref 8.9–10.3)
Chloride: 105 mmol/L (ref 98–111)
Creatinine, Ser: 0.7 mg/dL (ref 0.61–1.24)
GFR, Estimated: >60 mL/min (ref >60)
Glucose, Bld: 125 mg/dL — ABNORMAL HIGH (ref 70–99)
Potassium: 3.8 mmol/L (ref 3.5–5.1)
Sodium: 137 mmol/L (ref 135–145)

## 2023-05-24 LAB — CBG MONITORING, ED: Glucose-Capillary: 111 mg/dL — ABNORMAL HIGH (ref 70–99)

## 2023-05-24 NOTE — Discharge Instructions (Signed)
 You may go to [facility] as they are ready for you there if you decide to go tonight. I would come back tomorrow to have this done here in the emergency department.

## 2023-05-24 NOTE — Telephone Encounter (Signed)
 I did discuss this with Tommy Hobbs and [name].  I have never heard of Dupixent  causing any speech issues.  Because of his age, I recommended that he go to the emergency room in case he is having a stroke.

## 2023-05-24 NOTE — ED Triage Notes (Signed)
 Pt complaining of slurred speech that has been going on for several weeks. Pt was told by primary provider to report to the ED. Pt is alert and oriented x4. No signs of weakness or confusion.

## 2023-05-24 NOTE — Telephone Encounter (Signed)
 Per Provider:  Please have him go to the nearest ER/ED for further evaluation.  Called patient - DOB/NEED DPR verified - unable to LMOVM, per recording, the voicemail is full, please try our call again later.  Tried calling patient's spouse, Leola Raisin - LMOVM regarding provider's notation.  Forwarding updated message to provider.

## 2023-05-24 NOTE — Telephone Encounter (Signed)
 Patient called and stated that he has been self administering Dupixent  since March 2025. He stated a few weeks ago he noticed it starting to affect his speech and can't talk or get his words out. Patient is wondering if he needs to decrease his Dupixent  dose or if there was something else he could do. Patient denied any pain or discomfort and stated he isn't having any breathing problems just speech delay. Patient has not went to his primary care regarding this matter. Please advise.

## 2023-05-24 NOTE — ED Provider Notes (Signed)
 [place] EMERGENCY DEPARTMENT AT [facility] Provider Note   CSN: [id number] Arrival date & time: 05/24/23  1857     History  Chief Complaint  Patient presents with   Aphasia    Tommy Hobbs is a 83 y.o. male.  83 year old male with past medical history of hypertension hyperlipidemia presenting to the emergency department today with slurred speech.  This been going on now for the past week or so.  The patient states that he thinks that this is due to starting Dupixent  recently.  He states that he has been on this for about 5 weeks.  He denies any focal weakness, numbness, or tingling.  He states that he is otherwise feeling well.  He does report some urinary frequency and urgency but states that this is not totally out of the ordinary for him.  He came to the ER today after calling his doctor and being referred here for further evaluation.        Home Medications Prior to Admission medications   Medication Sig Start Date End Date Taking? Authorizing Provider  Aspirin-Acetaminophen  (GOODYS BODY PAIN PO) Take 1 packet by mouth as needed (pain).    [provider]  atorvastatin  (LIPITOR) 80 MG tablet TAKE 1 TABLET BY MOUTH DAILY 01/22/23   Noreene Bearded, PA  Dermatological Products, Misc. [facility]) lotion Apply 1 Application topically 2 (two) times daily as needed. 01/23/23   Rochester Chuck, MD  dupilumab  (DUPIXENT ) 300 MG/2ML prefilled syringe Inject 300 mg into the skin every 14 (fourteen) days. 03/15/23   Tinnie Forehand, FNP  losartan  (COZAAR ) 50 MG tablet TAKE 1 TABLET BY MOUTH DAILY 10/16/22   Maryclare Smoke A, PA  pantoprazole  (PROTONIX ) 40 MG tablet TAKE 1 TABLET BY MOUTH DAILY 04/25/23   Olson, Daniel K, MD  Psyllium (VEGETABLE LAXATIVE PO) Take 1 tablet by mouth daily as needed. [name]    [provider]  triamcinolone  ointment (KENALOG ) 0.1 % Apply 1 Application topically 2 (two) times daily. 01/01/23   Laneta Pintos, MD       Allergies    Other, Azolid [phenylbutazone], Diazolidurea-iodopropbutylcarb, Quanterra [ginkgo biloba], Cocamidopropyl betaine, Esomeprazole magnesium , and Tegopen [cloxacillin sodium]    Review of Systems   Review of Systems  Neurological:  Positive for speech difficulty.  All other systems reviewed and are negative.   Physical Exam Updated Vital Signs BP (!) 186/75   Pulse 78   Temp 97.8 F (36.6 C) (Oral)   Resp 17   Ht 5\' 7"  (1.702 m)   Wt 82 kg   SpO2 98%   BMI 28.31 kg/m  Physical Exam Gen: NAD Eyes: PERRL, EOMI HEENT: no oropharyngeal swelling Neck: trachea midline Resp: clear to auscultation bilaterally Card: RRR, no murmurs, rubs, or gallops Abd: nontender, nondistended Extremities: no calf tenderness, no edema Vascular: 2+ radial pulses bilaterally, 2+ DP pulses bilaterally Neuro: NIH stroke scale of 1 for dysarthria Skin: no rashes Psyc: acting appropriately  ED Results / Procedures / Treatments   Labs (all labs ordered are listed, but only abnormal results are displayed) Labs Reviewed  CBC WITH DIFFERENTIAL/PLATELET - Abnormal; Notable for the following components:      Result Value   Eosinophils Absolute 0.6 (*)    All other components within normal limits  BASIC METABOLIC PANEL WITH GFR - Abnormal; Notable for the following components:   Glucose, Bld 125 (*)    BUN 24 (*)    Calcium  8.7 (*)  All other components within normal limits  CBG MONITORING, ED - Abnormal; Notable for the following components:   Glucose-Capillary 111 (*)    All other components within normal limits  URINALYSIS, ROUTINE W REFLEX MICROSCOPIC    EKG EKG Interpretation Date/Time:  Thursday May 24 2023 19:02:54 EDT Ventricular Rate:  74 PR Interval:  157 QRS Duration:  108 QT Interval:  402 QTC Calculation: 446 R Axis:   75  Text Interpretation: Sinus rhythm Atrial premature complex Confirmed by Abner Hoffman [phone]) on 05/24/2023 7:03:51 PM  Radiology No results  found.  Procedures Procedures    Medications Ordered in ED Medications - No data to display  ED Course/ Medical Decision Making/ A&P                                 Medical Decision Making 83 year old male with past medical history of hypertension hyperlipidemia presenting to the emergency department today with dysarthria.  I will further evaluate the patient here with basic labs to evaluate for anemia or electrolyte abnormalities.  Given his urinary symptoms also obtain a urinalysis.  Will obtain MRI of his brain to evaluate for CVA.  I will reevaluate for ultimate disposition.  The patient's initial labs are reassuring.  We do not have MRI capability this evening.  The patient was accepted for ED to ED transfer by Dr. Gordon Latus.  I discussed this with the patient and his wife.  They are going to decide if they would like to go to [facility] or come back tomorrow morning.  I did explain to the patient that with the symptoms going on now for close to 1 week I would likely not be any acute intervention tonight but that further stroke workup would be warranted if there has been a stroke.  Amount and/or Complexity of Data Reviewed Labs: ordered. Radiology: ordered.           Final Clinical Impression(s) / ED Diagnoses Final diagnoses:  Dysarthria    Rx / DC Orders ED Discharge Orders     None         Carin Charleston, MD 05/24/23 2146

## 2023-05-24 NOTE — Telephone Encounter (Signed)
 Spoke with patient, informed him to go to the nearest emergency room for further evaluation as his symptoms may not be related to the Dupixent . Patient verbalized understanding and is getting ready to head there after he gets dressed.

## 2023-05-28 NOTE — Telephone Encounter (Signed)
 Spoke with patient and wife, patient is doing much better and his slurred speech is subsiding. Patient stated he does not want to continue with Dupixent  as he feels this was the cause of his slurred speech. Please advise

## 2023-05-29 NOTE — Telephone Encounter (Signed)
 That is fine with me.

## 2023-05-30 NOTE — Telephone Encounter (Signed)
 Noted

## 2023-06-28 ENCOUNTER — Other Ambulatory Visit: Payer: Self-pay | Admitting: Family Medicine

## 2023-06-28 DIAGNOSIS — I10 Essential (primary) hypertension: Secondary | ICD-10-CM

## 2023-07-10 ENCOUNTER — Other Ambulatory Visit: Payer: Self-pay

## 2023-07-10 ENCOUNTER — Encounter: Payer: Self-pay | Admitting: Allergy & Immunology

## 2023-07-10 ENCOUNTER — Ambulatory Visit: Admitting: Allergy & Immunology

## 2023-07-10 VITALS — BP 136/62 | HR 70 | Temp 98.1°F | Resp 16 | Ht 68.9 in | Wt 174.3 lb

## 2023-07-10 DIAGNOSIS — L2089 Other atopic dermatitis: Secondary | ICD-10-CM

## 2023-07-10 DIAGNOSIS — L2389 Allergic contact dermatitis due to other agents: Secondary | ICD-10-CM

## 2023-07-10 MED ORDER — TRIAMCINOLONE ACETONIDE 0.1 % EX OINT: 1.0000 | TOPICAL_OINTMENT | Freq: Two times a day (BID) | CUTANEOUS | 0 refills | Status: AC

## 2023-07-10 MED ORDER — TRIAMCINOLONE ACETONIDE 0.1 % EX CREA: 1.0000 | TOPICAL_CREAM | Freq: Two times a day (BID) | CUTANEOUS | 0 refills | Status: AC

## 2023-07-10 NOTE — Progress Notes (Signed)
 FOLLOW UP  Date of Service/Encounter:  07/10/23   Assessment:   Pruritus and rash - improved with Dupixent , but stopped Dupixent  due to hoarse voice   Foreign body (FB) in soft tissue - checking AXR to be sure   Contact dermatitis to multiple items (Cl+ Me-Isothiazolinone, Quaternium-15, Formaldehyde, Diazolidinyl Urea, Tixocortol-21-Pivalate, Cobalt Dichloride, Thimerosal, Thiuram Mix, Imidazolidinyl Urea, and 2-Bromo-2-Nitropropane-1,3-diol)  Plan/Recommendations:   1. Rash/possible atopic dermatitis - It seems that are good without the Dupixent , so we are not going to make any changes at all today.  - Continue with the Zyrtec (cetirizine) 10mg  twice daily to stay ahead of the itching.  - I am glad that we are getting this medication covered.  - We will send in triamcinolone  ointment to use twice daily as needed. - We will send in triamcinolone  cream to use twice daily as needed.    2.Allergic contact dermatitis due to other agents Final TRUE patch test reading done on 02/09/23: Positive to: Cl+ Me-Isothiazolinone Quaternium-15 Formaldehyde Diazolidinyl Urea Tixocortol-21-Pivalate Borderline to: Cobalt Dichloride Thimerosal  Thiuram Mix Imidazolidinyl Urea 2-Bromo-2-Nitropropane-1,3-diol Avoid items above.    Return in about 6 months (around 01/10/2024). You can have the follow up appointment with Dr. Iva or a Nurse Practicioner (our Nurse Practitioners are excellent and always have Physician oversight!).    Subjective:   Tommy Hobbs is a 83 y.o. 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  Discussed the use of AI scribe software for clinical note transcription with the patient and/or guardian, who gave verbal consent to proceed.  Tommy Hobbs is a 83 y.o. male presenting for a follow up visit.  He was last seen in April 2025.  At that time, he was doing well with Dupixent .  We also continue with Zyrtec 10 mg up to twice daily to stay ahead of the itching.  We ordered an abdominal x-ray because there was concern that the needle was stuck in there, but this was negative.  He had undergone patch testing that showed multiple reactions.  Since the last visit, he has done really well.   Allergic Rhinitis Symptom History: He mentions a significant amount of ear wax, which has been addressed by a medical professional in the past. He recalls being told he did not need a hearing aid, just a cleaning of the wax.  Skin Symptom History: He has been experiencing ongoing skin issues, including dry  spots, for which he uses triamcinolone  ointment and cream. These treatments have been used for over twenty years, initially prescribed when he first started experiencing skin problems He previously used Dupixent  injections but discontinued them due to side effects affecting his voice.  His last injection was in late April, and his speech has not returned to normal as of July. He is happy with how well he is doing. He is fine not starting any new medications at this point in time. He is wondering about getting some triamcinolone  cream AND triamcinolone  ointment. He has a history of allergies to various substances, which he describes as extensive, but notes that his children do not share these allergies. No family history of similar allergies.   Otherwise, there have been no changes to his past medical history, surgical history, family history, or social history.    Review of systems otherwise negative other than that mentioned in the HPI.    Objective:   Blood pressure 136/62, pulse 70, temperature 98.1 F (36.7 C), temperature source Temporal, resp. rate 16, height 5' 8.9 (1.75 m), weight 174 lb 4.8 oz (79.1 kg), SpO2 98%. Body mass index is 25.82 kg/m.    Physical Exam Vitals reviewed.  Constitutional:      Appearance: He is well-developed.  HENT:     Head: Normocephalic and atraumatic.     Right Ear: Tympanic membrane, ear canal and external ear normal. No drainage, swelling or tenderness. Tympanic membrane is not injected, scarred, erythematous, retracted or bulging.     Left Ear: Tympanic membrane, ear canal and external ear normal. No drainage, swelling or tenderness. Tympanic membrane is not injected, scarred, erythematous, retracted or bulging.     Nose: No nasal deformity, septal deviation, mucosal edema or rhinorrhea.     Right Turbinates: Enlarged and swollen.     Left Turbinates: Enlarged and swollen.     Right Sinus: No maxillary sinus tenderness or frontal sinus tenderness.     Left Sinus: No maxillary sinus tenderness or frontal sinus tenderness.     Mouth/Throat:     Mouth: Mucous membranes are not pale and not dry.     Pharynx: Uvula midline.   Eyes:     General:        Right eye: No discharge.        Left eye: No discharge.      Conjunctiva/sclera: Conjunctivae normal.     Right eye: Right conjunctiva is not injected. No chemosis.    Left eye: Left conjunctiva is not injected. No chemosis.    Pupils: Pupils are equal, round, and reactive to light.    Cardiovascular:     Rate and Rhythm: Normal rate and regular rhythm.     Heart sounds: Normal heart sounds.  Pulmonary:     Effort: Pulmonary effort is normal. No tachypnea, accessory muscle usage or respiratory distress.     Breath sounds: Normal breath sounds. No wheezing, rhonchi or rales.  Chest:     Chest wall: No tenderness.  Abdominal:     Tenderness: There is no abdominal tenderness. There is no guarding or rebound.     Comments: He does have a very tiny red area around where his injection took place.  No tenderness appreciated around it.  I do not appreciate any foreign bodies within the area.  No bleeding or oozing noted.  Lymphadenopathy:     Head:     Right side of head: No submandibular, tonsillar or occipital adenopathy.  Left side of head: No submandibular, tonsillar or occipital adenopathy.     Cervical: No cervical adenopathy.   Skin:    General: Skin is warm.     Capillary Refill: Capillary refill takes less than 2 seconds.     Coloration: Skin is not pale.     Findings: No abrasion, erythema, lesion, petechiae or rash. Rash is not papular, urticarial or vesicular.     Comments: Skin looks much better. He does have some erythematous areas on his feet bilaterally. Overall he is doing much better. Check looks markedly better. He has some itching around one his older scars.    Neurological:     Mental Status: He is alert.   Psychiatric:        Behavior: Behavior is cooperative.      Diagnostic studies: none       Marty Shaggy, MD  [facility] 

## 2023-07-10 NOTE — Patient Instructions (Addendum)
 1. Rash/possible atopic dermatitis - It seems that are good without the Dupixent , so we are not going to make any changes at all today.  - Continue with the Zyrtec (cetirizine) 10mg  twice daily to stay ahead of the itching.  - I am glad that we are getting this medication covered.  - We will send in triamcinolone  ointment to use twice daily as needed. - We will send in triamcinolone  cream to use twice daily as needed.    2.Allergic contact dermatitis due to other agents Final TRUE patch test reading done on 02/09/23: Positive to: Cl+ Me-Isothiazolinone Quaternium-15 Formaldehyde Diazolidinyl Urea Tixocortol-21-Pivalate Borderline to: Cobalt Dichloride Thimerosal  Thiuram Mix Imidazolidinyl Urea 2-Bromo-2-Nitropropane-1,3-diol Avoid items above.    Return in about 6 months (around 01/10/2024). You can have the follow up appointment with Dr. Iva or a Nurse Practicioner (our Nurse Practitioners are excellent and always have Physician oversight!).    Please inform us  of any Emergency Department visits, hospitalizations, or changes in symptoms. Call us  before going to the ED for breathing or allergy symptoms since we might be able to fit you in for a sick visit. Feel free to contact us  anytime with any questions, problems, or concerns.  It was a pleasure to see you and your family again today!  Websites that have reliable patient information: 1. American Academy of Asthma, Allergy, and Immunology: www.aaaai.org 2. Food Allergy Research and Education (FARE): foodallergy.org 3. Mothers of Asthmatics: http://www.asthmacommunitynetwork.org 4. American College of Allergy, Asthma, and Immunology: www.acaai.org      "Like" us  on Facebook and Instagram for our latest updates!      A healthy democracy works best when [employer] participate! Make sure you are registered to vote! If you have moved or changed any of your contact information, you will need to get this updated before  voting! Scan the QR codes below to learn more!

## 2023-09-27 ENCOUNTER — Ambulatory Visit: Admitting: Family Medicine

## 2023-10-01 ENCOUNTER — Other Ambulatory Visit: Payer: Self-pay | Admitting: Family Medicine

## 2023-10-01 DIAGNOSIS — E7841 Elevated Lipoprotein(a): Secondary | ICD-10-CM

## 2023-11-20 ENCOUNTER — Ambulatory Visit: Admitting: Family Medicine

## 2023-11-20 ENCOUNTER — Encounter: Payer: Self-pay | Admitting: Family Medicine

## 2023-11-20 VITALS — BP 147/72 | HR 63 | Ht 68.9 in | Wt 177.4 lb

## 2023-11-20 DIAGNOSIS — E663 Overweight: Secondary | ICD-10-CM

## 2023-11-20 DIAGNOSIS — I10 Essential (primary) hypertension: Secondary | ICD-10-CM | POA: Diagnosis not present

## 2023-11-20 DIAGNOSIS — R471 Dysarthria and anarthria: Secondary | ICD-10-CM | POA: Diagnosis not present

## 2023-11-20 DIAGNOSIS — M150 Primary generalized (osteo)arthritis: Secondary | ICD-10-CM | POA: Diagnosis not present

## 2023-11-20 DIAGNOSIS — E782 Mixed hyperlipidemia: Secondary | ICD-10-CM

## 2023-11-20 MED ORDER — OXYCODONE HCL 5 MG PO TABS
5.0000 mg | ORAL_TABLET | Freq: Three times a day (TID) | ORAL | 0 refills | Status: AC | PRN
Start: 2023-11-20 — End: ?

## 2023-11-20 NOTE — Assessment & Plan Note (Signed)
 Patient reports chronic, worsening joint pain in the shoulders and knees. History of cortisone injections. Exam reveals crepitus in the shoulder. - Order labs to evaluate for inflammatory causes of joint pain. - Recommend topical analgesics, specifically Voltaren gel and Icy Hot. - Recommend Tylenol  for pain as needed. - Prescribe a short course of oxycodone  for severe, breakthrough pain, for use only after trying conservative measures. Counseled on short-term use (1-2 weeks). - Will consider a course of oral steroids after MRI results are available. - Follow up in 6 weeks.

## 2023-11-20 NOTE — Progress Notes (Unsigned)
 Established Patient Office Visit  Subjective   Patient ID: Tommy Hobbs, male    DOB: 11-Jul-1940  Age: 83 y.o. MRN: [id number]  No chief complaint on file.   HPI  Subjective - Slurred speech ongoing for 5-6 months. Reports it began after an allergy shot (Dupixent ), which has since been discontinued for 5 months. No trouble understanding words, only expressive difficulty. Speech varies between normal and slurred. Denies weakness, numbness, or trouble walking. - Worsening arthritis pain, primarily in the shoulders and knees (below the knee). History of cortisone injections in the knees, but not recently.  Medications: Lipitor, aspirin, losartan , pantoprazole . Uses triamcinolone  cream for itching. Not currently taking Dupixent .  PMH: Arthritis, hypertension, acid reflux. History of ER visit in May for slurred speech, concern for stroke vs. reaction to Dupixent . MRI was recommended but not completed. PSH: 4-inch plate in neck. FH: Not discussed. Social Hx: Not discussed.  ROS: Neuro: Positive for expressive dysarthria. Negative for receptive aphasia, weakness, numbness, and gait disturbance. MSK: Positive for polyarthralgia, worse in shoulders and knees.   The ASCVD Risk score (Arnett DK, et al., 2019) failed to calculate for the following reasons:   The 2019 ASCVD risk score is only valid for ages 64 to 23  Health Maintenance Due  Topic Date Due   Zoster Vaccines- Shingrix (1 of 2) Never done   Pneumococcal Vaccine: 50+ Years (2 of 2 - PCV) 12/22/2008   Medicare Annual Wellness (AWV)  06/01/2023   Influenza Vaccine  08/10/2023   COVID-19 Vaccine (3 - 2025-26 season) 09/10/2023      Objective:     BP (!) 147/72   Pulse 63   Ht 5' 8.9 (1.75 m)   Wt 177 lb 6.4 oz (80.5 kg)   SpO2 100%   BMI 26.27 kg/m  {Vitals History (Optional):23777}  Physical Exam General: Alert, in no acute distress. Neuro: Cranial nerves II-XII grossly intact. Facial movements symmetric with  smiling, puffing cheeks, frowning, eye closure, and raising eyebrows. 5/5 strength in bilateral upper and lower extremities, including finger squeeze, push/pull, and shoulder abduction. No pronator drift. MSK: Crepitus noted on shoulder shrug.   No results found for any visits on 11/20/23.      Assessment & Plan:   Dysarthria Assessment & Plan: Patient presents with persistent slurred speech for 5 months, which started after a Dupixent  injection. An ED visit in May raised concern for a stroke, but a brain MRI was not performed. Neurological exam today is nonfocal except for dysarthria. The persistence of symptoms despite discontinuing the medication makes a CVA a concern that must be ruled out. - Order MRI of the brain to evaluate for a past cerebrovascular event. - Refer to Speech Therapy for evaluation and exercises. - Follow up in 6 weeks to review results.  Orders: -     MR BRAIN WO CONTRAST; Future -     Ambulatory referral to Speech Therapy  Primary osteoarthritis involving multiple joints Assessment & Plan: Patient reports chronic, worsening joint pain in the shoulders and knees. History of cortisone injections. Exam reveals crepitus in the shoulder. - Order labs to evaluate for inflammatory causes of joint pain. - Recommend topical analgesics, specifically Voltaren gel and Icy Hot. - Recommend Tylenol  for pain as needed. - Prescribe a short course of oxycodone  for severe, breakthrough pain, for use only after trying conservative measures. Counseled on short-term use (1-2 weeks). - Will consider a course of oral steroids after MRI results are available. - Follow up  in 6 weeks.   Other orders -     oxyCODONE  HCl; Take 1 tablet (5 mg total) by mouth every 8 (eight) hours as needed for severe pain (pain score 7-10) or breakthrough pain.  Dispense: 20 tablet; Refill: 0     Return in about 6 weeks (around 01/01/2024) for dysathria, pain.    Toribio MARLA Slain, MD

## 2023-11-20 NOTE — Patient Instructions (Signed)
 It was nice to see you today,  We addressed the following topics today: - We are ordering an MRI of your brain to see if you had a stroke. Someone will call you to schedule this. - I am also referring you to a speech therapist who can give you exercises to help improve your speech. - I would like you to get some blood tests done this week to look into the cause of your joint pain. - For your joint pain, please try using over-the-counter topical creams first, like Voltaren gel or Icy Hot. You can also use Tylenol  1000mg  every 8 hours. - If the pain is still severe after trying the creams and Tylenol , you can use the oxycodone  prescription. I do not want you to take this for a long time, just for a week or two for severe pain. - Please schedule a follow-up appointment to see me in about six weeks. We need to have the MRI done before that visit.  Have a great day,  Rolan Slain, MD

## 2023-11-20 NOTE — Assessment & Plan Note (Signed)
 Patient presents with persistent slurred speech for 5 months, which started after a Dupixent  injection. An ED visit in May raised concern for a stroke, but a brain MRI was not performed. Neurological exam today is nonfocal except for dysarthria. The persistence of symptoms despite discontinuing the medication makes a CVA a concern that must be ruled out. - Order MRI of the brain to evaluate for a past cerebrovascular event. - Refer to Speech Therapy for evaluation and exercises. - Follow up in 6 weeks to review results.

## 2023-11-29 ENCOUNTER — Other Ambulatory Visit

## 2023-12-03 ENCOUNTER — Other Ambulatory Visit

## 2023-12-03 DIAGNOSIS — I10 Essential (primary) hypertension: Secondary | ICD-10-CM

## 2023-12-03 DIAGNOSIS — R471 Dysarthria and anarthria: Secondary | ICD-10-CM

## 2023-12-03 DIAGNOSIS — E663 Overweight: Secondary | ICD-10-CM

## 2023-12-03 DIAGNOSIS — E782 Mixed hyperlipidemia: Secondary | ICD-10-CM

## 2023-12-04 LAB — LIPID PANEL
Chol/HDL Ratio: 3.8 ratio (ref 0.0–5.0)
Cholesterol, Total: 147 mg/dL (ref 100–199)
HDL: 39 mg/dL — ABNORMAL LOW (ref >39)
LDL Chol Calc (NIH): 88 mg/dL (ref 0–99)
Triglycerides: 112 mg/dL (ref 0–149)
VLDL Cholesterol Cal: 20 mg/dL (ref 5–40)

## 2023-12-04 LAB — COMPREHENSIVE METABOLIC PANEL WITH GFR
ALT: 45 IU/L — ABNORMAL HIGH (ref 0–44)
AST: 34 IU/L (ref 0–40)
Albumin: 4.2 g/dL (ref 3.7–4.7)
Alkaline Phosphatase: 88 IU/L (ref 48–129)
BUN/Creatinine Ratio: 22 (ref 10–24)
BUN: 25 mg/dL (ref 8–27)
Bilirubin Total: 0.7 mg/dL (ref 0.0–1.2)
CO2: 22 mmol/L (ref 20–29)
Calcium: 9.2 mg/dL (ref 8.6–10.2)
Chloride: 103 mmol/L (ref 96–106)
Creatinine, Ser: 1.15 mg/dL (ref 0.76–1.27)
Globulin, Total: 2.1 g/dL (ref 1.5–4.5)
Glucose: 95 mg/dL (ref 70–99)
Potassium: 3.7 mmol/L (ref 3.5–5.2)
Sodium: 139 mmol/L (ref 134–144)
Total Protein: 6.3 g/dL (ref 6.0–8.5)
eGFR: 64 mL/min/1.73 (ref >59)

## 2023-12-04 LAB — CBC WITH DIFFERENTIAL/PLATELET
Basophils Absolute: 0 x10E3/uL (ref 0.0–0.2)
Basos: 0 %
EOS (ABSOLUTE): 0.4 x10E3/uL (ref 0.0–0.4)
Eos: 8 %
Hematocrit: 44.7 % (ref 37.5–51.0)
Hemoglobin: 15.2 g/dL (ref 13.0–17.7)
Immature Grans (Abs): 0 x10E3/uL (ref 0.0–0.1)
Immature Granulocytes: 0 %
Lymphocytes Absolute: 2 x10E3/uL (ref 0.7–3.1)
Lymphs: 36 %
MCH: 33.5 pg — ABNORMAL HIGH (ref 26.6–33.0)
MCHC: 34 g/dL (ref 31.5–35.7)
MCV: 99 fL — ABNORMAL HIGH (ref 79–97)
Monocytes Absolute: 0.5 x10E3/uL (ref 0.1–0.9)
Monocytes: 9 %
Neutrophils Absolute: 2.6 x10E3/uL (ref 1.4–7.0)
Neutrophils: 47 %
Platelets: 171 x10E3/uL (ref 150–450)
RBC: 4.54 x10E6/uL (ref 4.14–5.80)
RDW: 11.8 % (ref 11.6–15.4)
WBC: 5.6 x10E3/uL (ref 3.4–10.8)

## 2023-12-04 LAB — TSH: TSH: 2.65 u[IU]/mL (ref 0.450–4.500)

## 2023-12-04 LAB — HEMOGLOBIN A1C
Est. average glucose Bld gHb Est-mCnc: 108 mg/dL
Hgb A1c MFr Bld: 5.4 % (ref 4.8–5.6)

## 2023-12-05 ENCOUNTER — Ambulatory Visit: Payer: Self-pay | Admitting: Family Medicine

## 2023-12-09 ENCOUNTER — Ambulatory Visit
Admission: RE | Admit: 2023-12-09 | Discharge: 2023-12-09 | Disposition: A | Source: Ambulatory Visit | Attending: Family Medicine

## 2023-12-09 DIAGNOSIS — R471 Dysarthria and anarthria: Secondary | ICD-10-CM

## 2023-12-24 NOTE — Therapy (Incomplete)
 OUTPATIENT SPEECH LANGUAGE PATHOLOGY EVALUATION   Patient Name: Tommy Hobbs MRN: [id number] DOB:1940-09-17, 83 y.o., male Today's Date: 12/24/2023  PCP: Tommy Sieving, MD REFERRING PROVIDER: Chandra Sieving, MD  END OF SESSION:   Past Medical History:  Diagnosis Date   Arthritis    Constipation    DDD (degenerative disc disease)    Dysrhythmia    history svt-clean cath 2004- Had SVT One Time.   GERD (gastroesophageal reflux disease)    Headache    History of BPH    History of kidney stones    x 1 passed   PTSD (post-traumatic stress disorder)    Wears dentures    full top   Past Surgical History:  Procedure Laterality Date   APPENDECTOMY  1977   CARDIAC CATHETERIZATION  2004   CATARACT EXTRACTION, BILATERAL Bilateral    CERVICAL FUSION  2011   CHOLECYSTECTOMY  1977   open   COLONOSCOPY     INGUINAL HERNIA REPAIR Right 1977   right   INGUINAL HERNIA REPAIR Left 08/04/2013   Procedure: OPEN LEFT INGUINAL HERNIA REPAIR;  Surgeon: Tommy CHRISTELLA Pacini, MD;  Location: [facility];  Service: General;  Laterality: Left;   INSERTION OF MESH N/A 08/04/2013   Procedure: INSERTION OF MESH;  Surgeon: Tommy CHRISTELLA Pacini, MD;  Location: [facility];  Service: General;  Laterality: N/A;   KNEE ARTHROSCOPY Left    KYPHOPLASTY N/A 08/17/2014   Procedure: CLEVE ONE;  Surgeon: Tommy Peaches, MD;  Location: [facility];  Service: Neurosurgery;  Laterality: N/A;   SUPRAPUBIC PROSTATECTOMY  2012   partial for severe BPH   Patient Active Problem List   Diagnosis Date Noted   Dysarthria 11/20/2023   Eosinophilia 03/27/2023   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   Allergic dermatitis 12/27/2022   Benign neoplasm of colon 06/16/2021   Elevated PSA 06/16/2021   Hypertrophy of prostate without urinary obstruction and other lower urinary tract symptoms (LUTS) 06/16/2021   Osteoarthritis 06/16/2021   Prostatitis, chronic 06/16/2021   Ventral hernia  06/16/2021   Vitamin D  deficiency 06/16/2021   Overweight (BMI 25.0-29.9) 08/30/2017   Essential hypertension 08/30/2017   Mixed hyperlipidemia 05/02/2017   GERD (gastroesophageal reflux disease) 01/25/2017   h/o Dysrhythmia 01/25/2017   History of BPH 01/25/2017   DDD (degenerative disc disease) 01/25/2017   h/o PTSD (post-traumatic stress disorder)- served in [place] 01/25/2017   Left inguinal hernia 07/17/2013   Other ill-defined and unknown causes of morbidity and mortality 01/09/1969    ONSET DATE: May 2025 -  script 11/20/23  REFERRING DIAG: Dysarthria  THERAPY DIAG:  No diagnosis found.  Rationale for Evaluation and Treatment: Rehabilitation  SUBJECTIVE:   SUBJECTIVE STATEMENT: *** Pt accompanied by: {accompnied:27141}  PERTINENT HISTORY: ***  PAIN:  Are you having pain? {OPRCPAIN:27236}  FALLS: Has patient fallen in last 6 months?  {DOEQJOOD:74320}  LIVING ENVIRONMENT: Lives with: {OPRC lives with:25569::lives with their family} Lives in: {Lives in:25570}  PLOF:  Level of assistance: {DOEEONQ:74326} Employment: {SLPemployment:25674}  PATIENT GOALS: ***  OBJECTIVE:  Note: Objective measures were completed at Evaluation unless otherwise noted.  DIAGNOSTIC FINDINGS:  Tommy Hobbs AT [facility] Provider Note CSN: [id number] Arrival date & time: 05/24/23  1857 History    Chief Complaint  Patient presents with   Aphasia  Tommy Hobbs is a 83 y.o. male.   83 year old male with past medical history of hypertension hyperlipidemia presenting to the emergency Hobbs today with slurred speech.  This been going on now for the past week or so.  The patient states that he thinks that this is due to starting Dupixent  recently.  He states that he has been on this for about 5 weeks.  He denies any focal weakness, numbness, or tingling.  He states that he is otherwise feeling well.  He does report some urinary frequency and  urgency but states that this is not totally out of the ordinary for him.  He came to the ER today after calling his doctor and being referred here for further evaluation.    MRI WO CONT 12/14/23 IMPRESSION: 1. No acute intracranial abnormality. 2. Age-related atrophy and moderate diffuse cerebral white matter disease.   COGNITION: Overall cognitive status: {cognition:24006} Areas of impairment:  {cognitiveimpairmentslp:27409} Functional deficits: ***  AUDITORY COMPREHENSION: Overall auditory comprehension: {IMPAIRED:25374} YES/NO questions: {IMPAIRED:25374} Following directions: {IMPAIRED:25374} Conversation: {SLP conversation:25430} Interfering components: {SLP interfering components:25431} Effective technique: {SLP effective technique:25432}  READING COMPREHENSION: {SLPreadingcomprehension:27140}  EXPRESSION: {SLP EXPRESSION:25433}  VERBAL EXPRESSION: Level of generative/spontaneous verbalization: {SLP level of generative/spontaneious verbalization:25435} Automatic speech: {SLP ATOMIC SPEECH:25434}  Repetition: {SLPrepetion:27212} Naming: {SLPnaming:27214} Pragmatics: {slppragmatics:27216} Comments: *** Interfering components: {SLP INTERFERING COMPONENTS:25436} Effective technique: {SLP EFFECTIVE TECHNIQUE:25437} Non-verbal means of communication: {SLP non verbal means of communication:25438}  WRITTEN EXPRESSION: Dominant hand: {RIGHT/LEFT:20294} Written expression: {slpwrittenexp:27209}  MOTOR SPEECH: Overall motor speech: {slpimpaired:27210} Level of impairment: {SLP level of impairment:25441} Respiration: {respbreathing:27195} Phonation: {SLP phonation:25439} Resonance: {SLP resonance:25440} Articulation: {SLParticulation:27218} Intelligibility: {SLP Intelligible:25442} Motor planning: {slpmotorspeecherrors:27220} Motor speech errors: {SLP motor speech errors:25443} Interfering components: {SLP Interfering components (MS):25444} Effective technique: {SLP  effective technique (MS):25445}  ORAL MOTOR EXAMINATION: Overall status: {OMESLP2:27645} Comments: ***  RECOMMENDATIONS FROM OBJECTIVE SWALLOW STUDY (MBSS/FEES):  *** Objective swallow impairments: *** Objective recommended compensations: ***  CLINICAL SWALLOW ASSESSMENT:   Current diet: {slpdiet:27196} Dentition: {dentition:27197} Patient directly observed with POs: {POobserved:27199} Feeding: {slp feeding:27200} Liquids provided by: {SLPliquids:27201} Oral phase signs and symptoms: {SLPoralphase:27202} Pharyngeal phase signs and symptoms: {SLPpharyngealphase:27203} Comments: ***  STANDARDIZED ASSESSMENTS: {SLPstandardizedassessment:27092}  PATIENT REPORTED OUTCOME MEASURES (PROM): {SLPPROM:27095}                                                                                                                            TREATMENT DATE: ***   PATIENT EDUCATION: Education details: *** Person educated: {Person educated:25204} Education method: {Education Method:25205} Education comprehension: {Education Comprehension:25206}   GOALS: Goals reviewed with patient? {yes/no:20286}  SHORT TERM GOALS: Target date: ***  *** Baseline: Goal status: INITIAL  2.  *** Baseline:  Goal status: INITIAL  3.  *** Baseline:  Goal status: INITIAL  4.  *** Baseline:  Goal status: INITIAL  5.  *** Baseline:  Goal status: INITIAL  6.  *** Baseline:  Goal status: INITIAL  LONG TERM GOALS: Target date: ***  *** Baseline:  Goal status: INITIAL  2.  *** Baseline:  Goal status: INITIAL  3.  *** Baseline:  Goal status: INITIAL  4.  *** Baseline:  Goal  status: INITIAL  5.  *** Baseline:  Goal status: INITIAL  6.  *** Baseline:  Goal status: INITIAL  ASSESSMENT:  CLINICAL IMPRESSION: Patient is a 83 y.o. M who was seen today for ***.   OBJECTIVE IMPAIRMENTS: include {SLPOBJIMP:27107}. These impairments are limiting patient from {SLPLIMIT:27108}. Factors  affecting potential to achieve goals and functional outcome are {SLP factors:25450}. Patient will benefit from skilled SLP services to address above impairments and improve overall function.  REHAB POTENTIAL: {rehabpotential:25112}  PLAN:  SLP FREQUENCY: {rehab frequency:25116}  SLP DURATION: {rehab duration:25117}  PLANNED INTERVENTIONS: {SLP treatment/interventions:25449}    Yamaris Cummings, CCC-SLP 12/24/2023, 4:14 PM

## 2023-12-25 ENCOUNTER — Ambulatory Visit: Attending: Family Medicine

## 2024-01-01 ENCOUNTER — Ambulatory Visit (INDEPENDENT_AMBULATORY_CARE_PROVIDER_SITE_OTHER): Admitting: Family Medicine

## 2024-01-01 ENCOUNTER — Encounter: Payer: Self-pay | Admitting: Family Medicine

## 2024-01-01 VITALS — BP 142/72 | HR 73 | Ht 68.9 in | Wt 178.2 lb

## 2024-01-01 DIAGNOSIS — I10 Essential (primary) hypertension: Secondary | ICD-10-CM

## 2024-01-01 DIAGNOSIS — R4701 Aphasia: Secondary | ICD-10-CM

## 2024-01-01 DIAGNOSIS — R471 Dysarthria and anarthria: Secondary | ICD-10-CM

## 2024-01-01 NOTE — Assessment & Plan Note (Signed)
-  Worsening speech over 6 months with no other significant neurologic deficits. Would classify it as more dysarthric than aphasic. Speech is slowed but could be due to word finding difficulties.  Cognition and memory appear intact per self-report and family. MRI of the brain was unremarkable for acute process, showing only age-related diffuse cerebral white matter changes. Physical exam today is largely non-focal. Given the negative MRI, the etiology is unclear. Primary progressive aphasia vs. Atypical initial symptoms of dementia? - Will check labs to rule out other contributing factors,  - Refer to Neurology for further evaluation. - Continue with scheduled Speech Therapy appointment on 01/07/2024. - Follow up here in 2 months.

## 2024-01-01 NOTE — Patient Instructions (Signed)
 It was nice to see you today,  We addressed the following topics today: - I have ordered some additional blood tests for you. - I am also putting in a referral to a neurologist. Someone from their office will call you to schedule an appointment. If you do not hear from them within three weeks, please message our office, and we can provide you with their contact information. - Please schedule a follow-up appointment with me in about two months. - Continue with your plan to see the speech therapist.  Have a great day,  Rolan Slain, MD
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
## 2024-01-01 NOTE — Assessment & Plan Note (Signed)
-   Elevated BP today and at the last visit. Does not check BP at home. Recheck was improved but still slightly elevated.  - Continue current losartan . Will recheck at next visit and discuss medication changes if necessary

## 2024-01-05 LAB — B12 AND FOLATE PANEL
Folate: 12.9 ng/mL
Vitamin B-12: 677 pg/mL (ref 232–1245)

## 2024-01-05 LAB — METHYLMALONIC ACID, SERUM: Methylmalonic Acid: 245 nmol/L (ref 0–378)

## 2024-01-05 LAB — SEDIMENTATION RATE: Sed Rate: 3 mm/h (ref 0–30)

## 2024-01-05 LAB — COMPREHENSIVE METABOLIC PANEL WITH GFR
ALT: 36 IU/L (ref 0–44)
AST: 26 IU/L (ref 0–40)
Albumin: 4.2 g/dL (ref 3.7–4.7)
Alkaline Phosphatase: 93 IU/L (ref 48–129)
BUN/Creatinine Ratio: 21 (ref 10–24)
BUN: 23 mg/dL (ref 8–27)
Bilirubin Total: 0.6 mg/dL (ref 0.0–1.2)
CO2: 20 mmol/L (ref 20–29)
Calcium: 8.8 mg/dL (ref 8.6–10.2)
Chloride: 105 mmol/L (ref 96–106)
Creatinine, Ser: 1.08 mg/dL (ref 0.76–1.27)
Globulin, Total: 2 g/dL (ref 1.5–4.5)
Glucose: 118 mg/dL — ABNORMAL HIGH (ref 70–99)
Potassium: 3.8 mmol/L (ref 3.5–5.2)
Sodium: 141 mmol/L (ref 134–144)
Total Protein: 6.2 g/dL (ref 6.0–8.5)
eGFR: 69 mL/min/1.73

## 2024-01-05 LAB — C-REACTIVE PROTEIN: CRP: 2 mg/L (ref 0–10)

## 2024-01-06 ENCOUNTER — Other Ambulatory Visit: Payer: Self-pay | Admitting: Family Medicine

## 2024-01-06 ENCOUNTER — Ambulatory Visit: Payer: Self-pay | Admitting: Family Medicine

## 2024-01-06 DIAGNOSIS — K219 Gastro-esophageal reflux disease without esophagitis: Secondary | ICD-10-CM

## 2024-01-07 ENCOUNTER — Ambulatory Visit

## 2024-01-14 ENCOUNTER — Ambulatory Visit: Attending: Family Medicine

## 2024-01-14 DIAGNOSIS — R1312 Dysphagia, oropharyngeal phase: Secondary | ICD-10-CM | POA: Diagnosis present

## 2024-01-14 DIAGNOSIS — R1310 Dysphagia, unspecified: Secondary | ICD-10-CM | POA: Diagnosis present

## 2024-01-14 DIAGNOSIS — R471 Dysarthria and anarthria: Secondary | ICD-10-CM | POA: Insufficient documentation

## 2024-01-14 DIAGNOSIS — R41841 Cognitive communication deficit: Secondary | ICD-10-CM | POA: Insufficient documentation

## 2024-01-14 NOTE — Therapy (Signed)
 " OUTPATIENT SPEECH LANGUAGE PATHOLOGY EVALUATION   Patient Name: LIBERO PUTHOFF MRN: [id number] DOB:[birth year], 84 y.o., male Today's Date: 01/14/2024  PCP: Chandra Sieving, MD REFERRING PROVIDER: Chandra Sieving, MD  END OF SESSION:  End of Session - 01/14/24 1336     Visit Number 1    Number of Visits 13    Date for Recertification  03/07/24    SLP Start Time 1104    SLP Stop Time  1145    SLP Time Calculation (min) 41 min    Activity Tolerance Patient tolerated treatment well;Other (comment)   tearful x3         Past Medical History:  Diagnosis Date   Arthritis    Constipation    DDD (degenerative disc disease)    Dysrhythmia    history svt-clean cath 2004- Had SVT One Time.   GERD (gastroesophageal reflux disease)    Headache    History of BPH    History of kidney stones    x 1 passed   PTSD (post-traumatic stress disorder)    Wears dentures    full top   Past Surgical History:  Procedure Laterality Date   APPENDECTOMY  1977   CARDIAC CATHETERIZATION  2004   CATARACT EXTRACTION, BILATERAL Bilateral    CERVICAL FUSION  2011   CHOLECYSTECTOMY  1977   open   COLONOSCOPY     INGUINAL HERNIA REPAIR Right 1977   right   INGUINAL HERNIA REPAIR Left 08/04/2013   Procedure: OPEN LEFT INGUINAL HERNIA REPAIR;  Surgeon: Elon CHRISTELLA Pacini, MD;  Location: [facility];  Service: General;  Laterality: Left;   INSERTION OF MESH N/A 08/04/2013   Procedure: INSERTION OF MESH;  Surgeon: Elon CHRISTELLA Pacini, MD;  Location: [facility];  Service: General;  Laterality: N/A;   KNEE ARTHROSCOPY Left    KYPHOPLASTY N/A 08/17/2014   Procedure: CLEVE ONE;  Surgeon: Lamar Peaches, MD;  Location: [facility];  Service: Neurosurgery;  Laterality: N/A;   SUPRAPUBIC PROSTATECTOMY  2012   partial for severe BPH   Patient Active Problem List   Diagnosis Date Noted   Dysarthria 11/20/2023   Eosinophilia 03/27/2023   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    Allergic dermatitis 12/27/2022   Benign neoplasm of colon 06/16/2021   Elevated PSA 06/16/2021   Hypertrophy of prostate without urinary obstruction and other lower urinary tract symptoms (LUTS) 06/16/2021   Osteoarthritis 06/16/2021   Prostatitis, chronic 06/16/2021   Ventral hernia 06/16/2021   Vitamin D  deficiency 06/16/2021   Overweight (BMI 25.0-29.9) 08/30/2017   Essential hypertension 08/30/2017   Mixed hyperlipidemia 05/02/2017   GERD (gastroesophageal reflux disease) 01/25/2017   h/o Dysrhythmia 01/25/2017   History of BPH 01/25/2017   DDD (degenerative disc disease) 01/25/2017   h/o PTSD (post-traumatic stress disorder)- served in [place] 01/25/2017   Left inguinal hernia 07/17/2013   Other ill-defined and unknown causes of morbidity and mortality 01/09/1969    ONSET DATE: May 2025 -  script 11/20/23  REFERRING DIAG: Dysarthria  THERAPY DIAG:  Dysarthria and anarthria  Dysphagia, oropharyngeal phase  Cognitive communication deficit  Rationale for Evaluation and Treatment: Rehabilitation  SUBJECTIVE:   SUBJECTIVE STATEMENT: I think some of this has been going on since April.  Pt accompanied by: significant other Janice  PERTINENT HISTORY: HPI   From Dr.Olson- 01/01/24 Subjective - Follow up for progressively worsening slurred/slowed speech over the last 6 months. Reports the speech is worse today and yesterday, and that it fluctuates.  Stepson who accompanied him to the visit also notes the speech has gotten progressively worse. [name] believes his cognition is intact, but he has difficulty expressing his thoughts. He also reports some new issues with balance, describing him as a little bit shakier. - Denies new issues or concerns.  PAIN:  Are you having pain? No  FALLS: Has patient fallen in last 6 months?  Comment: did not ask due to time constraints  LIVING ENVIRONMENT: Lives with: lives with their spouse Lives in: House/apartment  PLOF:   Level of assistance: Independent with ADLs, Independent with IADLs Employment: Retired  PATIENT GOALS: Improve speaking ability  OBJECTIVE:  Note: Objective measures were completed at Evaluation unless otherwise noted.  DIAGNOSTIC FINDINGS:  MR BRAIN WO CONT - 12/14/23 IMPRESSION: 1. No acute intracranial abnormality. 2. Age-related atrophy and moderate diffuse cerebral white matter disease.   [facility] Provider Note CSN: [id number] Arrival date & time: 05/24/23  1857 History    Chief Complaint  Patient presents with   Aphasia  WENDY MIKLES is a 84 y.o. male.   84 year old male with past medical history of hypertension hyperlipidemia presenting to the emergency department today with slurred speech.  This been going on now for the past week or so.  The patient states that he thinks that this is due to starting Dupixent  recently.  He states that he has been on this for about 5 weeks.  He denies any focal weakness, numbness, or tingling.  He states that he is otherwise feeling well.  He does report some urinary frequency and urgency but states that this is not totally out of the ordinary for him.  He came to the ER today after calling his doctor and being referred here for further evaluation.     Dr. CHANDRA - 01/01/24: Dysarthria Assessment & Plan: -Worsening speech over 6 months with no other significant neurologic deficits. Would classify it as more dysarthric than aphasic. Speech is slowed but could be due to word finding difficulties.  Cognition and memory appear intact per self-report and family. MRI of the brain was unremarkable for acute process, showing only age-related diffuse cerebral white matter changes. Physical exam today is largely non-focal. Given the negative MRI, the etiology is unclear. Primary progressive aphasia vs. Atypical initial symptoms of dementia? - Will check labs to rule out other contributing factors,  -  Refer to Neurology for further evaluation. - Continue with scheduled Speech Therapy appointment on 01/07/2024. - Follow up here in 2 months.  COGNITION: Overall cognitive status: not assessed at this time due to time constraints. Pt states his problem solving abilities have declined in the last 3-6 months.  Areas of impairment:  Executive function: Impaired: Problem solving Functional deficits: Can't problem solve well to solve computer issues, pt states  AUDITORY COMPREHENSION: Overall auditory comprehension: Appears intact YES/NO questions: Appears intact Following directions: Appears intact Conversation: Complex and Moderately Complex Interfering components: none  VERBAL EXPRESSION: Level of generative/spontaneous verbalization: conversation Automatic speech: day of week: intact and month of year: intact  Repetition: inconsistent articulatory errors that do not appear to be aphasic in nature Naming: Confrontation: 100% Pragmatics: Appears intact Comments: Pt withOUT any appreciable aphasia in complex conversation  WRITTEN EXPRESSION: Written expression: Not tested  MOTOR SPEECH: Overall motor speech: impaired Level of impairment: Word, Phrase, Sentence, and Conversation Respiration: thoracic breathing and diaphragmatic/abdominal breathing Phonation: normal Resonance: hypernasality (mild, intermittent) Articulation: Impaired: word, phrase, sentence, and conversation Intelligibility: Intelligibility reduced:  93% today; functionally, SLP had to ask pt to repeat x5 today in this evaluation. When he did so he overarticulated which improved speech intelligibility. Motor planning: Appears intact Motor speech errors: aware and inconsistent Interfering components: none Effective technique: slow rate and over articulate COMMENTS: DDK rate was slower than WNL. During this task he exhibited imprecise articulation including abnormal prosodic stress, and abnormal/incoordinated voicing of  consonants (e.g., /d/ for /t), including /a/ in final voiceless consonant positions - e.g., /papa/ for pop', fisha for fish. These articlatory errors also occurred in conversation, more often in multisyllablic words.  ORAL MOTOR EXAMINATION: Overall status: Impaired: Labial: Bilateral (Strength and Coordination) Lingual: Left (Symmetry) Bilateral (Strength and Coordination) Facial: Right (Symmetry) Velum: ROM and Coordination Comments: mild lingual strength deficits; ; pt with left facial symmetry differences; pt's /a/ to assess velum was with suspected open velopharyngeal port.  RECOMMENDATIONS FROM OBJECTIVE SWALLOW STUDY:  none noted in chart MBS is recommended due to pt's and wife's endorsement of coughing with meals multiple times per day.   PATIENT REPORTED OUTCOME MEASURES (PROM): EAT-10:   and Communication Effectiveness Survey: provided in first session                                                                                                                            TREATMENT DATE:   01/15/24: Today SLP worked with pt to improve articulation by teaching overarticulation as a compensatory strategy. When SLP told pt to overarticulate and provided model, when asked pt about his occupation and other mod-complex topics for 2 minutes, articulation improved to 100%. Romero agreed pt's articulation sounded markedly improved with overarticulation. Secondly, pt was taught rationale for smaller bites and sips, with only solids or only liquids in oral cavity at one time, with meals. SLP recommends MBS to assess oropharyngeal swallow.    PATIENT EDUCATION: Education details: see treatment date above Person educated: Patient and Spouse Education method: Explanation, Demonstration, Verbal cues, and Handouts Education comprehension: verbalized understanding, returned demonstration, verbal cues required, and needs further education   GOALS: Goals reviewed with patient? Yes, in  general  SHORT TERM GOALS: Target date: 02/15/24  Pt will undergo MBS due to coughing with meals daily Baseline: Goal status: INITIAL  2.  Pt will use trained compensation/s for speech intelligibility 90% of the time in multi-sentence responses in two sessions Baseline:  Goal status: INITIAL  3.  Pt will use trained compensations for swallowing deficits with POs with mod I in 2 sessions Baseline:  Goal status: INITIAL  4.  Pt will demo compensations for problem solving (pre-planning, organizational compensations, etc) in or between 2 sessions Baseline:  Goal status: INITIAL   LONG TERM GOALS: Target date: 03/14/24  Pt will improve one or both PROMs Baseline:  Goal status: INITIAL  2.  Pt will use trained compensation/s for speech intelligibility 90% of the time in 10 minutes conversation in two sessions Baseline:  Goal status: INITIAL  3.  Pt  will use trained compensations for swallowing deficits with POs in 2 sessions Baseline:  Goal status: INITIAL   ASSESSMENT:  CLINICAL IMPRESSION: Patient is a 84 y.o. M who was seen today for assessment of speech intelligibility after gradually declining speech intelligibility since approx spring 2025, in light of MRI results above. Pt reports severity of slurring varies from day to day. He also has daily coughing with meals, and cognitive (decr'd ability to problem solve) and balance changes. Pt also endorses more crying spells in the last 6 months. Pt has PTSD but these spells have occurred much more often in the last 6-9 months than prior to this time. His current status with his speech is frustrating to him, and his coughing with meals is concerning. Lastly he reports he gets frustrated when he can no longer problem solve with his computer as he used to be able to do. Pt's first 2-3 visits will be every other week due to pt will now use speech and swallowing compensations but requires a MBS for further treatment for swallowing which  will take 3-4 weeks to schedule and receive. SLP encouraged pt to call neurological practice and get appointment scheduled rather than wait for the office to call them.    OBJECTIVE IMPAIRMENTS: include executive functioning, dysarthria, and dysphagia. These impairments are limiting patient from household responsibilities, ADLs/IADLs, effectively communicating at home and in community, and safety when swallowing. Factors affecting potential to achieve goals and functional outcome are uncertain dx at this time. Patient will benefit from skilled SLP services to address above impairments and improve overall function.  REHAB POTENTIAL: Good  PLAN:  SLP FREQUENCY: 1x/week  SLP DURATION: 10 weeks; first two-three sessions will be every other week due to uncertain diagnosis and need for MBS  PLANNED INTERVENTIONS: Aspiration precaution training, Pharyngeal strengthening exercises, Diet toleration management , Environmental controls, Trials of upgraded texture/liquids, Cognitive reorganization, Internal/external aids, Oral motor exercises, Multimodal communication approach, SLP instruction and feedback, Compensatory strategies, Patient/family education, [phone] Treatment of speech (30 or 45 min) , and [id number] Treatment of swallowing function    Pearly Bartosik, CCC-SLP 01/14/2024, 2:36 PM      "

## 2024-01-14 NOTE — Patient Instructions (Signed)
" ° °  When you talk, open your mouth more, to exaggerate the movements for speaking. This will improve your speech clarity  When you eat, take smaller bites and sips, and have either food OR drink in your mouth at once.  If you need to take some liquid because the food is dry, take a SMALL bit of liquid.     [facility] [phone] [address] [place],  [place]  [address]  Get Driving Directions Main: [phone]  Dr. Buck Dr. Gregg Dr. Dohmeier "

## 2024-01-15 ENCOUNTER — Telehealth (HOSPITAL_COMMUNITY): Payer: Self-pay | Admitting: *Deleted

## 2024-01-15 ENCOUNTER — Other Ambulatory Visit: Payer: Self-pay

## 2024-01-15 ENCOUNTER — Ambulatory Visit: Admitting: Allergy & Immunology

## 2024-01-15 ENCOUNTER — Encounter: Payer: Self-pay | Admitting: Allergy & Immunology

## 2024-01-15 VITALS — BP 141/82 | HR 66 | Temp 98.2°F | Resp 18 | Ht 71.0 in | Wt 180.0 lb

## 2024-01-15 DIAGNOSIS — L2389 Allergic contact dermatitis due to other agents: Secondary | ICD-10-CM | POA: Diagnosis not present

## 2024-01-15 DIAGNOSIS — R1310 Dysphagia, unspecified: Secondary | ICD-10-CM

## 2024-01-15 DIAGNOSIS — L299 Pruritus, unspecified: Secondary | ICD-10-CM

## 2024-01-15 NOTE — Progress Notes (Signed)
 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 Rash/possible atopic dermatitis - Continue with the Zyrtec (cetirizine) 10mg  twice daily to stay ahead of the itching.  - I am glad that we are getting this medication covered.  - We will send in triamcinolone  ointment to use twice daily as needed. - We will send in triamcinolone  cream to use twice daily as needed.    2.Allergic contact dermatitis due to other agents Final TRUE patch test reading done on 02/09/23: Positive to: Cl+ Me-Isothiazolinone Quaternium-15 Formaldehyde Diazolidinyl Urea Tixocortol-21-Pivalate Borderline to: Cobalt Dichloride Thimerosal  Thiuram Mix Imidazolidinyl Urea 2-Bromo-2-Nitropropane-1,3-diol Avoid items above.   Return if symptoms worsen or fail to improve. You can have the follow up appointment with Dr. Iva or a Nurse Practicioner (our Nurse Practitioners are excellent and always have Physician oversight!).   Subjective:   Tommy Hobbs is a 84 y.o. male presenting today for follow up of  Chief Complaint  Patient presents with   Follow-up    No complaints     Tommy Hobbs has a history of the following: Patient Active Problem List   Diagnosis Date Noted   Dysarthria 11/20/2023   Eosinophilia 03/27/2023   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   Allergic dermatitis 12/27/2022   Benign neoplasm of colon 06/16/2021   Elevated PSA 06/16/2021   Hypertrophy of prostate without  urinary obstruction and other lower urinary tract symptoms (LUTS) 06/16/2021   Osteoarthritis 06/16/2021   Prostatitis, chronic 06/16/2021   Ventral hernia 06/16/2021   Vitamin D  deficiency 06/16/2021   Overweight (BMI 25.0-29.9) 08/30/2017   Essential hypertension 08/30/2017   Mixed hyperlipidemia 05/02/2017   GERD (gastroesophageal reflux disease) 01/25/2017   h/o Dysrhythmia 01/25/2017   History of BPH 01/25/2017   DDD (degenerative disc disease) 01/25/2017   h/o PTSD (post-traumatic stress disorder)- served in [place] 01/25/2017   Left inguinal hernia 07/17/2013   Other ill-defined and unknown causes of morbidity and mortality 01/09/1969    History obtained from: chart review and patient.  Discussed the use of AI scribe software for clinical note transcription with the patient and/or guardian, who gave verbal consent to proceed.  Tommy Hobbs is a 84 y.o. male presenting for a follow up visit.  Tommy Hobbs has been experiencing intermittent speech difficulties. An MRI showed white matter changes, and Tommy Hobbs is awaiting a neurology appointment in May.  Tommy Hobbs also experiences swallowing difficulties, occasionally choking while eating. Tommy Hobbs manages this by chewing slowly and taking small swallows. A swallow study is scheduled for February 5th to further evaluate this issue.   In May of the previous year, Tommy Hobbs visited the emergency room due to concerns of a stroke. The MRI was eventually completed in November after a delay due to a lack of a doctor's order. This was all related to the hoarseness that Tommy Hobbs was experiencing in May 2025. We recommended that Tommy Hobbs go to the ED. But Tommy Hobbs did not get the MRI until November for some reason. Results shown below:   IMPRESSION: 1. No acute intracranial abnormality. 2. Age-related atrophy and  moderate diffuse cerebral white matter disease.  Skin Symptom History: A previous rash has cleared up, and Tommy Hobbs has not needed to avoid chemicals or change his routine significantly. Tommy Hobbs  has enough triamcinolone  to last for a long period of time.  Otherwise, there have been no changes to his past medical history, surgical history, family history, or social history.    Review of systems otherwise negative other than that mentioned in the HPI.    Objective:   Blood pressure (!) 141/82, pulse 66, temperature 98.2 F (36.8 C), temperature source Temporal, resp. rate 18, height 5' 11 (1.803 m), weight 180 lb (81.6 kg), SpO2 99%. Body mass index is 25.1 kg/m.    Physical Exam Vitals reviewed.  Constitutional:      Appearance: Tommy Hobbs is well-developed.     Comments: Difficulty making full sentences.   HENT:     Head: Normocephalic and atraumatic.     Right Ear: Tympanic membrane, ear canal and external ear normal. No drainage, swelling or tenderness. Tympanic membrane is not injected, scarred, erythematous, retracted or bulging.     Left Ear: Tympanic membrane, ear canal and external ear normal. No drainage, swelling or tenderness. Tympanic membrane is not injected, scarred, erythematous, retracted or bulging.     Nose: No nasal deformity, septal deviation, mucosal edema or rhinorrhea.     Right Turbinates: Enlarged and swollen.     Left Turbinates: Enlarged and swollen.     Right Sinus: No maxillary sinus tenderness or frontal sinus tenderness.     Left Sinus: No maxillary sinus tenderness or frontal sinus tenderness.     Mouth/Throat:     Mouth: Mucous membranes are not pale and not dry.     Pharynx: Uvula midline.  Eyes:     General:        Right eye: No discharge.        Left eye: No discharge.     Conjunctiva/sclera: Conjunctivae normal.     Right eye: Right conjunctiva is not injected. No chemosis.    Left eye: Left conjunctiva is not injected. No chemosis.    Pupils: Pupils are equal, round, and reactive to light.  Cardiovascular:     Rate and Rhythm: Normal rate and regular rhythm.     Heart sounds: Normal heart sounds.  Pulmonary:     Effort: Pulmonary  effort is normal. No tachypnea, accessory muscle usage or respiratory distress.     Breath sounds: Normal breath sounds. No wheezing, rhonchi or rales.  Chest:     Chest wall: No tenderness.  Abdominal:     Tenderness: There is no abdominal tenderness. There is no guarding or rebound.     Comments: Tommy Hobbs does have a very tiny red area around where his injection took place.  No tenderness appreciated around it.  I do not appreciate any foreign bodies within the area.  No bleeding or oozing noted.  Lymphadenopathy:     Head:     Right side of head: No submandibular, tonsillar or occipital adenopathy.     Left side of head: No submandibular, tonsillar or occipital adenopathy.     Cervical: No cervical adenopathy.  Skin:    General: Skin is warm.     Capillary Refill: Capillary refill takes less than 2 seconds.     Coloration: Skin is not pale.     Findings: No abrasion, erythema, lesion, petechiae or rash. Rash is not papular, urticarial or vesicular.     Comments: Skin looks much better.  Neurological:     Mental Status: Tommy Hobbs is alert.  Psychiatric:        Behavior: Behavior is cooperative.      Diagnostic studies: none       Marty Shaggy, MD  [facility]        "

## 2024-01-15 NOTE — Patient Instructions (Addendum)
 1. Rash/possible atopic dermatitis - Continue with the Zyrtec (cetirizine) 10mg  twice daily to stay ahead of the itching.  - I am glad that we are getting this medication covered.  - We will send in triamcinolone  ointment to use twice daily as needed. - We will send in triamcinolone  cream to use twice daily as needed.    2.Allergic contact dermatitis due to other agents Final TRUE patch test reading done on 02/09/23: Positive to: Cl+ Me-Isothiazolinone Quaternium-15 Formaldehyde Diazolidinyl Urea Tixocortol-21-Pivalate Borderline to: Cobalt Dichloride Thimerosal  Thiuram Mix Imidazolidinyl Urea 2-Bromo-2-Nitropropane-1,3-diol Avoid items above.    Return if symptoms worsen or fail to improve. You can have the follow up appointment with Dr. Iva or a Nurse Practicioner (our Nurse Practitioners are excellent and always have Physician oversight!).    Please inform us  of any Emergency Department visits, hospitalizations, or changes in symptoms. Call us  before going to the ED for breathing or allergy symptoms since we might be able to fit you in for a sick visit. Feel free to contact us  anytime with any questions, problems, or concerns.  It was a pleasure to see you and your family again today!  Websites that have reliable patient information: 1. American Academy of Asthma, Allergy, and Immunology: www.aaaai.org 2. Food Allergy Research and Education (FARE): foodallergy.org 3. Mothers of Asthmatics: http://www.asthmacommunitynetwork.org 4. American College of Allergy, Asthma, and Immunology: www.acaai.org      Like us  on [employer] and Instagram for our latest updates!      A healthy democracy works best when [employer] participate! Make sure you are registered to vote! If you have moved or changed any of your contact information, you will need to get this updated before voting! Scan the QR codes below to learn more!

## 2024-01-15 NOTE — Telephone Encounter (Signed)
 Patient scheduled for OP MBS on 02/14/24 @ 1:00 at [facility]. RKEEL

## 2024-01-28 ENCOUNTER — Ambulatory Visit

## 2024-01-28 DIAGNOSIS — R1312 Dysphagia, oropharyngeal phase: Secondary | ICD-10-CM

## 2024-01-28 DIAGNOSIS — R1310 Dysphagia, unspecified: Secondary | ICD-10-CM

## 2024-01-28 DIAGNOSIS — R471 Dysarthria and anarthria: Secondary | ICD-10-CM

## 2024-01-28 DIAGNOSIS — R41841 Cognitive communication deficit: Secondary | ICD-10-CM

## 2024-01-28 NOTE — Patient Instructions (Addendum)
" ° °  [facility] Neurology [address], [address] [place],  [place]  [address]  To schedule a consultation or receive more information, please call [phone].    HEARING EVALUATION [facility] [id number] [place] [address]     "

## 2024-01-28 NOTE — Therapy (Signed)
 " OUTPATIENT SPEECH LANGUAGE PATHOLOGY EVALUATION   Patient Name: Tommy Hobbs MRN: [id number] DOB:[birth year], 84 y.o., male Today's Date: 01/28/2024  PCP: Tommy Sieving, MD REFERRING PROVIDER: Chandra Sieving, MD  END OF SESSION:    Past Medical History:  Diagnosis Date   Arthritis    Constipation    DDD (degenerative disc disease)    Dysrhythmia    history svt-clean cath 2004- Had SVT One Time.   GERD (gastroesophageal reflux disease)    Headache    History of BPH    History of kidney stones    x 1 passed   PTSD (post-traumatic stress disorder)    Wears dentures    full top   Past Surgical History:  Procedure Laterality Date   APPENDECTOMY  1977   CARDIAC CATHETERIZATION  2004   CATARACT EXTRACTION, BILATERAL Bilateral    CERVICAL FUSION  2011   CHOLECYSTECTOMY  1977   open   COLONOSCOPY     INGUINAL HERNIA REPAIR Right 1977   right   INGUINAL HERNIA REPAIR Left 08/04/2013   Procedure: OPEN LEFT INGUINAL HERNIA REPAIR;  Surgeon: Elon CHRISTELLA Pacini, MD;  Location: [facility];  Service: General;  Laterality: Left;   INSERTION OF MESH N/A 08/04/2013   Procedure: INSERTION OF MESH;  Surgeon: Elon CHRISTELLA Pacini, MD;  Location: [facility];  Service: General;  Laterality: N/A;   KNEE ARTHROSCOPY Left    KYPHOPLASTY N/A 08/17/2014   Procedure: CLEVE ONE;  Surgeon: Lamar Peaches, MD;  Location: [facility];  Service: Neurosurgery;  Laterality: N/A;   SUPRAPUBIC PROSTATECTOMY  2012   partial for severe BPH   Patient Active Problem List   Diagnosis Date Noted   Dysarthria 11/20/2023   Eosinophilia 03/27/2023   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   Allergic dermatitis 12/27/2022   Benign neoplasm of colon 06/16/2021   Elevated PSA 06/16/2021   Hypertrophy of prostate without urinary obstruction and other lower urinary tract symptoms (LUTS) 06/16/2021   Osteoarthritis 06/16/2021   Prostatitis, chronic 06/16/2021   Ventral hernia  06/16/2021   Vitamin D  deficiency 06/16/2021   Overweight (BMI 25.0-29.9) 08/30/2017   Essential hypertension 08/30/2017   Mixed hyperlipidemia 05/02/2017   GERD (gastroesophageal reflux disease) 01/25/2017   h/o Dysrhythmia 01/25/2017   History of BPH 01/25/2017   DDD (degenerative disc disease) 01/25/2017   h/o PTSD (post-traumatic stress disorder)- served in [place] 01/25/2017   Left inguinal hernia 07/17/2013   Other ill-defined and unknown causes of morbidity and mortality 01/09/1969    ONSET DATE: May 2025 -  script 11/20/23  REFERRING DIAG: Dysarthria  THERAPY DIAG:  No diagnosis found.  Rationale for Evaluation and Treatment: Rehabilitation  SUBJECTIVE:   SUBJECTIVE STATEMENT: I think some of this has been going on since April.  Pt accompanied by: significant other Tommy Hobbs  PERTINENT HISTORY: HPI   From Dr.Olson- 01/01/24 Subjective - Follow up for progressively worsening slurred/slowed speech over the last 6 months. Reports the speech is worse today and yesterday, and that it fluctuates. Stepson who accompanied him to the visit also notes the speech has gotten progressively worse. [name] believes his cognition is intact, but he has difficulty expressing his thoughts. He also reports some new issues with balance, describing him as a little bit shakier. - Denies new issues or concerns.  PAIN:  Are you having pain? No  FALLS: Has patient fallen in last 6 months?  Comment: did not ask due to time constraints  LIVING ENVIRONMENT: Lives with:  lives with their spouse Lives in: House/apartment  PLOF:  Level of assistance: Independent with ADLs, Independent with IADLs Employment: Retired  PATIENT GOALS: Improve speaking ability  OBJECTIVE:  Note: Objective measures were completed at Evaluation unless otherwise noted.  DIAGNOSTIC FINDINGS:  MR BRAIN WO CONT - 12/14/23 IMPRESSION: 1. No acute intracranial abnormality. 2. Age-related atrophy and moderate  diffuse cerebral white matter disease.   [facility] AT [facility] Provider Note CSN: [id number] Arrival date & time: 05/24/23  1857 History    Chief Complaint  Patient presents with   Aphasia  Tommy Hobbs is a 84 y.o. male.   84 year old male with past medical history of hypertension hyperlipidemia presenting to the emergency department today with slurred speech.  This been going on now for the past week or so.  The patient states that he thinks that this is due to starting Dupixent  recently.  He states that he has been on this for about 5 weeks.  He denies any focal weakness, numbness, or tingling.  He states that he is otherwise feeling well.  He does report some urinary frequency and urgency but states that this is not totally out of the ordinary for him.  He came to the ER today after calling his doctor and being referred here for further evaluation.     Dr. CHANDRA - 01/01/24: Dysarthria Assessment & Plan: -Worsening speech over 6 months with no other significant neurologic deficits. Would classify it as more dysarthric than aphasic. Speech is slowed but could be due to word finding difficulties.  Cognition and memory appear intact per self-report and family. MRI of the brain was unremarkable for acute process, showing only age-related diffuse cerebral white matter changes. Physical exam today is largely non-focal. Given the negative MRI, the etiology is unclear. Primary progressive aphasia vs. Atypical initial symptoms of dementia? - Will check labs to rule out other contributing factors,  - Refer to Neurology for further evaluation. - Continue with scheduled Speech Therapy appointment on 01/07/2024. - Follow up here in 2 months.  COGNITION: Overall cognitive status: not assessed at this time due to time constraints. Pt states his problem solving abilities have declined in the last 3-6 months.  Areas of impairment:  Executive function: Impaired:  Problem solving Functional deficits: Can't problem solve well to solve computer issues, pt states  AUDITORY COMPREHENSION: Overall auditory comprehension: Appears intact YES/NO questions: Appears intact Following directions: Appears intact Conversation: Complex and Moderately Complex Interfering components: none  VERBAL EXPRESSION: Level of generative/spontaneous verbalization: conversation Automatic speech: day of week: intact and month of year: intact  Repetition: inconsistent articulatory errors that do not appear to be aphasic in nature Naming: Confrontation: 100% Pragmatics: Appears intact Comments: Pt withOUT any appreciable aphasia in complex conversation  WRITTEN EXPRESSION: Written expression: Not tested  MOTOR SPEECH: Overall motor speech: impaired Level of impairment: Word, Phrase, Sentence, and Conversation Respiration: thoracic breathing and diaphragmatic/abdominal breathing Phonation: normal Resonance: hypernasality (mild, intermittent) Articulation: Impaired: word, phrase, sentence, and conversation Intelligibility: Intelligibility reduced: 93% today; functionally, SLP had to ask pt to repeat x5 today in this evaluation. When he did so he overarticulated which improved speech intelligibility. Motor planning: Appears intact Motor speech errors: aware and inconsistent Interfering components: none Effective technique: slow rate and over articulate COMMENTS: DDK rate was slower than WNL. During this task he exhibited imprecise articulation including abnormal prosodic stress, and abnormal/incoordinated voicing of consonants (e.g., /d/ for /t), including /a/ in final voiceless consonant positions -  e.g., /papa/ for pop', fisha for fish. These articlatory errors also occurred in conversation, more often in multisyllablic words.  ORAL MOTOR EXAMINATION: Overall status: Impaired: Labial: Bilateral (Strength and Coordination) Lingual: Left (Symmetry) Bilateral  (Strength and Coordination) Facial: Right (Symmetry) Velum: ROM and Coordination Comments: mild lingual strength deficits; ; pt with left facial symmetry differences; pt's /a/ to assess velum was with suspected open velopharyngeal port.  RECOMMENDATIONS FROM OBJECTIVE SWALLOW STUDY:  none noted in chart MBS is recommended due to pt's and wife's endorsement of coughing with meals multiple times per day.   PATIENT REPORTED OUTCOME MEASURES (PROM): EAT-10:   and Communication Effectiveness Survey: provided in first session                                                                                                                            TREATMENT DATE:   01/28/24: SLP told pt of very loud speech needed for pt to hear SLP and suggested he see an audiologist for hearing evaluation. Pt asked SLP if there was recommendation. SLP provided this to pt.  Pt MBS scheduled at [facility] 02/14/24. Pt and wife are not completely satisfied with wait at [facility] so SLP suggested [facility] as another option. They took contact information for that clinic.  Pt states that his speech has been variable between worse and not bad since evaluation. SLP worked with pt today on overarticuation/speech compensations. When reading sentences of 8-9 words and 10+ words pt initially required rare min-mod A faded to independent. Wife understood the meaning of 100% of pt's message when reading sentences after the first 4. In sentence responses, pt used overarticulation 90% of the time. SLP provided pt homework for overarticulation, BID 10-15 mintues/day  01/15/24: Today SLP worked with pt to improve articulation by teaching overarticulation as a compensatory strategy. When SLP told pt to overarticulate and provided model, when asked pt about his occupation and other mod-complex topics for 2 minutes, articulation improved to 100%. Romero agreed pt's articulation sounded markedly improved with overarticulation. Secondly, pt was  taught rationale for smaller bites and sips, with only solids or only liquids in oral cavity at one time, with meals. SLP recommends MBS to assess oropharyngeal swallow.    PATIENT EDUCATION: Education details: see treatment date above Person educated: Patient and Spouse Education method: Explanation, Demonstration, Verbal cues, and Handouts Education comprehension: verbalized understanding, returned demonstration, verbal cues required, and needs further education   GOALS: Goals reviewed with patient? Yes, in general  SHORT TERM GOALS: Target date: 02/15/24  Pt will undergo MBS due to coughing with meals daily Baseline: Goal status: INITIAL  2.  Pt will use trained compensation/s for speech intelligibility 90% of the time in multi-sentence responses in two sessions Baseline:  Goal status: INITIAL  3.  Pt will use trained compensations for swallowing deficits with POs with mod I in 2 sessions Baseline:  Goal status: INITIAL  4.  Pt will demo compensations for problem  solving (pre-planning, organizational compensations, etc) in or between 2 sessions Baseline:  Goal status: INITIAL   LONG TERM GOALS: Target date: 03/14/24  Pt will improve one or both PROMs Baseline:  Goal status: INITIAL  2.  Pt will use trained compensation/s for speech intelligibility 90% of the time in 10 minutes conversation in two sessions Baseline:  Goal status: INITIAL  3.  Pt will use trained compensations for swallowing deficits with POs in 2 sessions Baseline:  Goal status: INITIAL   ASSESSMENT:  CLINICAL IMPRESSION: Patient is a 84 y.o. M who was seen today for treatment of speech intelligibility after gradually declining speech intelligibility since approx spring 2025, in light of MRI results above. See treatment date above for today's date for further details on today's session.  On eval date, pt reported severity of slurring varies from day to day. He also has daily coughing with meals,  and cognitive (decr'd ability to problem solve) and balance changes. Pt also endorses more crying spells in the last 6 months. Pt has PTSD but these spells have occurred much more often in the last 6-9 months than prior to this time. His current status with his speech is frustrating to him, and his coughing with meals is concerning. Lastly he reports he gets frustrated when he can no longer problem solve with his computer as he used to be able to do. Pt's first 2-3 visits will be every other week due to pt will now use speech and swallowing compensations but requires a MBS for further treatment for swallowing which will take 3-4 weeks to schedule and receive. SLP encouraged pt to call neurological practice and get appointment scheduled rather than wait for the office to call them.    OBJECTIVE IMPAIRMENTS: include executive functioning, dysarthria, and dysphagia. These impairments are limiting patient from household responsibilities, ADLs/IADLs, effectively communicating at home and in community, and safety when swallowing. Factors affecting potential to achieve goals and functional outcome are uncertain dx at this time. Patient will benefit from skilled SLP services to address above impairments and improve overall function.  REHAB POTENTIAL: Good  PLAN:  SLP FREQUENCY: 1x/week  SLP DURATION: 10 weeks; first two-three sessions will be every other week due to uncertain diagnosis and need for MBS  PLANNED INTERVENTIONS: Aspiration precaution training, Pharyngeal strengthening exercises, Diet toleration management , Environmental controls, Trials of upgraded texture/liquids, Cognitive reorganization, Internal/external aids, Oral motor exercises, Multimodal communication approach, SLP instruction and feedback, Compensatory strategies, Patient/family education, [phone] Treatment of speech (30 or 45 min) , and [id number] Treatment of swallowing function    Joanne Brander, CCC-SLP 01/28/2024, 9:08  AM      "

## 2024-02-13 ENCOUNTER — Ambulatory Visit

## 2024-02-13 DIAGNOSIS — R1312 Dysphagia, oropharyngeal phase: Secondary | ICD-10-CM

## 2024-02-13 DIAGNOSIS — R471 Dysarthria and anarthria: Secondary | ICD-10-CM

## 2024-02-13 DIAGNOSIS — R41841 Cognitive communication deficit: Secondary | ICD-10-CM

## 2024-02-13 NOTE — Therapy (Deleted)
 " OUTPATIENT SPEECH LANGUAGE PATHOLOGY EVALUATION   Patient Name: DEMPSY Hobbs MRN: [id number] DOB:09/06/1940, 84 y.o., male Today's Date: 02/13/2024  PCP: Chandra Sieving, MD REFERRING PROVIDER: Chandra Sieving, MD  END OF SESSION:    Past Medical History:  Diagnosis Date   Arthritis    Constipation    DDD (degenerative disc disease)    Dysrhythmia    history svt-clean cath 2004- Had SVT One Time.   GERD (gastroesophageal reflux disease)    Headache    History of BPH    History of kidney stones    x 1 passed   PTSD (post-traumatic stress disorder)    Wears dentures    full top   Past Surgical History:  Procedure Laterality Date   APPENDECTOMY  1977   CARDIAC CATHETERIZATION  2004   CATARACT EXTRACTION, BILATERAL Bilateral    CERVICAL FUSION  2011   CHOLECYSTECTOMY  1977   open   COLONOSCOPY     INGUINAL HERNIA REPAIR Right 1977   right   INGUINAL HERNIA REPAIR Left 08/04/2013   Procedure: OPEN LEFT INGUINAL HERNIA REPAIR;  Surgeon: Tommy CHRISTELLA Pacini, MD;  Location: [facility];  Service: General;  Laterality: Left;   INSERTION OF MESH N/A 08/04/2013   Procedure: INSERTION OF MESH;  Surgeon: Tommy CHRISTELLA Pacini, MD;  Location: [facility];  Service: General;  Laterality: N/A;   KNEE ARTHROSCOPY Left    KYPHOPLASTY N/A 08/17/2014   Procedure: CLEVE ONE;  Surgeon: Lamar Peaches, MD;  Location: [facility];  Service: Neurosurgery;  Laterality: N/A;   SUPRAPUBIC PROSTATECTOMY  2012   partial for severe BPH   Patient Active Problem List   Diagnosis Date Noted   Dysarthria 11/20/2023   Eosinophilia 03/27/2023   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   Allergic dermatitis 12/27/2022   Benign neoplasm of colon 06/16/2021   Elevated PSA 06/16/2021   Hypertrophy of prostate without urinary obstruction and other lower urinary tract symptoms (LUTS) 06/16/2021   Osteoarthritis 06/16/2021   Prostatitis, chronic 06/16/2021   Ventral hernia  06/16/2021   Vitamin D  deficiency 06/16/2021   Overweight (BMI 25.0-29.9) 08/30/2017   Essential hypertension 08/30/2017   Mixed hyperlipidemia 05/02/2017   GERD (gastroesophageal reflux disease) 01/25/2017   h/o Dysrhythmia 01/25/2017   History of BPH 01/25/2017   DDD (degenerative disc disease) 01/25/2017   h/o PTSD (post-traumatic stress disorder)- served in [place] 01/25/2017   Left inguinal hernia 07/17/2013   Other ill-defined and unknown causes of morbidity and mortality 01/09/1969    ONSET DATE: May 2025 -  script 11/20/23  REFERRING DIAG: Dysarthria  THERAPY DIAG:  Dysarthria and anarthria  Dysphagia, oropharyngeal phase  Cognitive communication deficit  Rationale for Evaluation and Treatment: Rehabilitation  SUBJECTIVE:   SUBJECTIVE STATEMENT: I think some of this has been going on since April.  Pt accompanied by: significant other Janice  PERTINENT HISTORY: HPI   From Dr.Olson- 01/01/24 Subjective - Follow up for progressively worsening slurred/slowed speech over the last 6 months. Reports the speech is worse today and yesterday, and that it fluctuates. Stepson who accompanied him to the visit also notes the speech has gotten progressively worse. [name] believes his cognition is intact, but he has difficulty expressing his thoughts. He also reports some new issues with balance, describing him as a little bit shakier. - Denies new issues or concerns.  PAIN:  Are you having pain? No  FALLS: Has patient fallen in last 6 months?  Comment: did not ask due  to time constraints  LIVING ENVIRONMENT: Lives with: lives with their spouse Lives in: House/apartment  PLOF:  Level of assistance: Independent with ADLs, Independent with IADLs Employment: Retired  PATIENT GOALS: Improve speaking ability  OBJECTIVE:  Note: Objective measures were completed at Evaluation unless otherwise noted.  DIAGNOSTIC FINDINGS:  MR BRAIN WO CONT -  12/14/23 IMPRESSION: 1. No acute intracranial abnormality. 2. Age-related atrophy and moderate diffuse cerebral white matter disease.   [place] [facility] AT [facility] Provider Note CSN: [id number] Arrival date & time: 05/24/23  1857 History    Chief Complaint  Patient presents with   Aphasia  Tommy Hobbs is a 84 y.o. male.   84 year old male with past medical history of hypertension hyperlipidemia presenting to the emergency department today with slurred speech.  This been going on now for the past week or so.  The patient states that he thinks that this is due to starting Dupixent  recently.  He states that he has been on this for about 5 weeks.  He denies any focal weakness, numbness, or tingling.  He states that he is otherwise feeling well.  He does report some urinary frequency and urgency but states that this is not totally out of the ordinary for him.  He came to the ER today after calling his doctor and being referred here for further evaluation.     Dr. CHANDRA - 01/01/24: Dysarthria Assessment & Plan: -Worsening speech over 6 months with no other significant neurologic deficits. Would classify it as more dysarthric than aphasic. Speech is slowed but could be due to word finding difficulties.  Cognition and memory appear intact per self-report and family. MRI of the brain was unremarkable for acute process, showing only age-related diffuse cerebral white matter changes. Physical exam today is largely non-focal. Given the negative MRI, the etiology is unclear. Primary progressive aphasia vs. Atypical initial symptoms of dementia? - Will check labs to rule out other contributing factors,  - Refer to Neurology for further evaluation. - Continue with scheduled Speech Therapy appointment on 01/07/2024. - Follow up here in 2 months.  COGNITION: Overall cognitive status: not assessed at this time due to time constraints. Pt states his problem solving abilities  have declined in the last 3-6 months.  Areas of impairment:  Executive function: Impaired: Problem solving Functional deficits: Can't problem solve well to solve computer issues, pt states  AUDITORY COMPREHENSION: Overall auditory comprehension: Appears intact YES/NO questions: Appears intact Following directions: Appears intact Conversation: Complex and Moderately Complex Interfering components: none  VERBAL EXPRESSION: Level of generative/spontaneous verbalization: conversation Automatic speech: day of week: intact and month of year: intact  Repetition: inconsistent articulatory errors that do not appear to be aphasic in nature Naming: Confrontation: 100% Pragmatics: Appears intact Comments: Pt withOUT any appreciable aphasia in complex conversation  WRITTEN EXPRESSION: Written expression: Not tested  MOTOR SPEECH: Overall motor speech: impaired Level of impairment: Word, Phrase, Sentence, and Conversation Respiration: thoracic breathing and diaphragmatic/abdominal breathing Phonation: normal Resonance: hypernasality (mild, intermittent) Articulation: Impaired: word, phrase, sentence, and conversation Intelligibility: Intelligibility reduced: 93% today; functionally, SLP had to ask pt to repeat x5 today in this evaluation. When he did so he overarticulated which improved speech intelligibility. Motor planning: Appears intact Motor speech errors: aware and inconsistent Interfering components: none Effective technique: slow rate and over articulate COMMENTS: DDK rate was slower than WNL. During this task he exhibited imprecise articulation including abnormal prosodic stress, and abnormal/incoordinated voicing of consonants (e.g., /d/ for /t),  including /a/ in final voiceless consonant positions - e.g., /papa/ for pop', fisha for fish. These articlatory errors also occurred in conversation, more often in multisyllablic words.  ORAL MOTOR EXAMINATION: Overall status:  Impaired: Labial: Bilateral (Strength and Coordination) Lingual: Left (Symmetry) Bilateral (Strength and Coordination) Facial: Right (Symmetry) Velum: ROM and Coordination Comments: mild lingual strength deficits; ; pt with left facial symmetry differences; pt's /a/ to assess velum was with suspected open velopharyngeal port.  RECOMMENDATIONS FROM OBJECTIVE SWALLOW STUDY:  none noted in chart MBS is recommended due to pt's and wife's endorsement of coughing with meals multiple times per day.   PATIENT REPORTED OUTCOME MEASURES (PROM): EAT-10:   and Communication Effectiveness Survey: provided in first session                                                                                                                            TREATMENT DATE:   01/28/24: SLP told pt of very loud speech needed for pt to hear SLP and suggested he see an audiologist for hearing evaluation. Pt asked SLP if there was recommendation. SLP provided this to pt.  Pt MBS scheduled at [facility] 02/14/24. Pt and wife are not completely satisfied with wait at [facility] so SLP suggested [facility] as another option. They took contact information for that clinic.  Pt states that his speech has been variable between worse and not bad since evaluation. SLP worked with pt today on overarticuation/speech compensations. When reading sentences of 8-9 words and 10+ words pt initially required rare min-mod A faded to independent. Wife understood the meaning of 100% of pt's message when reading sentences after the first 4. In sentence responses, pt used overarticulation 90% of the time. SLP provided pt homework for overarticulation, BID 10-15 mintues/day  01/15/24: Today SLP worked with pt to improve articulation by teaching overarticulation as a compensatory strategy. When SLP told pt to overarticulate and provided model, when asked pt about his occupation and other mod-complex topics for 2 minutes, articulation improved to 100%.  Romero agreed pt's articulation sounded markedly improved with overarticulation. Secondly, pt was taught rationale for smaller bites and sips, with only solids or only liquids in oral cavity at one time, with meals. SLP recommends MBS to assess oropharyngeal swallow.    PATIENT EDUCATION: Education details: see treatment date above Person educated: Patient and Spouse Education method: Explanation, Demonstration, Verbal cues, and Handouts Education comprehension: verbalized understanding, returned demonstration, verbal cues required, and needs further education   GOALS: Goals reviewed with patient? Yes, in general  SHORT TERM GOALS: Target date: 02/15/24  Pt will undergo MBS due to coughing with meals daily Baseline: Goal status: INITIAL  2.  Pt will use trained compensation/s for speech intelligibility 90% of the time in multi-sentence responses in two sessions Baseline:  Goal status: INITIAL  3.  Pt will use trained compensations for swallowing deficits with POs with mod I in 2 sessions Baseline:  Goal status: INITIAL  4.  Pt will demo compensations for problem solving (pre-planning, organizational compensations, etc) in or between 2 sessions Baseline:  Goal status: INITIAL   LONG TERM GOALS: Target date: 03/14/24  Pt will improve one or both PROMs Baseline:  Goal status: INITIAL  2.  Pt will use trained compensation/s for speech intelligibility 90% of the time in 10 minutes conversation in two sessions Baseline:  Goal status: INITIAL  3.  Pt will use trained compensations for swallowing deficits with POs in 2 sessions Baseline:  Goal status: INITIAL   ASSESSMENT:  CLINICAL IMPRESSION: Patient is a 84 y.o. M who was seen today for treatment of speech intelligibility after gradually declining speech intelligibility since approx spring 2025, in light of MRI results above. See treatment date above for today's date for further details on today's session.  On eval date,  pt reported severity of slurring varies from day to day. He also has daily coughing with meals, and cognitive (decr'd ability to problem solve) and balance changes. Pt also endorses more crying spells in the last 6 months. Pt has PTSD but these spells have occurred much more often in the last 6-9 months than prior to this time. His current status with his speech is frustrating to him, and his coughing with meals is concerning. Lastly he reports he gets frustrated when he can no longer problem solve with his computer as he used to be able to do. Pt's first 2-3 visits will be every other week due to pt will now use speech and swallowing compensations but requires a MBS for further treatment for swallowing which will take 3-4 weeks to schedule and receive. SLP encouraged pt to call neurological practice and get appointment scheduled rather than wait for the office to call them.    OBJECTIVE IMPAIRMENTS: include executive functioning, dysarthria, and dysphagia. These impairments are limiting patient from household responsibilities, ADLs/IADLs, effectively communicating at home and in community, and safety when swallowing. Factors affecting potential to achieve goals and functional outcome are uncertain dx at this time. Patient will benefit from skilled SLP services to address above impairments and improve overall function.  REHAB POTENTIAL: Good  PLAN:  SLP FREQUENCY: 1x/week  SLP DURATION: 10 weeks; first two-three sessions will be every other week due to uncertain diagnosis and need for MBS  PLANNED INTERVENTIONS: Aspiration precaution training, Pharyngeal strengthening exercises, Diet toleration management , Environmental controls, Trials of upgraded texture/liquids, Cognitive reorganization, Internal/external aids, Oral motor exercises, Multimodal communication approach, SLP instruction and feedback, Compensatory strategies, Patient/family education, [phone] Treatment of speech (30 or 45 min) , and  [id number] Treatment of swallowing function    Kramer Hanrahan, CCC-SLP 02/13/2024, 10:57 AM      "

## 2024-02-13 NOTE — Therapy (Signed)
 [facility] [address], [address] [place], [place], [address] Phone: [phone]   Fax:  [phone]  Patient Details  Name: RYU CERRETA MRN: [id number] Date of Birth: September 18, 1940 Referring Provider:  Chandra Toribio POUR, MD  Encounter Date: 02/13/2024  ST - ARRIVE CANCEL SLP learned pt's MBS is tomorrow, and that pt neuro consult is 05/26/24. Wife and pt still would like to pursue a neuro consult sooner than 05/26/24 and SLP inquired about if pt has inquired about other neuro practices' availability. Wife commented that [facility] would not make an appointment without a referral from a MD but neither pt nor she had inquired of PCP for this referral. SLP strongly encouraged pt and wife if they want to seek a neuro consult sooner than 05/26/24 they may want to contact PCP and inquire about referral to a different neuro practice. They acknowledged understanding.  SLP will cont with pt next week after MBS.   [facility], CCC-SLP 02/13/2024, 4:34 PM  [place] [facility] [address], [address] [place], [place], [address] Phone: [phone]   Fax:  [phone]

## 2024-02-14 ENCOUNTER — Ambulatory Visit (HOSPITAL_COMMUNITY)
Admission: RE | Admit: 2024-02-14 | Discharge: 2024-02-14 | Disposition: A | Source: Ambulatory Visit | Attending: Family Medicine

## 2024-02-14 DIAGNOSIS — R471 Dysarthria and anarthria: Secondary | ICD-10-CM

## 2024-02-14 DIAGNOSIS — R1312 Dysphagia, oropharyngeal phase: Secondary | ICD-10-CM

## 2024-02-14 DIAGNOSIS — R1310 Dysphagia, unspecified: Secondary | ICD-10-CM

## 2024-02-14 DIAGNOSIS — R41841 Cognitive communication deficit: Secondary | ICD-10-CM

## 2024-02-14 NOTE — Progress Notes (Signed)
 Modified Barium Swallow Study  Patient Details  Name: Tommy Hobbs MRN: [id number] Date of Birth: [birth year]  Today's Date: 02/14/2024  Modified Barium Swallow completed.  Full report located under Chart Review in the Imaging Section.  History of Present Illness 84 yr old seen for outpatient MBS accompanied by his wife. He denies coughing with po's but affrims pharyngeal globus sensation with variouis foods. No pneumonia. PMH: ACDF years ago, GERD, DDD, neurological changes past 6 months (dysarthria, difficulty expressive language), MRI negative,awaiting neurology consult. ST treatment at outpatient with Tommy Hobbs.   Clinical Impression Pt exhibits mild oropharyngeal dysphagia marked by decreased control, lingual residue with thin and mildly prolonged mastication (peanuts brought from home). Pharyngeally there was delayed initiation to pyriform sinues and incomplete laryngeal closure with partial epiglottic deflection resulting in reduced airway protection durig the swallow. This allows trace amount of aspiration with thin (silent) with cued cough eventually expelling barium. Delayed swallow with nectar to pyriforms and  vallecuale with honey with airway protection present. Residue was mild and spontaneous second swallows effectively reduced/cleared. Chin tuck consistently assisted to contain liquid in oral cavity and valleculae preventing further airway invasion. Barium pill hesitated mid esophagus with puree assisting in transit. Recommend regular, and chin tuck with liquids, second swallows, throat clears and pills wholw with puree. Pt typically drinks water  and encouraged mobility and oral hygiene. Factors that may increase risk of adverse event in presence of aspiration Tommy Hobbs & Tommy Hobbs 2021):    Swallow Evaluation Recommendations Recommendations: PO diet PO Diet Recommendation: Regular;Thin liquids (Level 0) Liquid Administration via: Cup;Straw Medication Administration: Whole meds  with puree Supervision: Patient able to self-feed Swallowing strategies  : Slow rate;Small bites/sips;Multiple dry swallows after each bite/sip;Chin tuck;Clear throat intermittently Postural changes: Position pt fully upright for meals;Stay upright 30-60 min after meals Oral care recommendations: Oral care BID (2x/day)      Tommy Hobbs 02/14/2024,3:16 PM

## 2024-02-25 ENCOUNTER — Ambulatory Visit

## 2024-03-03 ENCOUNTER — Ambulatory Visit: Admitting: Family Medicine

## 2024-04-03 ENCOUNTER — Ambulatory Visit: Admitting: Neurology

## 2024-05-26 ENCOUNTER — Ambulatory Visit: Admitting: Neurology
